# Patient Record
Sex: Male | Born: 1955 | Race: White | Hispanic: No | Smoking: Former smoker
Health system: Southern US, Community
[De-identification: ages and names within clinical notes are randomized; demographics above are authoritative.]

## PROBLEM LIST (undated history)

## (undated) VITALS — BP 95/65 | HR 109 | Temp 97.7°F | Resp 16 | Ht 70.0 in | Wt 170.0 lb

## (undated) VITALS — BP 120/80 | HR 97 | Temp 97.0°F | Resp 20 | Ht 70.5 in | Wt 166.0 lb

## (undated) DIAGNOSIS — F209 Schizophrenia, unspecified: Secondary | ICD-10-CM

## (undated) DIAGNOSIS — F32A Depression, unspecified: Secondary | ICD-10-CM

## (undated) DIAGNOSIS — F329 Major depressive disorder, single episode, unspecified: Secondary | ICD-10-CM

## (undated) DIAGNOSIS — F1210 Cannabis abuse, uncomplicated: Secondary | ICD-10-CM

## (undated) DIAGNOSIS — F1110 Opioid abuse, uncomplicated: Secondary | ICD-10-CM

## (undated) DIAGNOSIS — R911 Solitary pulmonary nodule: Secondary | ICD-10-CM

## (undated) DIAGNOSIS — F319 Bipolar disorder, unspecified: Secondary | ICD-10-CM

## (undated) HISTORY — DX: Solitary pulmonary nodule: R91.1

## (undated) HISTORY — PX: NO PAST SURGERIES: SHX2092

## (undated) HISTORY — PX: WRIST SURGERY: SHX841

---

## 2001-07-10 ENCOUNTER — Emergency Department (HOSPITAL_COMMUNITY): Admission: EM | Admit: 2001-07-10 | Discharge: 2001-07-10 | Payer: Self-pay | Admitting: *Deleted

## 2001-08-20 ENCOUNTER — Emergency Department (HOSPITAL_COMMUNITY): Admission: EM | Admit: 2001-08-20 | Discharge: 2001-08-20 | Payer: Self-pay | Admitting: Emergency Medicine

## 2003-02-11 ENCOUNTER — Emergency Department (HOSPITAL_COMMUNITY): Admission: EM | Admit: 2003-02-11 | Discharge: 2003-02-11 | Payer: Self-pay | Admitting: Emergency Medicine

## 2003-02-20 ENCOUNTER — Emergency Department (HOSPITAL_COMMUNITY): Admission: EM | Admit: 2003-02-20 | Discharge: 2003-02-20 | Payer: Self-pay | Admitting: Emergency Medicine

## 2003-11-01 ENCOUNTER — Inpatient Hospital Stay (HOSPITAL_COMMUNITY): Admission: EM | Admit: 2003-11-01 | Discharge: 2003-11-04 | Payer: Self-pay | Admitting: Psychiatry

## 2003-11-07 ENCOUNTER — Emergency Department (HOSPITAL_COMMUNITY): Admission: EM | Admit: 2003-11-07 | Discharge: 2003-11-07 | Payer: Self-pay | Admitting: Internal Medicine

## 2004-01-15 ENCOUNTER — Ambulatory Visit (HOSPITAL_COMMUNITY): Admission: RE | Admit: 2004-01-15 | Discharge: 2004-01-15 | Payer: Self-pay | Admitting: Family Medicine

## 2004-01-28 ENCOUNTER — Ambulatory Visit (HOSPITAL_COMMUNITY): Admission: RE | Admit: 2004-01-28 | Discharge: 2004-01-28 | Payer: Self-pay | Admitting: Family Medicine

## 2004-05-02 ENCOUNTER — Emergency Department (HOSPITAL_COMMUNITY): Admission: EM | Admit: 2004-05-02 | Discharge: 2004-05-03 | Payer: Self-pay | Admitting: Emergency Medicine

## 2004-05-07 ENCOUNTER — Emergency Department (HOSPITAL_COMMUNITY): Admission: EM | Admit: 2004-05-07 | Discharge: 2004-05-07 | Payer: Self-pay | Admitting: Emergency Medicine

## 2004-05-10 ENCOUNTER — Emergency Department (HOSPITAL_COMMUNITY): Admission: EM | Admit: 2004-05-10 | Discharge: 2004-05-10 | Payer: Self-pay

## 2004-05-14 ENCOUNTER — Emergency Department (HOSPITAL_COMMUNITY): Admission: EM | Admit: 2004-05-14 | Discharge: 2004-05-14 | Payer: Self-pay | Admitting: Emergency Medicine

## 2004-07-18 ENCOUNTER — Emergency Department (HOSPITAL_COMMUNITY): Admission: EM | Admit: 2004-07-18 | Discharge: 2004-07-19 | Payer: Self-pay | Admitting: Emergency Medicine

## 2004-07-21 ENCOUNTER — Inpatient Hospital Stay (HOSPITAL_COMMUNITY): Admission: RE | Admit: 2004-07-21 | Discharge: 2004-07-26 | Payer: Self-pay | Admitting: Psychiatry

## 2004-07-21 ENCOUNTER — Ambulatory Visit: Payer: Self-pay | Admitting: Psychiatry

## 2004-09-16 ENCOUNTER — Ambulatory Visit: Payer: Self-pay | Admitting: Family Medicine

## 2004-12-15 ENCOUNTER — Ambulatory Visit: Payer: Self-pay | Admitting: Family Medicine

## 2004-12-16 ENCOUNTER — Ambulatory Visit: Payer: Self-pay | Admitting: Psychiatry

## 2004-12-16 ENCOUNTER — Inpatient Hospital Stay (HOSPITAL_COMMUNITY): Admission: EM | Admit: 2004-12-16 | Discharge: 2004-12-21 | Payer: Self-pay | Admitting: Psychiatry

## 2005-01-05 ENCOUNTER — Encounter: Payer: Self-pay | Admitting: Emergency Medicine

## 2005-01-05 ENCOUNTER — Inpatient Hospital Stay (HOSPITAL_COMMUNITY): Admission: AD | Admit: 2005-01-05 | Discharge: 2005-01-10 | Payer: Self-pay | Admitting: Psychiatry

## 2005-01-12 ENCOUNTER — Ambulatory Visit: Payer: Self-pay | Admitting: Family Medicine

## 2005-02-17 ENCOUNTER — Ambulatory Visit: Payer: Self-pay | Admitting: Family Medicine

## 2005-02-20 ENCOUNTER — Emergency Department (HOSPITAL_COMMUNITY): Admission: EM | Admit: 2005-02-20 | Discharge: 2005-02-20 | Payer: Self-pay | Admitting: Emergency Medicine

## 2005-03-07 ENCOUNTER — Ambulatory Visit: Payer: Self-pay | Admitting: Family Medicine

## 2005-03-10 ENCOUNTER — Emergency Department (HOSPITAL_COMMUNITY): Admission: EM | Admit: 2005-03-10 | Discharge: 2005-03-11 | Payer: Self-pay | Admitting: Emergency Medicine

## 2005-04-16 ENCOUNTER — Emergency Department (HOSPITAL_COMMUNITY): Admission: EM | Admit: 2005-04-16 | Discharge: 2005-04-16 | Payer: Self-pay | Admitting: *Deleted

## 2005-06-01 ENCOUNTER — Ambulatory Visit: Payer: Self-pay | Admitting: Family Medicine

## 2005-07-30 ENCOUNTER — Emergency Department (HOSPITAL_COMMUNITY): Admission: EM | Admit: 2005-07-30 | Discharge: 2005-07-30 | Payer: Self-pay | Admitting: Emergency Medicine

## 2005-08-01 ENCOUNTER — Ambulatory Visit: Payer: Self-pay | Admitting: Family Medicine

## 2005-08-10 ENCOUNTER — Emergency Department (HOSPITAL_COMMUNITY): Admission: EM | Admit: 2005-08-10 | Discharge: 2005-08-10 | Payer: Self-pay | Admitting: Emergency Medicine

## 2005-08-11 ENCOUNTER — Emergency Department (HOSPITAL_COMMUNITY): Admission: EM | Admit: 2005-08-11 | Discharge: 2005-08-11 | Payer: Self-pay | Admitting: Emergency Medicine

## 2005-08-19 ENCOUNTER — Emergency Department (HOSPITAL_COMMUNITY): Admission: EM | Admit: 2005-08-19 | Discharge: 2005-08-20 | Payer: Self-pay | Admitting: Emergency Medicine

## 2005-09-30 ENCOUNTER — Ambulatory Visit: Payer: Self-pay | Admitting: Family Medicine

## 2005-11-02 ENCOUNTER — Inpatient Hospital Stay (HOSPITAL_COMMUNITY): Admission: AD | Admit: 2005-11-02 | Discharge: 2005-11-04 | Payer: Self-pay | Admitting: Psychiatry

## 2005-11-02 ENCOUNTER — Ambulatory Visit: Payer: Self-pay | Admitting: Psychiatry

## 2005-11-30 ENCOUNTER — Emergency Department (HOSPITAL_COMMUNITY): Admission: EM | Admit: 2005-11-30 | Discharge: 2005-11-30 | Payer: Self-pay | Admitting: Emergency Medicine

## 2006-12-18 ENCOUNTER — Ambulatory Visit (HOSPITAL_COMMUNITY): Payer: Self-pay | Admitting: Psychiatry

## 2007-01-05 ENCOUNTER — Ambulatory Visit (HOSPITAL_COMMUNITY): Payer: Self-pay | Admitting: Psychiatry

## 2007-02-02 ENCOUNTER — Ambulatory Visit (HOSPITAL_COMMUNITY): Payer: Self-pay | Admitting: Psychiatry

## 2007-04-04 ENCOUNTER — Ambulatory Visit (HOSPITAL_COMMUNITY): Payer: Self-pay | Admitting: Psychiatry

## 2007-06-08 ENCOUNTER — Ambulatory Visit (HOSPITAL_COMMUNITY): Payer: Self-pay | Admitting: Psychiatry

## 2007-08-08 ENCOUNTER — Ambulatory Visit (HOSPITAL_COMMUNITY): Payer: Self-pay | Admitting: Psychiatry

## 2007-08-15 ENCOUNTER — Ambulatory Visit (HOSPITAL_COMMUNITY): Payer: Self-pay | Admitting: Licensed Clinical Social Worker

## 2007-08-29 ENCOUNTER — Ambulatory Visit (HOSPITAL_COMMUNITY): Payer: Self-pay | Admitting: Licensed Clinical Social Worker

## 2007-09-07 ENCOUNTER — Ambulatory Visit (HOSPITAL_COMMUNITY): Payer: Self-pay | Admitting: Psychiatry

## 2007-11-16 ENCOUNTER — Ambulatory Visit (HOSPITAL_COMMUNITY): Payer: Self-pay | Admitting: Psychiatry

## 2007-11-26 ENCOUNTER — Ambulatory Visit (HOSPITAL_COMMUNITY): Payer: Self-pay | Admitting: Psychiatry

## 2007-12-28 ENCOUNTER — Ambulatory Visit (HOSPITAL_COMMUNITY): Payer: Self-pay | Admitting: Psychiatry

## 2008-02-18 ENCOUNTER — Ambulatory Visit (HOSPITAL_COMMUNITY): Payer: Self-pay | Admitting: Psychiatry

## 2008-04-16 ENCOUNTER — Ambulatory Visit (HOSPITAL_COMMUNITY): Payer: Self-pay | Admitting: Psychiatry

## 2008-06-16 ENCOUNTER — Ambulatory Visit (HOSPITAL_COMMUNITY): Payer: Self-pay | Admitting: Psychiatry

## 2008-06-23 ENCOUNTER — Ambulatory Visit (HOSPITAL_COMMUNITY): Payer: Self-pay | Admitting: Psychiatry

## 2008-07-07 ENCOUNTER — Ambulatory Visit (HOSPITAL_COMMUNITY): Payer: Self-pay | Admitting: Licensed Clinical Social Worker

## 2008-07-16 ENCOUNTER — Ambulatory Visit (HOSPITAL_COMMUNITY): Payer: Self-pay | Admitting: Psychiatry

## 2008-08-06 ENCOUNTER — Ambulatory Visit (HOSPITAL_COMMUNITY): Payer: Self-pay | Admitting: Licensed Clinical Social Worker

## 2008-08-27 ENCOUNTER — Ambulatory Visit (HOSPITAL_COMMUNITY): Payer: Self-pay | Admitting: Psychiatry

## 2008-10-24 ENCOUNTER — Ambulatory Visit: Payer: Self-pay | Admitting: *Deleted

## 2008-10-24 ENCOUNTER — Ambulatory Visit (HOSPITAL_COMMUNITY): Payer: Self-pay | Admitting: Psychiatry

## 2008-10-24 ENCOUNTER — Inpatient Hospital Stay (HOSPITAL_COMMUNITY): Admission: RE | Admit: 2008-10-24 | Discharge: 2008-10-28 | Payer: Self-pay | Admitting: *Deleted

## 2008-12-10 ENCOUNTER — Ambulatory Visit (HOSPITAL_COMMUNITY): Payer: Self-pay | Admitting: Psychiatry

## 2009-01-12 ENCOUNTER — Ambulatory Visit (HOSPITAL_COMMUNITY): Payer: Self-pay | Admitting: Psychiatry

## 2009-03-11 ENCOUNTER — Ambulatory Visit (HOSPITAL_COMMUNITY): Payer: Self-pay | Admitting: Psychiatry

## 2009-05-08 ENCOUNTER — Ambulatory Visit (HOSPITAL_COMMUNITY): Payer: Self-pay | Admitting: Psychiatry

## 2009-06-18 ENCOUNTER — Ambulatory Visit: Payer: Self-pay | Admitting: Cardiology

## 2009-06-18 ENCOUNTER — Inpatient Hospital Stay (HOSPITAL_COMMUNITY): Admission: EM | Admit: 2009-06-18 | Discharge: 2009-06-22 | Payer: Self-pay | Admitting: Emergency Medicine

## 2009-06-19 ENCOUNTER — Encounter (INDEPENDENT_AMBULATORY_CARE_PROVIDER_SITE_OTHER): Payer: Self-pay | Admitting: Internal Medicine

## 2009-06-20 ENCOUNTER — Ambulatory Visit: Payer: Self-pay | Admitting: Psychiatry

## 2009-06-21 ENCOUNTER — Other Ambulatory Visit: Payer: Self-pay | Admitting: Internal Medicine

## 2009-07-24 ENCOUNTER — Ambulatory Visit (HOSPITAL_COMMUNITY): Payer: Self-pay | Admitting: Psychiatry

## 2009-09-21 ENCOUNTER — Ambulatory Visit (HOSPITAL_COMMUNITY): Payer: Self-pay | Admitting: Psychiatry

## 2009-09-22 ENCOUNTER — Encounter: Payer: Self-pay | Admitting: Family Medicine

## 2009-10-26 ENCOUNTER — Ambulatory Visit (HOSPITAL_COMMUNITY): Payer: Self-pay | Admitting: Psychiatry

## 2009-11-11 ENCOUNTER — Ambulatory Visit (HOSPITAL_COMMUNITY): Payer: Self-pay | Admitting: Psychiatry

## 2009-12-07 ENCOUNTER — Ambulatory Visit (HOSPITAL_COMMUNITY): Payer: Self-pay | Admitting: Psychiatry

## 2010-01-06 ENCOUNTER — Ambulatory Visit (HOSPITAL_COMMUNITY): Payer: Self-pay | Admitting: Psychiatry

## 2010-01-07 ENCOUNTER — Emergency Department (HOSPITAL_COMMUNITY): Admission: EM | Admit: 2010-01-07 | Discharge: 2010-01-07 | Payer: Self-pay | Admitting: Emergency Medicine

## 2010-02-08 ENCOUNTER — Ambulatory Visit (HOSPITAL_COMMUNITY): Payer: Self-pay | Admitting: Psychiatry

## 2010-03-08 ENCOUNTER — Ambulatory Visit (HOSPITAL_COMMUNITY): Payer: Self-pay | Admitting: Psychiatry

## 2010-04-26 ENCOUNTER — Ambulatory Visit (HOSPITAL_COMMUNITY): Payer: Self-pay | Admitting: Psychiatry

## 2010-06-17 ENCOUNTER — Emergency Department (HOSPITAL_COMMUNITY): Admission: EM | Admit: 2010-06-17 | Discharge: 2010-06-17 | Payer: Self-pay | Admitting: Emergency Medicine

## 2010-06-19 ENCOUNTER — Emergency Department (HOSPITAL_COMMUNITY): Admission: EM | Admit: 2010-06-19 | Discharge: 2010-06-19 | Payer: Self-pay | Admitting: Emergency Medicine

## 2010-09-04 ENCOUNTER — Emergency Department (HOSPITAL_COMMUNITY)
Admission: EM | Admit: 2010-09-04 | Discharge: 2010-09-04 | Payer: Self-pay | Source: Home / Self Care | Admitting: Emergency Medicine

## 2010-11-02 NOTE — Letter (Signed)
Summary: Historic Patient File  Historic Patient File   Imported By: Rudene Anda 09/22/2009 10:09:26  _____________________________________________________________________  External Attachment:    Type:   Image     Comment:   External Document

## 2010-12-13 LAB — BASIC METABOLIC PANEL
BUN: 9 mg/dL (ref 6–23)
CO2: 27 mEq/L (ref 19–32)
Calcium: 9.1 mg/dL (ref 8.4–10.5)
Chloride: 101 mEq/L (ref 96–112)
Creatinine, Ser: 0.91 mg/dL (ref 0.4–1.5)
GFR calc Af Amer: >60 mL/min (ref >60)
GFR calc non Af Amer: >60 mL/min (ref >60)
Glucose, Bld: 111 mg/dL — ABNORMAL HIGH (ref 70–99)
Potassium: 3.2 mEq/L — ABNORMAL LOW (ref 3.5–5.1)
Sodium: 136 mEq/L (ref 135–145)

## 2010-12-13 LAB — DIFFERENTIAL
Basophils Absolute: 0 10*3/uL (ref 0.0–0.1)
Basophils Relative: 0 % (ref 0–1)
Eosinophils Absolute: 0.1 10*3/uL (ref 0.0–0.7)
Eosinophils Relative: 1 % (ref 0–5)
Lymphocytes Relative: 26 % (ref 12–46)
Lymphs Abs: 2.7 10*3/uL (ref 0.7–4.0)
Monocytes Absolute: 0.6 10*3/uL (ref 0.1–1.0)
Monocytes Relative: 6 % (ref 3–12)
Neutro Abs: 7.2 10*3/uL (ref 1.7–7.7)
Neutrophils Relative %: 68 % (ref 43–77)

## 2010-12-13 LAB — CBC
HCT: 42.8 % (ref 39.0–52.0)
Hemoglobin: 15.4 g/dL (ref 13.0–17.0)
MCH: 33.1 pg (ref 26.0–34.0)
MCHC: 36 g/dL (ref 30.0–36.0)
MCV: 92 fL (ref 78.0–100.0)
Platelets: 261 10*3/uL (ref 150–400)
RBC: 4.65 MIL/uL (ref 4.22–5.81)
RDW: 13.2 % (ref 11.5–15.5)
WBC: 10.6 10*3/uL — ABNORMAL HIGH (ref 4.0–10.5)

## 2010-12-13 LAB — POCT CARDIAC MARKERS
CKMB, poc: <1 ng/mL — ABNORMAL LOW (ref 1.0–8.0)
Myoglobin, poc: 66.1 ng/mL (ref 12–200)
Troponin i, poc: <0.05 ng/mL (ref 0.00–0.09)

## 2010-12-22 LAB — DIFFERENTIAL
Basophils Absolute: 0 10*3/uL (ref 0.0–0.1)
Basophils Relative: 0 % (ref 0–1)
Eosinophils Absolute: 0.2 10*3/uL (ref 0.0–0.7)
Eosinophils Relative: 3 % (ref 0–5)
Lymphocytes Relative: 30 % (ref 12–46)
Lymphs Abs: 1.7 10*3/uL (ref 0.7–4.0)
Monocytes Absolute: 0.4 10*3/uL (ref 0.1–1.0)
Monocytes Relative: 7 % (ref 3–12)
Neutro Abs: 3.4 10*3/uL (ref 1.7–7.7)
Neutrophils Relative %: 60 % (ref 43–77)

## 2010-12-22 LAB — CBC
HCT: 42.8 % (ref 39.0–52.0)
Hemoglobin: 14.9 g/dL (ref 13.0–17.0)
MCHC: 34.8 g/dL (ref 30.0–36.0)
MCV: 93.7 fL (ref 78.0–100.0)
Platelets: 159 10*3/uL (ref 150–400)
RBC: 4.57 MIL/uL (ref 4.22–5.81)
RDW: 13.4 % (ref 11.5–15.5)
WBC: 5.8 10*3/uL (ref 4.0–10.5)

## 2010-12-22 LAB — POCT CARDIAC MARKERS
CKMB, poc: <1 ng/mL — ABNORMAL LOW (ref 1.0–8.0)
Myoglobin, poc: 67.3 ng/mL (ref 12–200)
Troponin i, poc: <0.05 ng/mL (ref 0.00–0.09)

## 2010-12-22 LAB — BASIC METABOLIC PANEL
BUN: 10 mg/dL (ref 6–23)
CO2: 29 mEq/L (ref 19–32)
Calcium: 8.7 mg/dL (ref 8.4–10.5)
Chloride: 103 mEq/L (ref 96–112)
Creatinine, Ser: 0.92 mg/dL (ref 0.4–1.5)
GFR calc Af Amer: >60 mL/min (ref >60)
GFR calc non Af Amer: >60 mL/min (ref >60)
Glucose, Bld: 89 mg/dL (ref 70–99)
Potassium: 4 mEq/L (ref 3.5–5.1)
Sodium: 138 mEq/L (ref 135–145)

## 2010-12-22 LAB — D-DIMER, QUANTITATIVE: D-Dimer, Quant: 0.26 ug/mL-FEU (ref 0.00–0.48)

## 2011-01-07 LAB — CBC
HCT: 42.2 % (ref 39.0–52.0)
HCT: 45.4 % (ref 39.0–52.0)
HCT: 46 % (ref 39.0–52.0)
Hemoglobin: 14.5 g/dL (ref 13.0–17.0)
Hemoglobin: 15.9 g/dL (ref 13.0–17.0)
Hemoglobin: 15.7 g/dL (ref 13.0–17.0)
MCHC: 34.4 g/dL (ref 30.0–36.0)
MCHC: 34.9 g/dL (ref 30.0–36.0)
MCHC: 34.2 g/dL (ref 30.0–36.0)
MCV: 95.9 fL (ref 78.0–100.0)
MCV: 97 fL (ref 78.0–100.0)
MCV: 97.2 fL (ref 78.0–100.0)
Platelets: 167 10*3/uL (ref 150–400)
Platelets: 178 10*3/uL (ref 150–400)
Platelets: 165 10*3/uL (ref 150–400)
RBC: 4.35 MIL/uL (ref 4.22–5.81)
RBC: 4.74 MIL/uL (ref 4.22–5.81)
RBC: 4.73 MIL/uL (ref 4.22–5.81)
RDW: 12.8 % (ref 11.5–15.5)
RDW: 13.1 % (ref 11.5–15.5)
RDW: 13 % (ref 11.5–15.5)
WBC: 6.7 10*3/uL (ref 4.0–10.5)
WBC: 8.5 10*3/uL (ref 4.0–10.5)
WBC: 5.9 10*3/uL (ref 4.0–10.5)

## 2011-01-07 LAB — COMPREHENSIVE METABOLIC PANEL
ALT: 10 U/L (ref 0–53)
ALT: 12 U/L (ref 0–53)
AST: 15 U/L (ref 0–37)
AST: 18 U/L (ref 0–37)
Albumin: 3.5 g/dL (ref 3.5–5.2)
Albumin: 4.1 g/dL (ref 3.5–5.2)
Alkaline Phosphatase: 53 U/L (ref 39–117)
Alkaline Phosphatase: 58 U/L (ref 39–117)
BUN: 11 mg/dL (ref 6–23)
BUN: 12 mg/dL (ref 6–23)
CO2: 29 mEq/L (ref 19–32)
CO2: 29 mEq/L (ref 19–32)
Calcium: 8.8 mg/dL (ref 8.4–10.5)
Calcium: 9.2 mg/dL (ref 8.4–10.5)
Chloride: 101 mEq/L (ref 96–112)
Chloride: 101 mEq/L (ref 96–112)
Creatinine, Ser: 1.03 mg/dL (ref 0.4–1.5)
Creatinine, Ser: 1.09 mg/dL (ref 0.4–1.5)
GFR calc Af Amer: >60 mL/min (ref >60)
GFR calc Af Amer: >60 mL/min (ref >60)
GFR calc non Af Amer: >60 mL/min (ref >60)
GFR calc non Af Amer: >60 mL/min (ref >60)
Glucose, Bld: 114 mg/dL — ABNORMAL HIGH (ref 70–99)
Glucose, Bld: 94 mg/dL (ref 70–99)
Potassium: 3.6 mEq/L (ref 3.5–5.1)
Potassium: 4 mEq/L (ref 3.5–5.1)
Sodium: 135 mEq/L (ref 135–145)
Sodium: 138 mEq/L (ref 135–145)
Total Bilirubin: 1.7 mg/dL — ABNORMAL HIGH (ref 0.3–1.2)
Total Bilirubin: 1.8 mg/dL — ABNORMAL HIGH (ref 0.3–1.2)
Total Protein: 6.1 g/dL (ref 6.0–8.3)
Total Protein: 6.9 g/dL (ref 6.0–8.3)

## 2011-01-07 LAB — BASIC METABOLIC PANEL
BUN: 14 mg/dL (ref 6–23)
CO2: 28 mEq/L (ref 19–32)
Calcium: 9.3 mg/dL (ref 8.4–10.5)
Chloride: 101 mEq/L (ref 96–112)
Creatinine, Ser: 1.06 mg/dL (ref 0.4–1.5)
GFR calc Af Amer: >60 mL/min (ref >60)
GFR calc non Af Amer: >60 mL/min (ref >60)
Glucose, Bld: 99 mg/dL (ref 70–99)
Potassium: 3.7 mEq/L (ref 3.5–5.1)
Sodium: 137 mEq/L (ref 135–145)

## 2011-01-07 LAB — CARDIAC PANEL(CRET KIN+CKTOT+MB+TROPI)
CK, MB: 2.1 ng/mL (ref 0.3–4.0)
CK, MB: 2.8 ng/mL (ref 0.3–4.0)
CK, MB: 3.8 ng/mL (ref 0.3–4.0)
Relative Index: 1.2 (ref 0.0–2.5)
Relative Index: 1.5 (ref 0.0–2.5)
Relative Index: 1.8 (ref 0.0–2.5)
Total CK: 177 U/L (ref 7–232)
Total CK: 192 U/L (ref 7–232)
Total CK: 216 U/L (ref 7–232)
Troponin I: 0.01 ng/mL (ref 0.00–0.06)
Troponin I: 0.03 ng/mL (ref 0.00–0.06)
Troponin I: 0.03 ng/mL (ref 0.00–0.06)

## 2011-01-07 LAB — RAPID URINE DRUG SCREEN, HOSP PERFORMED
Amphetamines: NOT DETECTED
Barbiturates: NOT DETECTED
Benzodiazepines: NOT DETECTED
Cocaine: NOT DETECTED
Opiates: POSITIVE — AB
Tetrahydrocannabinol: POSITIVE — AB

## 2011-01-07 LAB — POCT CARDIAC MARKERS
CKMB, poc: 2.1 ng/mL (ref 1.0–8.0)
CKMB, poc: 2.6 ng/mL (ref 1.0–8.0)
Myoglobin, poc: 158 ng/mL (ref 12–200)
Myoglobin, poc: 290 ng/mL (ref 12–200)
Troponin i, poc: <0.05 ng/mL (ref 0.00–0.09)
Troponin i, poc: <0.05 ng/mL (ref 0.00–0.09)

## 2011-01-07 LAB — DIFFERENTIAL
Basophils Absolute: 0 10*3/uL (ref 0.0–0.1)
Basophils Absolute: 0 10*3/uL (ref 0.0–0.1)
Basophils Relative: 0 % (ref 0–1)
Basophils Relative: 1 % (ref 0–1)
Eosinophils Absolute: 0.1 10*3/uL (ref 0.0–0.7)
Eosinophils Absolute: 0.2 10*3/uL (ref 0.0–0.7)
Eosinophils Relative: 1 % (ref 0–5)
Eosinophils Relative: 2 % (ref 0–5)
Lymphocytes Relative: 20 % (ref 12–46)
Lymphocytes Relative: 41 % (ref 12–46)
Lymphs Abs: 1.7 10*3/uL (ref 0.7–4.0)
Lymphs Abs: 2.7 10*3/uL (ref 0.7–4.0)
Monocytes Absolute: 0.5 10*3/uL (ref 0.1–1.0)
Monocytes Absolute: 0.5 10*3/uL (ref 0.1–1.0)
Monocytes Relative: 6 % (ref 3–12)
Monocytes Relative: 7 % (ref 3–12)
Neutro Abs: 3.2 10*3/uL (ref 1.7–7.7)
Neutro Abs: 6.2 10*3/uL (ref 1.7–7.7)
Neutrophils Relative %: 49 % (ref 43–77)
Neutrophils Relative %: 74 % (ref 43–77)

## 2011-01-07 LAB — PHOSPHORUS: Phosphorus: 3.8 mg/dL (ref 2.3–4.6)

## 2011-01-07 LAB — SEDIMENTATION RATE: Sed Rate: 1 mm/hr (ref 0–16)

## 2011-01-07 LAB — MAGNESIUM: Magnesium: 2.1 mg/dL (ref 1.5–2.5)

## 2011-01-07 LAB — TSH: TSH: 1.84 u[IU]/mL (ref 0.350–4.500)

## 2011-01-17 LAB — DRUGS OF ABUSE SCREEN W/O ALC, ROUTINE URINE
Amphetamine Screen, Ur: NEGATIVE
Barbiturate Quant, Ur: NEGATIVE
Benzodiazepines.: NEGATIVE
Cocaine Metabolites: NEGATIVE
Creatinine,U: 36.5 mg/dL
Marijuana Metabolite: NEGATIVE
Methadone: NEGATIVE
Opiate Screen, Urine: NEGATIVE
Phencyclidine (PCP): NEGATIVE
Propoxyphene: NEGATIVE

## 2011-01-17 LAB — CBC
HCT: 39.8 % (ref 39.0–52.0)
Hemoglobin: 13.4 g/dL (ref 13.0–17.0)
MCHC: 33.6 g/dL (ref 30.0–36.0)
MCV: 96.1 fL (ref 78.0–100.0)
Platelets: 174 10*3/uL (ref 150–400)
RBC: 4.15 MIL/uL — ABNORMAL LOW (ref 4.22–5.81)
RDW: 13.5 % (ref 11.5–15.5)
WBC: 6.2 10*3/uL (ref 4.0–10.5)

## 2011-01-17 LAB — COMPREHENSIVE METABOLIC PANEL
ALT: 10 U/L (ref 0–53)
AST: 13 U/L (ref 0–37)
Albumin: 3.7 g/dL (ref 3.5–5.2)
Alkaline Phosphatase: 54 U/L (ref 39–117)
BUN: 12 mg/dL (ref 6–23)
CO2: 30 mEq/L (ref 19–32)
Calcium: 9.1 mg/dL (ref 8.4–10.5)
Chloride: 103 mEq/L (ref 96–112)
Creatinine, Ser: 1.01 mg/dL (ref 0.4–1.5)
GFR calc Af Amer: >60 mL/min (ref >60)
GFR calc non Af Amer: >60 mL/min (ref >60)
Glucose, Bld: 91 mg/dL (ref 70–99)
Potassium: 3.8 mEq/L (ref 3.5–5.1)
Sodium: 140 mEq/L (ref 135–145)
Total Bilirubin: 0.8 mg/dL (ref 0.3–1.2)
Total Protein: 6.3 g/dL (ref 6.0–8.3)

## 2011-02-07 ENCOUNTER — Emergency Department (HOSPITAL_COMMUNITY)
Admission: EM | Admit: 2011-02-07 | Discharge: 2011-02-07 | Disposition: A | Discharge status: Home / Self Care | Payer: Medicare Other | Attending: Emergency Medicine | Admitting: Emergency Medicine

## 2011-02-07 DIAGNOSIS — R51 Headache: Secondary | ICD-10-CM | POA: Insufficient documentation

## 2011-02-07 DIAGNOSIS — L02818 Cutaneous abscess of other sites: Secondary | ICD-10-CM | POA: Insufficient documentation

## 2011-02-07 DIAGNOSIS — L03818 Cellulitis of other sites: Secondary | ICD-10-CM | POA: Insufficient documentation

## 2011-02-15 NOTE — Discharge Summary (Signed)
NAME:  Bruce Goodman, Bruce Goodman NO.:  [id number]   MEDICAL RECORD NO.:  [id number]          PATIENT TYPE:  IPS   LOCATION:  0307                          FACILITY:  [facility]   PHYSICIAN:  Jasmine Pang, M.D. DATE OF BIRTH:  20-Apr-1956   DATE OF ADMISSION:  10/24/2008  DATE OF DISCHARGE:  10/28/2008                               DISCHARGE SUMMARY   IDENTIFICATION:  This is a 55 year old divorced white male who was  admitted to my service on October 24, 2008.   HISTORY OF PRESENT ILLNESS:  The patient appears much older than his  stated age due to being edentulous and non-compensated.  He was admitted  due to suicidal thoughts.  Apparently, he is followed by Dr. Lolly Mustache.  He  admitted thoughts of committing suicide.  He has been unusually nervous.  He reports a recent weight loss of 30 pounds over the past 3 months with  poor appetite.  His youngest daughter is recently out of jail, but still  in trouble over using drugs.  He is very worried about her.  He has no  social support.  He lives with his ex-wife that he is unhappy there.  He  has been crying and is depressed.   PAST PSYCHIATRIC HISTORY:  The patient was last with [place] in 2007.  About  the same time, he was admitted on November 02, 2005, and discharged on  November 04, 2005.  At that time, he was having similar issues.  He  reports that he was first committed to [facility] at age 26, then he had  various diagnoses including schizophrenia, bipolar, and cannabis abuse  for the years.  He has been followed by Dr. Lolly Mustache at the [facility] for the past year.   FAMILY HISTORY:  Apparently, his father was an alcoholic.   ALCOHOL AND DRUG HISTORY:  He denies marijuana in the past 6 months.   MEDICAL PROBLEMS:  He denies any, other than a recent tremor in his left  hand.   MEDICATIONS:  Other than medications prescribed by Dr. Lolly Mustache, he  denies.   DRUG ALLERGIES:  No known drug  allergies.   PHYSICAL FINDINGS:  There were no acute physical or medical problems  noted.  It was noted to have a tremor of his left hand.  He reports  decrease in weight due to loss of appetite over the couple of months.  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   HOSPITAL COURSE:  Upon admission, the patient was started on Seroquel  400 mg p.o. q.h.s., Haldol 5 mg p.o. q.h.s., and Cogentin 1 mg p.o.  q.h.s.  He was also started on Seroquel 50 mg p.o. q.4 h. p.r.n. anxiety  or agitation.  In individual sessions with me, the patient was friendly  and cooperative, but quite depressed.  There was psychomotor  retardation.  Speech was soft and slow.  Mood was depressed and anxious  with consistent affect.  He stated he has been diagnosed as  schizophrenic and also bipolar disorder in the past when  he was at  [facility].  He states he used to use marijuana frequently, not during the  past 6 months.  He states he was hearing voices when he came here  jumbled voices.  On October 26, 2008, he discussed his anxiety and his  concern about his daughters drug abuse. His oldest daughter is pregnant  and she is very supportive.  I started Celexa 20 mg p.o. q.h.s. for his  depression and anxiety.  On October 27, 2008, mood had improved.  He was  less depressed and less anxious.  There was no suicidal ideation.  He  began to talk about wanting to go home the following day.  On October 28, 2008, sleep was good and appetite was good.  Mood was less depressed  and less anxious.  Affect consistent with mood, much wider range.  There  was no suicidal or homicidal ideation.  No thoughts of self-injurious  behavior.  No auditory or visual hallucinations.  No paranoia or  delusions.  Thoughts were logical and goal-directed.  Thought content,  no predominant theme.  Cognitive was grossly intact.  Insight was good.  Judgment was good.  Impulse control was good.  It was felt, the patient   was ready for discharge today.   DISCHARGE DIAGNOSES:  Axis I:  Mood disorder not otherwise specified.  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  Axis II:  None.  Axis III:  None.  Axis IV:  Severe (issues with family and social support, burden of  psychiatric illness).  Axis V:  Global assessment of functioning was 50 on discharge.  GAF was  40 upon admission.  GAF highest past year was 60 to 65.      Jasmine Pang, M.D.  Electronically Signed     BHS/MEDQ  D:  11/02/2008  T:  11/03/2008  Job:  [id number]
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
## 2011-02-18 NOTE — Discharge Summary (Signed)
NAMEMAXI, RODAS NO.:  [id number]   MEDICAL RECORD NO.:  [id number]          PATIENT TYPE:  IPS   LOCATION:  0504                          FACILITY:  [facility]   PHYSICIAN:  Geoffery Lyons, M.D.      DATE OF BIRTH:  1955-11-15   DATE OF ADMISSION:  07/21/2004  DATE OF DISCHARGE:  07/26/2004                                 DISCHARGE SUMMARY   CHIEF COMPLAINT AND PRESENT ILLNESS:  This was the third admission to [facility] for this 55 year old married white male voluntarily  admitted.  History of psychosis, more severe the last two or three months,  hearing voices telling him to hurt himself.  There was a voice telling him  to drive 161 mph.  There was a history of self-inflicted injury, using a  knife to cut himself.  Positive for visual hallucinations, the devil  taunting him.  No homicidal ideation.  Decreased sleep.  Stressors are two  children, one in prison and the other using crack, a lot of financial  difficulties, difficulty affording medications.   PAST PSYCHIATRIC HISTORY:  Third admission to [facility].  Sees Dr. Waunita Schooner.   ALCOHOL/DRUG HISTORY:  Denies the use of alcohol.  Admits use of marijuana.   MEDICAL HISTORY:  Back and neck pain.   MEDICATIONS:  Xanax four times a day, Effexor, Tylox three times a day and  Flexeril at bedtime.   PHYSICAL EXAMINATION:  Performed and failed to show any acute findings.Marland Kitchen   LABORATORY DATA:  Blood chemistry within normal limits.  Liver profile  within normal limits.  TSH 2.455.  Drug screen positive for opioids,  positive for benzodiazepines and positive for marijuana.   MENTAL STATUS EXAM:  Alert, oriented, cooperative male.  Casually dressed.  Fair eye contact.  Speech clear.  Normal rate, tempo and production.  Mood  depressed.  Affect flat, anxious.  Positive for auditory hallucinations,  positive for visual hallucinations.  Cognition was well-preserved.   ADMISSION  DIAGNOSES:   AXIS I:  1.  Schizoaffective disorder.  2.  Marijuana abuse.   AXIS II:  No diagnosis.   AXIS III:  Chronic back pain.   AXIS IV:  Moderate.   AXIS V:  Global Assessment of Functioning upon admission 30; highest Global  Assessment of Functioning in the last year 60.   HOSPITAL COURSE:  He was admitted and started in individual and group  psychotherapy.  He was restarted on Effexor 37.5 mg per day, Xanax 1 mg four  times a day, Flexeril 10 mg at night, Tylox 1 three times a day as needed.  Because of acute anxiety, was given Zyprexa Zydis, another one-time dose.  He was started on Neurontin 100 mg three times a day and he was given Ambien  for sleep.  Effexor was discontinued.  He was started on Cymbalta 30 mg per  day.  We started decreasing the Xanax 0.75 mg four times a day, increased  Neurontin to 200 mg three times a day and he was given Risperdal 0.25 mg  twice a day and  0.5 mg at night.  Initially did endorse increased auditory  hallucinations, experiencing the hallucinations for several weeks,  difficulty keeping himself safe while at home.  The voices telling him to  cut himself.  Anxious.  We had increased the Risperdal and he started  responding better to the Risperdal.  He had acute episodes of auditory  hallucinations that were very emotionally painful.  He was not able to  tolerate them.  He was given Haldol and Ativan IM.  He did sleep on this  medications and, overall, felt better on the Haldol than he did on the  Risperdal.  So, we went ahead and switched.  Discontinued the Risperdal.  He  was started on Haldol 2 mg twice a day, pursued the Xanax 0.75 mg and  Cogentin.  Neurontin was increased to 300 mg four times a day.  He was given  Vicoprofen 7.5/200 mg every six hours as needed.  Then, he was switched back  to Percocet 1 three times a day as needed for pain.  On October 24th, he was  in full contact with reality.  There were no suicidal ideation,  no homicidal  ideation, no hallucinations, no delusions.  Felt that the Haldol was really  helping.  Had done really well for 48 hours before discharge.  He felt that  he could take it from here.  He was endorsing no hallucinations.  There was  no evidence of delusions.  No suicidal or homicidal ideation, so we went  ahead and discharged to outpatient follow-up.   DISCHARGE DIAGNOSES:   AXIS I:  Schizoaffective disorder.   AXIS II:  No diagnosis.   AXIS III:  Chronic back pain.   AXIS IV:  Moderate.   AXIS V:  Global Assessment of Functioning upon discharge 50.   DISCHARGE MEDICATIONS:  1.  Flexeril 10 mg at night.  2.  Cymbalta 30 mg in the morning.  3.  Haldol 2 mg twice a day.  4.  Haldol 5 mg at night.  5.  Xanax 0.5 mg, 1-1/2 three times a day and 2 at night.  6.  Neurontin 300 mg five times a day.  7.  Percocet 5/325 mg, 1 every eight hours as needed.  8.  Ambien 10 mg at bedtime for sleep.   FOLLOW UP:  [facility].     Farrel Gordon   IL/MEDQ  D:  08/20/2004  T:  08/21/2004  Job:  [id number]

## 2011-02-18 NOTE — Discharge Summary (Signed)
NAME:  Bruce Goodman, SAVITZ NO.:  [id number]   MEDICAL RECORD NO.:  [id number]          PATIENT TYPE:  IPS   LOCATION:  0401                          FACILITY:  [facility]   PHYSICIAN:  Geoffery Lyons, M.D.      DATE OF BIRTH:  1956-06-25   DATE OF ADMISSION:  11/02/2005  DATE OF DISCHARGE:  11/04/2005                                 DISCHARGE SUMMARY   CHIEF COMPLAINT AND PRESENT ILLNESS:  This was one of several admissions to  [facility] for this 55 year old male with history of  schizophrenia.  Ran out of benzodiazepines between three days ago and a week  ago.  Seems unreliable in his description of taking his other medication.  Has been feeling agitated.  Had thoughts yesterday that he wanted to kill  himself.  Endorsed chronic depressed mood, worse over the past two weeks to  one month, unhappy with his living situation.  Endorsed auditory  hallucinations, voices saying die, die.  Claimed not eating or drinking  regularly.  Endorsed having sleep disruption and getting quite anxious.   PAST PSYCHIATRIC HISTORY:  Being followed by Dr. Waunita Schooner at [facility].  Last time at [facility] in April of 2006.  Past  history of psychotic disorder and cannabis abuse.  Endorsed that, when he  becomes anxious, he increased his cannabis abuse.  This exacerbates his  psychotic symptoms.   ALCOHOL/DRUG HISTORY:  Chronic pain management with opiates.  No evidence of  abuse.  Has been on benzodiazepines for a long time.  History of cannabis  abuse.   PAST MEDICAL HISTORY:  Hypokalemia, replaced, lumbar disk disease,  neuropathy right hand after carpal tunnel surgery.   PHYSICAL EXAMINATION:  Performed and failed to show any acute findings.   LABORATORY DATA:  CBC with white blood cells 5.1, hemoglobin 13.2.  Blood  chemistry with sodium 139, potassium 3.3, glucose 98.  Liver enzymes with  SGOT 15, SGPT 17, TSH 1.22.   MENTAL STATUS EXAM:   Alert, anxious, tearful, jittery male.  Scattered  thinking.  Preoccupied with his pain level.  Answers are short, offers very  little, repeatedly asking for pain medication.  Some moaning.  Complaining  about a lot of pain shooting up his right hand.  Mood was agitated and  anxious.  Thought processes with some thought-blocking, scattered thinking,  highly distractible, somatically focused.  Suicidal ruminations, no active  plan.  No other delusions.  Endorsed hallucinations.  Cognition was well-  preserved.   ADMISSION DIAGNOSES:  AXIS I:  Schizophrenia, undifferentiated.  Rule out  schizoaffective disorder.  Marijuana abuse.  AXIS II:  No diagnosis.  AXIS III:  Lumbar disk disease, chronic pain syndrome and neuropathy.  AXIS IV:  Moderate.  AXIS V:  GAF upon admission 25-30; highest GAF in the last year 55-60.   HOSPITAL COURSE:  He was admitted.  He was started in individual and group  psychotherapy.  He was maintained on the medications that he was taking, the  Xanax 0.5 mg three times a day, Flexeril 10 mg at bedtime,  Seroquel 200 mg  at night.  He was given tramadol 50 mg three times a day as needed.  Xanax  was discontinued.  He was given some Ativan as needed and then switched to  Ativan 1 mg three times a day.  He was given Percocet 10/325 mg, 1 twice a  day.  He was acutely agitated with voices.  He was given Ativan and Haldol.  This was not effective.  He was given Geodon IM and then switched to Geodon  80 mg twice a day.  Endorsed persistent depression, persistent pain.  Endorsed that the pain makes his mood get worse and vice versa.  He claimed  multiple personalities.  Endorsed difficulty controlling his personality.  Endorsed that, without the pain medication or the Ativan, he cannot  function.  Claimed that the anxiety and the pain gets to be incapacitating.  Endorsed auditory hallucinations.  He was placed back on his medication and  he was treated with Geodon.  By  November 03, 2005, he endorsed he was feeling  a little better.  The hallucinations were getting under better control.  He  slept after he got the Geodon shot.  Geodon was placed at 80 mg twice a day.  By November 04, 2005, he was in full contact with reality.  Endorsed no  hallucinations.  Endorsed he was much better.  Pain was under control and he  felt he was ready to go home as he felt he needed to be there as the  caseworker was going to find an apartment for him and, if he was not there,  on this date of discharge, he was going to be placed back at the end of the  list.   DISCHARGE DIAGNOSES:  AXIS I:  Schizoaffective disorder.  Marijuana abuse.  AXIS II:  No diagnosis.  AXIS III:  Lumbar disk disease, chronic pain, neuropathy, hypokalemia,  corrected.  AXIS IV:  Moderate.  AXIS V:  GAF upon discharge 50-55.   DISCHARGE MEDICATIONS:  1.  Flexeril 10 mg at night.  2.  Ativan 1 mg three times a day.  3.  Oxycodone 5/325 mg, 2 tabs twice a day.  4.  Geodon 80 mg twice a day.  5.  Bactrim DS 2 tabs twice a day.   FOLLOW UP:  [facility].      Geoffery Lyons, M.D.  Electronically Signed     IL/MEDQ  D:  11/17/2005  T:  11/17/2005  Job:  [id number]

## 2011-02-18 NOTE — Discharge Summary (Signed)
Bruce Goodman, Bruce Goodman NO.:  [id number]   MEDICAL RECORD NO.:  [id number]          PATIENT TYPE:  IPS   LOCATION:  0505                          FACILITY:  [facility]   PHYSICIAN:  Geoffery Lyons, M.D.      DATE OF BIRTH:  1956/09/14   DATE OF ADMISSION:  12/16/2004  DATE OF DISCHARGE:  12/21/2004                                 DISCHARGE SUMMARY   CHIEF COMPLAINT AND PRESENTING ILLNESS:  This was the 4th or 5th  admission  to [facility]  for this 55 year old white male, married,  voluntarily admitted, presented to emergency department complaining that he  thought that the medication was not working, taking Haldol and Cymbalta.  Hearing voices for many months, worse in the last 3 days.  Stressed by  anticipation of his unpleasant stepson coming home.  Upset because his 15-  year-old stepdaughter who lives in the home has been yelling at him, telling  him to shut up.  Endorses family conflict and marital discord.  Yesterday  after being shouted at by the stepdaughter he reported seeing the devil who  was telling him that he was going to go ahead and take his soul.  Having  auditory hallucinations, hearing the devil's voice.  Anxious about the  hallucinations.   PAST PSYCHIATRIC HISTORY:  This is the 4th or 5th time at [facility], last one being October 2005   PAST MEDICAL HISTORY:  Chronic back pain, neck, wrist pain for which he  takes Tylox 4 times a day.  Surgery on his wrist in 1995.  Prior elevation  of his cholesterol.   MEDICATIONS:  Tylox 1 four times a day, Xanax 1 mg 4 times a day, Haldol 2  mg in the morning, 2 mg after lunch and 2 mg at bedtime, Flexeril 10 mg at  night, Sonata at bedtime, Cymbalta 30 mg at night.   PHYSICAL EXAMINATION:  Performed, failed to show any acute findings.   LABORATORY WORKUP:  CBC within normal limits.  Blood chemistries:  SGOT 11,  SGPT 8, total bilirubin 1.6.  Lipid profile cholesterol 221,  triglycerides  4044.  TSH 3.971.  Valporic acid level 17.6.   MENTAL STATUS EXAM:  Reveals a fully alert male with blunted affect, quite  anxious, some oral buccal movements previously noted.  Speech was normal in  pace and tone, decreasing in amount.  Mood was depressed, anxious.  Thought  processes feeling overwhelmed with the hallucinations, upset about the  situation at home.  No active suicidal or homicidal ideation.  No active  hallucinations.  Cognition well preserved.   ADMISSION DIAGNOSES:   AXIS I:  1.  Schizoaffective disorder.  2.  Marijuana abuse.   AXIS II:  No diagnosis.   AXIS III:  Chronic back pain.   AXIS IV:  Moderate.   AXIS V:  Global assessment of function upon admission 25, highest global  assessment of function in past year 60.   COURSE IN HOSPITAL:  He was admitted and started on individual and group  psychotherapy.  He was given Ambien  for sleep, was given Haldol and Ativan  as needed for agitation.  He was maintained on the Tylox, Cogentin 1 mg  twice a day, Ambien 10 at night for sleep, Flexeril 10 mg at night, Cymbalta  30 mg daily, Depakote ER 500 at night, Xanax 1 mg 3 times a day and at  bedtime, Haldol 2 mg in the morning, 2 in the afternoon and 5 a night.  Haldol was increased to 2.5 twice a day and 5 at night.  He was placed on  Crestor for his high cholesterol and triglycerides.  Endorsed that the  voices became out of control but they started to get better,  He continued  to feel better, endorsed decrease in the auditory hallucinations, was  tolerating the medication well, sleep became more stable, was able to talk  about the stress having to do with the relationship with the stepson and the  stepdaughter, also the conflict with the wife because of the conflict  between him and the children.  Endorsed that they were thinking about  splitting.  On March 20 he said he was starting to feel better.  The stepson  was going to be out of the  house by the time he returned home and this was  reassuring.  March 21 he was in full contact with reality.  There were no  suicidal ideas, no homicidal ideas, no hallucinations, no delusions.  He was  going to leave the hospital, going home.  Overall he was much improved,  endorsing no suicidal or homicidal ideas and no active hallucinations.   DISCHARGE DIAGNOSES:   AXIS I:  1.  Schizoaffective disorder.  2.  Marijuana abuse.   AXIS II:  No diagnosis.   AXIS III:  Chronic back pain.   AXIS IV:  Moderate.   AXIS V:  Global assessment of function upon discharge 55-60.   DISCHARGE MEDICATIONS:  1.  Percocet 5/325 1 4 times a day as needed.  2.  Cogentin 1 mg twice a day.  3.  Flexeril 10 mg one at night.  4.  Cymbalta 30 mg at night.  5.  Depakote ER 500 at night.  6.  Xanax 1 mg 4 times a day.  7.  Haldol 5 one at bedtime, Haldol 5 one-half twice a day.  8.  Crestor 10 mg daily.  9.  Ambien 10 at night for sleep.   DISPOSITION:  Follow up [facility], [facility].    IL/MEDQ  D:  01/12/2005  T:  01/12/2005  Job:  [id number]

## 2011-02-18 NOTE — Discharge Summary (Signed)
Bruce Goodman, Bruce Goodman NO.:  [id number]   MEDICAL RECORD NO.:  [id number]          PATIENT TYPE:  IPS   LOCATION:  0406                          FACILITY:  [facility]   PHYSICIAN:  Jeanice Lim, M.D. DATE OF BIRTH:  [birth year]   DATE OF ADMISSION:  01/05/2005  DATE OF DISCHARGE:  01/10/2005                                 DISCHARGE SUMMARY   This is a 55 year old married white male. He was admitted on April 5, and he  was discharged on April 10. The patient was admitted involuntarily.  Apparently, he has been receiving treatment at [facility] since age 22. He is currently taking a number of medications  including Cymbalta, Abilify, Xanax, Tylox, and Flexeril. He has been  diagnosed as schizophrenic. Recently, he started stumbling, falling of bed,  and has become aggressive. His speech has become slurred, and he started  smoking marijuana. He reports that he has had recurring thoughts to hurt  people. He was noted to have marijuana and benzodiazepines in his urine.  After he was admitted, he was started on his routine medications, Cymbalta  30 mg p.o. q.d., Flexeril 10 mg p.o. q.d., and Ambien at h.s. It was  clarified that he does take Risperdal at 0.5 mg b.i.d. and 0.5 mg at h.s.,  Vicodin one or two q.6h. p.r.n. pain, and Xanax 1 mg q.i.d. Later that  afternoon, he required some Haldol 5 mg p.o. q.6h. and Cogentin 1 mg p.o.  q.6h. On April 7, Flexeril was given 5 mg in the morning, 5 mg at 2 p.m.,  and 10 mg at 8 p.m.; Cogentin 0.5 mg at 2 p.m. and 8 p.m.; Risperdal 0.5 mg  in the a.m., 2 p.m. and 1.5 mg at 8 p.m. Cymbalta was discontinued, and  Celexa was begun 10 mg now and 20 mg on the following day on April 8. On  April 9, the Cogentin was increased to 1 mg in the a.m. and 0.5 at 2 p.m.  and 1 mg at 8 p.m. He was discharged on April 10. During the course of his  hospitalization, on April 8, he was noted to be feeling better. His felt  his  medicines were effective. He had slept two nights. He denied current  auditory hallucinations, and he denied suicidal ideation. There was some EPS  with the Haldol, tongue movements, and so the Cogentin was increased. His  daughter was released from jail, and he wanted to see her. On April 10, it  was felt that he was ready to go home. The wife will pick him up. He was  denying suicidal ideation. He was denying auditory hallucinations. He states  that the tongue movements are less. He has been sleeping well the past three  nights. He had good eye contact. EPS, his tongue was inside his cheek, and  it was felt that he was ready to be going home. Dr. Kathrynn Running checked him  before he left. He was negative for EPS; however, he was positive for buccal  movements less than a year.   DISCHARGE MEDICATIONS:  1.  Risperdal 0.5 mg in the a.m., 2 p.m. and 3 at 8 p.m.; so that would be 1-      1/2.  2.  Celexa 20 mg p.o. q.d.  3.  Xanax 1 mg 9 a.m., 1 p.m., 6 p.m., 10 p.m.  4.  Flexeril 10 mg 1/2 tablet 9 a.m. and 2 p.m. and a whole one at 8 p.m.  5.  Cogentin 1 mg at 9 a.m., 1-1/2 tablet 2 p.m., and 1 whole tablet at 8      p.m.  6.  Vitamin E 400 units b.i.d.   Apparently switching medications had been discussed with the patient as he  was experiencing some oral buccal movements; however, the patient refused.  He liked the medication that he had now. He was to have followup on April 24  at 10 a.m. at [facility] with Judie Bonus.   DISCHARGE AXES:   AXIS I:  Schizophrenia with substance abuse induced exacerbation,  specifically through marijuana.   AXIS II:  Deferred.   AXIS III:  Degenerative disk disease.   AXIS IV:  Moderate.   AXIS V:  Approximately 50 at discharge.      MD/MEDQ  D:  02/08/2005  T:  02/09/2005  Job:  [id number]

## 2011-02-18 NOTE — H&P (Signed)
NAMEJAX, KENTNER NO.:  [id number]   MEDICAL RECORD NO.:  [id number]          PATIENT TYPE:  IPS   LOCATION:  0505                          FACILITY:  [facility]   PHYSICIAN:  Geoffery Lyons, M.D.      DATE OF BIRTH:  [birth year]   DATE OF ADMISSION:  12/16/2004  DATE OF DISCHARGE:                         PSYCHIATRIC ADMISSION ASSESSMENT   IDENTIFYING INFORMATION:  This is a 55 year old white male, who is married.  This is a voluntary admission.   HISTORY OF PRESENT ILLNESS:  This patient presented to the emergency  department complaining that his medications. . .   Already dictated.   Dictation ended at this point.      MAS/MEDQ  D:  12/16/2004  T:  12/16/2004  Job:  [id number]

## 2011-02-18 NOTE — H&P (Signed)
NAMEDERMOT, GREMILLION NO.:  [id number]   MEDICAL RECORD NO.:  [id number]          PATIENT TYPE:  IPS   LOCATION:  0506                          FACILITY:  [facility]   PHYSICIAN:  Geoffery Lyons, M.D.      DATE OF BIRTH:  Feb 17, 1956   DATE OF ADMISSION:  11/02/2005  DATE OF DISCHARGE:                         PSYCHIATRIC ADMISSION ASSESSMENT   IDENTIFYING INFORMATION:  This is a 55 year old white male who is separated.  This is an involuntary admission.   HISTORY OF PRESENT ILLNESS:  This 55 year old white male with a history of  schizophrenia, who ran out of benzodiazepines somewhere between 3 days ago  and a week ago, and seems unreliable in his description of taking his other  medications.  He says that he has been feeling agitated and had thoughts  yesterday that he wanted to kill himself, so his ex-wife, who he is  currently living with, called the police, who took him to the emergency  room.  The patient says that he ran out of his medicines earlier in the  week, having a lot of agitation, also a lot of chronic depressed mood over  the past 2 weeks to one month, being quite unhappy in his current living  situation.  He separated from his wife about 2 months ago and has gone back  to live with his first wife, but is quite unhappy there, difficulty getting  along with them.  Would like to reconcile with his second wife, but she  refuses to consider taking him back.  He endorses having auditory  hallucinations of voices saying die, die.  Has not been eating or drinking  regularly over the course of the past week, having sleep disruption and  getting quite anxious.   PAST PSYCHIATRIC HISTORY:  The patient is followed by Dr. Rudi Heap at  [facility].  He was last here at [facility] in April 2006.  He has a history of schizophrenia and cannabis  abuse.  When he becomes anxious, he increases his cannabis abuse, and this  exacerbates his schizophrenia symptoms.  He has a history of agitation with  prior suicidal ideations of shooting himself, and his primary psychiatrist,  Dr. Rudi Heap, had convinced him to sell his gun.  The patient denies any  recent attempts at suicide.   SOCIAL HISTORY:  The patient is separated from the wife and currently living  with his first wife, very unhappy and lonely right now.  One daughter is in  prison, with drug charges, and he feels quite stressed about that.  No  current legal problems.  He is currently awaiting resolution of his own  apartment.   FAMILY HISTORY:  Remarkable for a father with a history of alcoholism.   ALCOHOL AND DRUG HISTORY:  The patient does chronically use pain  medications, no evidence that he has used these inappropriately.  Also has  been on benzodiazepines for a long time, with no evidence of overt abuse.  Does have a history of cannabis abuse.   PAST MEDICAL HISTORY:  The patient is currently followed by  Dr. Olena Leatherwood in  [place], [place], [phone].  Medical problems:  Hypokalemia  diagnosed in the emergency room with an initial potassium of 2.7 and this  morning it is 3.3 after correction.  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   MEDICATIONS:  Ativan 1 mg in the morning and 2 mg at h.s., Seroquel 200 mg  p.o. q.h.s., Percocet for chronic pain 10/325 1 tab b.i.d..  Also takes  Flexeril from time to time, Ultram from time to time, and possibly some  Xanax 0.5 mg t.i.d.  We are taking medications from the patient's report,  and have been unable to confirm them after calling 2 different pharmacies.   DRUG ALLERGIES:  None.   POSITIVE PHYSICAL FINDINGS:  GENERAL:  The patient's full physical  examination was done in the emergency room.  Today, we find him to be a  medium-built, frail and disheveled-appearing white male who is edentulous.  He appears to have a little bit of  oral buccal movements which may be due to  missing dentures versus EPS.  VITAL SIGNS:  5 feet 11 inches tall, 178 pounds, temperature 97.8, pulse 95,  respirations 12, blood pressure 120/65.  HEENT:  Extraocular movements:  Tracking is poor.  He is anxious and  distracted.  No rhinorrhea.  Oral pharynx is not injected.  NECK:  Supple, no thyromegaly, no bruits heard.  CHEST:  Clear to auscultation.  BREAST EXAM:  Deferred.  CARDIOVASCULAR:  S1 and S2 heard.  No clicks, murmurs, gallops, or extra  sounds.  Apical pulse is synchronous with radial pulse.  ABDOMEN:  Flat, soft, nontender, nondistended.  Normal bowel sounds.  GENITOURINARY:  Deferred.  EXTREMITIES:  No edema.  Pink and warm.  Circulation is intact.  Sensory is  intact.  SKIN:  Ruddy in color, no clubbing, no cyanosis.  NEUROLOGIC:  Cranial nerves II-XII are intact, short gait, ambulating with a  cane.  Neuro is nonfocal.   DIAGNOSTIC STUDIES:  WBC 10.1, hemoglobin 12.8, hematocrit 36.9, platelets  253,000.  Sodium 134, potassium 3.3, chloride 99, CO2 28, BUN 13, creatinine  1.4, glucose 106.  SGOT 15, SGPT 17, alkaline phosphatase 55, total  bilirubin 1.7, acetaminophen and salicylate levels were negative.  Urine  drug screen positive for benzodiazepines and THC.  Alcohol was negative.  His TSH is currently pending.   MENTAL STATUS EXAM:  Fully alert male, anxious, tearful, jittery with some  scattered thinking.  Very preoccupied with his pain level, obviously quite  anxious.  Answers are short.  He offers very little, repeatedly asking for  pain medicine.  Some moaning.  Complaining about a lot of pain shooting up  his right hand, which he is unable to score.  Mood is agitated and anxious.  Thought process reveals a little bit of thought blocking, scattered  thinking, highly distractible, auditory hallucinations, suicidal ideation  but no specific plan and is able to contract for safety on the unit.  No homicidal  thoughts.  Cognitively intact to person and place, general time  frame and situation.   ADMISSION DIAGNOSIS:  AXIS I:  Schizophrenia not otherwise specified,  cannabis abuse.  AXIS II:  Deferred.  AXIS III:  Mild renal insufficiency, hypokalemia, lumbar disk disease,  chronic pain syndrome.  AXIS IV:  Severe, stress at home with grief over marital separation and lack  of adequate primary supports.  Housing situation is stressful.  AXIS V:  Current 25-30, past year 70.   INITIAL PLAN OF CARE:  Plan is to involuntarily admit the patient with q.15  minute checks in place, to stabilize his mood, decrease his auditory  hallucinations, and alleviate his suicidal ideation.  We are going to start  him back on Ativan 1 mg t.i.d. and h.s.  We are going to give him Geodon 10  mg IM now, emergently.  He has not responded to an initial dose of 50 mg of  Seroquel and is requesting an injection to get the voices in his head to  calm down.  We are going to do a baseline EKG on him, continue the K-Dur 40  mEq b.i.d. for a couple of days.  We will recheck his  metabolic panel, monitor his renal insufficiency and at this point we are  going to discontinue the Seroquel and go with Geodon 40 mg b.i.d.   ESTIMATED LENGTH OF STAY:  5 days.      Bruce Goodman, N.P.      Geoffery Lyons, M.D.  Electronically Signed    MAS/MEDQ  D:  11/02/2005  T:  11/02/2005  Job:  [id number]

## 2011-03-09 ENCOUNTER — Emergency Department (HOSPITAL_COMMUNITY)
Admission: EM | Admit: 2011-03-09 | Discharge: 2011-03-09 | Disposition: A | Discharge status: Home / Self Care | Payer: Medicare Other | Attending: Emergency Medicine | Admitting: Emergency Medicine

## 2011-03-09 DIAGNOSIS — G8929 Other chronic pain: Secondary | ICD-10-CM | POA: Insufficient documentation

## 2011-03-09 DIAGNOSIS — L089 Local infection of the skin and subcutaneous tissue, unspecified: Secondary | ICD-10-CM | POA: Insufficient documentation

## 2011-03-09 DIAGNOSIS — M25519 Pain in unspecified shoulder: Secondary | ICD-10-CM | POA: Insufficient documentation

## 2011-03-09 DIAGNOSIS — Z1389 Encounter for screening for other disorder: Secondary | ICD-10-CM | POA: Insufficient documentation

## 2011-03-09 DIAGNOSIS — Y998 Other external cause status: Secondary | ICD-10-CM | POA: Insufficient documentation

## 2011-03-09 DIAGNOSIS — F17200 Nicotine dependence, unspecified, uncomplicated: Secondary | ICD-10-CM | POA: Insufficient documentation

## 2011-03-09 DIAGNOSIS — M2550 Pain in unspecified joint: Secondary | ICD-10-CM | POA: Insufficient documentation

## 2011-03-09 DIAGNOSIS — M25539 Pain in unspecified wrist: Secondary | ICD-10-CM | POA: Insufficient documentation

## 2011-04-08 ENCOUNTER — Emergency Department (HOSPITAL_COMMUNITY)
Admission: EM | Admit: 2011-04-08 | Discharge: 2011-04-09 | Disposition: A | Discharge status: Home / Self Care | Payer: Medicare Other | Attending: Emergency Medicine | Admitting: Emergency Medicine

## 2011-04-08 DIAGNOSIS — F319 Bipolar disorder, unspecified: Secondary | ICD-10-CM | POA: Insufficient documentation

## 2011-04-08 DIAGNOSIS — R079 Chest pain, unspecified: Secondary | ICD-10-CM | POA: Insufficient documentation

## 2011-04-08 DIAGNOSIS — F17200 Nicotine dependence, unspecified, uncomplicated: Secondary | ICD-10-CM | POA: Insufficient documentation

## 2011-04-08 DIAGNOSIS — F209 Schizophrenia, unspecified: Secondary | ICD-10-CM | POA: Insufficient documentation

## 2011-04-08 HISTORY — DX: Schizophrenia, unspecified: F20.9

## 2011-04-08 HISTORY — DX: Major depressive disorder, single episode, unspecified: F32.9

## 2011-04-08 HISTORY — DX: Bipolar disorder, unspecified: F31.9

## 2011-04-08 HISTORY — DX: Depression, unspecified: F32.A

## 2011-04-08 LAB — CBC
HCT: 41.5 % (ref 39.0–52.0)
Hemoglobin: 14.2 g/dL (ref 13.0–17.0)
MCH: 32.3 pg (ref 26.0–34.0)
MCHC: 34.2 g/dL (ref 30.0–36.0)
MCV: 94.3 fL (ref 78.0–100.0)
Platelets: 237 10*3/uL (ref 150–400)
RBC: 4.4 MIL/uL (ref 4.22–5.81)
RDW: 14 % (ref 11.5–15.5)
WBC: 6 10*3/uL (ref 4.0–10.5)

## 2011-04-08 LAB — DIFFERENTIAL
Basophils Absolute: 0 10*3/uL (ref 0.0–0.1)
Basophils Relative: 1 % (ref 0–1)
Eosinophils Absolute: 0.3 10*3/uL (ref 0.0–0.7)
Eosinophils Relative: 5 % (ref 0–5)
Lymphocytes Relative: 45 % (ref 12–46)
Lymphs Abs: 2.7 10*3/uL (ref 0.7–4.0)
Monocytes Absolute: 0.5 10*3/uL (ref 0.1–1.0)
Monocytes Relative: 8 % (ref 3–12)
Neutro Abs: 2.5 10*3/uL (ref 1.7–7.7)
Neutrophils Relative %: 41 % — ABNORMAL LOW (ref 43–77)

## 2011-04-09 ENCOUNTER — Emergency Department (HOSPITAL_COMMUNITY): Payer: Medicare Other

## 2011-04-09 LAB — URINALYSIS, ROUTINE W REFLEX MICROSCOPIC
Glucose, UA: NEGATIVE mg/dL
Hgb urine dipstick: NEGATIVE
Leukocytes, UA: NEGATIVE
Nitrite: NEGATIVE
Protein, ur: NEGATIVE mg/dL
Specific Gravity, Urine: >1.03 — ABNORMAL HIGH (ref 1.005–1.030)
Urobilinogen, UA: 0.2 mg/dL (ref 0.0–1.0)
pH: 5 (ref 5.0–8.0)

## 2011-04-09 LAB — COMPREHENSIVE METABOLIC PANEL
ALT: 9 U/L (ref 0–53)
AST: 13 U/L (ref 0–37)
Albumin: 4 g/dL (ref 3.5–5.2)
Alkaline Phosphatase: 67 U/L (ref 39–117)
BUN: 14 mg/dL (ref 6–23)
CO2: 32 mEq/L (ref 19–32)
Calcium: 9.3 mg/dL (ref 8.4–10.5)
Chloride: 101 mEq/L (ref 96–112)
Creatinine, Ser: 0.91 mg/dL (ref 0.50–1.35)
GFR calc Af Amer: >60 mL/min (ref >60)
GFR calc non Af Amer: >60 mL/min (ref >60)
Glucose, Bld: 85 mg/dL (ref 70–99)
Potassium: 3.5 mEq/L (ref 3.5–5.1)
Sodium: 141 mEq/L (ref 135–145)
Total Bilirubin: 0.9 mg/dL (ref 0.3–1.2)
Total Protein: 7.7 g/dL (ref 6.0–8.3)

## 2011-04-09 LAB — RAPID URINE DRUG SCREEN, HOSP PERFORMED
Amphetamines: NOT DETECTED
Barbiturates: NOT DETECTED
Benzodiazepines: POSITIVE — AB
Cocaine: NOT DETECTED
Opiates: POSITIVE — AB
Tetrahydrocannabinol: POSITIVE — AB

## 2011-04-09 LAB — ETHANOL: Alcohol, Ethyl (B): <11 mg/dL (ref 0–11)

## 2011-04-09 MED ORDER — BACITRACIN ZINC 500 UNIT/GM EX OINT
TOPICAL_OINTMENT | CUTANEOUS | Status: AC
  Administered 2011-04-09: 1 via TOPICAL
  Filled 2011-04-09: qty 2

## 2011-04-09 MED ORDER — HYDROCODONE-ACETAMINOPHEN 5-500 MG PO TABS: 1.0000 | ORAL_TABLET | Freq: Four times a day (QID) | ORAL | Status: AC | PRN

## 2011-04-09 MED ORDER — KETOROLAC TROMETHAMINE 60 MG/2ML IM SOLN
INTRAMUSCULAR | Status: AC
  Administered 2011-04-09: 60 mg via INTRAMUSCULAR
  Filled 2011-04-09: qty 2

## 2011-04-09 MED ORDER — OXYCODONE-ACETAMINOPHEN 5-325 MG PO TABS
ORAL_TABLET | ORAL | Status: AC
  Administered 2011-04-09: 1 via OROMUCOSAL
  Filled 2011-04-09: qty 1

## 2011-04-09 NOTE — ED Notes (Signed)
Pt resting. Waiting for breakfast. Sitter at bedside

## 2011-04-09 NOTE — ED Notes (Signed)
Right Finger dressed with bacitracian, telfa, kerlix.

## 2011-04-09 NOTE — ED Notes (Signed)
Pt lying in bed; pt asking if I could wrap his fingers due to cut; I informed him that I would be back to rewrap his fingers

## 2011-04-09 NOTE — ED Provider Notes (Addendum)
History     Chief Complaint  Patient presents with  . Medical Clearance   Patient is a 55 y.o. male presenting with chest pain.  Chest Pain Episode onset: 11 months ago. Chest pain occurs constantly. The chest pain is unchanged. The severity of the pain is mild. The quality of the pain is described as aching. The pain does not radiate. Chest pain is worsened by certain positions. Pertinent negatives for primary symptoms include no fever, no syncope, no shortness of breath, no cough, no wheezing, no palpitations, no abdominal pain, no nausea, no vomiting, no dizziness and no altered mental status.  Pertinent negatives for associated symptoms include no diaphoresis, no orthopnea and no paroxysmal nocturnal dyspnea. He tried nothing for the symptoms.     Past Medical History  Diagnosis Date  . Bipolar 1 disorder   . Depression   . Schizophrenia     History reviewed. No pertinent past surgical history.  No family history on file.  History  Substance Use Topics  . Smoking status: Current Everyday Smoker  . Smokeless tobacco: Not on file  . Alcohol Use: No      Review of Systems  Constitutional: Negative for fever and diaphoresis.  Respiratory: Negative for cough, shortness of breath and wheezing.   Cardiovascular: Positive for chest pain. Negative for palpitations, orthopnea and syncope.  Gastrointestinal: Negative for nausea, vomiting and abdominal pain.  Neurological: Negative for dizziness.  Psychiatric/Behavioral: Negative for altered mental status.  All other systems reviewed and are negative.    Physical Exam  BP 145/77  Pulse 113  Temp(Src) 98 F (36.7 C) (Oral)  Resp 20  Ht 5\' 11"  (1.803 m)  Wt 144 lb (65.318 kg)  BMI 20.08 kg/m2  SpO2 97%  Physical Exam  Constitutional: He is oriented to person, place, and time. He appears well-developed and well-nourished.  HENT:  Head: Normocephalic.  Eyes: Pupils are equal, round, and reactive to light.  Neck:  Normal range of motion.  Cardiovascular: Normal rate, regular rhythm, normal heart sounds and intact distal pulses.   Pulmonary/Chest: He has no rales. He exhibits tenderness.       Left sided chest tenderness without rash   Abdominal: Soft. Bowel sounds are normal. He exhibits no mass. There is no tenderness.  Musculoskeletal: Normal range of motion.  Neurological: He is alert and oriented to person, place, and time.    ED Course  Procedures  MDM Constant left sided CP. Pain treated. Will obtain cxr. Pt reports mental health "issues". However, no HI or SI. No indication for mental health evaluation tonight. No indication for IVC. i don't believe the pt to be a threat to himself or others      Lyanne Co, MD 04/09/11 2703  Lyanne Co, MD 04/09/11 [phone]

## 2011-04-09 NOTE — ED Notes (Signed)
Patient up to bathroom, escorted back to room. Awaiting to be taken to radiology. Will continue to monitor patient.

## 2011-04-11 ENCOUNTER — Encounter (HOSPITAL_COMMUNITY): Payer: Self-pay

## 2011-04-11 ENCOUNTER — Emergency Department (HOSPITAL_COMMUNITY)
Admission: EM | Admit: 2011-04-11 | Discharge: 2011-04-12 | Disposition: A | Discharge status: Home / Self Care | Payer: Medicare Other | Attending: Emergency Medicine | Admitting: Emergency Medicine

## 2011-04-11 DIAGNOSIS — F17200 Nicotine dependence, unspecified, uncomplicated: Secondary | ICD-10-CM | POA: Insufficient documentation

## 2011-04-11 DIAGNOSIS — M25519 Pain in unspecified shoulder: Secondary | ICD-10-CM | POA: Insufficient documentation

## 2011-04-11 DIAGNOSIS — M79609 Pain in unspecified limb: Secondary | ICD-10-CM | POA: Insufficient documentation

## 2011-04-11 DIAGNOSIS — G8929 Other chronic pain: Secondary | ICD-10-CM | POA: Insufficient documentation

## 2011-04-11 LAB — RAPID URINE DRUG SCREEN, HOSP PERFORMED
Amphetamines: NOT DETECTED
Barbiturates: NOT DETECTED
Benzodiazepines: NOT DETECTED
Cocaine: NOT DETECTED
Opiates: NOT DETECTED
Tetrahydrocannabinol: NOT DETECTED

## 2011-04-11 LAB — URINALYSIS, ROUTINE W REFLEX MICROSCOPIC
Bilirubin Urine: NEGATIVE
Glucose, UA: NEGATIVE mg/dL
Hgb urine dipstick: NEGATIVE
Ketones, ur: NEGATIVE mg/dL
Leukocytes, UA: NEGATIVE
Nitrite: NEGATIVE
Protein, ur: NEGATIVE mg/dL
Specific Gravity, Urine: 1.01 (ref 1.005–1.030)
Urobilinogen, UA: 0.2 mg/dL (ref 0.0–1.0)
pH: 7 (ref 5.0–8.0)

## 2011-04-11 LAB — CBC
HCT: 41.3 % (ref 39.0–52.0)
Hemoglobin: 13.8 g/dL (ref 13.0–17.0)
MCH: 32.2 pg (ref 26.0–34.0)
MCHC: 33.4 g/dL (ref 30.0–36.0)
MCV: 96.3 fL (ref 78.0–100.0)
Platelets: 242 10*3/uL (ref 150–400)
RBC: 4.29 MIL/uL (ref 4.22–5.81)
RDW: 14.2 % (ref 11.5–15.5)
WBC: 7.3 10*3/uL (ref 4.0–10.5)

## 2011-04-11 LAB — BASIC METABOLIC PANEL
BUN: 9 mg/dL (ref 6–23)
CO2: 32 mEq/L (ref 19–32)
Calcium: 9.4 mg/dL (ref 8.4–10.5)
Chloride: 102 mEq/L (ref 96–112)
Creatinine, Ser: 0.85 mg/dL (ref 0.50–1.35)
GFR calc Af Amer: >60 mL/min (ref >60)
GFR calc non Af Amer: >60 mL/min (ref >60)
Glucose, Bld: 98 mg/dL (ref 70–99)
Potassium: 4 mEq/L (ref 3.5–5.1)
Sodium: 139 mEq/L (ref 135–145)

## 2011-04-11 LAB — ETHANOL: Alcohol, Ethyl (B): <11 mg/dL (ref 0–11)

## 2011-04-11 MED ORDER — IBUPROFEN 600 MG PO TABS: 600.0000 mg | ORAL_TABLET | Freq: Four times a day (QID) | ORAL | Status: AC | PRN

## 2011-04-11 MED ORDER — ACETAMINOPHEN 325 MG PO TABS
650.0000 mg | ORAL_TABLET | Freq: Once | ORAL | Status: AC
  Administered 2011-04-11: 650 mg via ORAL
  Filled 2011-04-11: qty 2
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
## 2011-04-11 NOTE — ED Notes (Signed)
Patient states that he does not want to kill himself but that he wish God would take him away so that he would not be hurting because he is tired of being in pain.

## 2011-04-12 NOTE — ED Notes (Signed)
Calm at this time, to be seen by ACT Team. Pt denies  SI/HI

## 2011-10-01 DIAGNOSIS — D62 Acute posthemorrhagic anemia: Secondary | ICD-10-CM | POA: Diagnosis not present

## 2011-10-01 DIAGNOSIS — R079 Chest pain, unspecified: Secondary | ICD-10-CM | POA: Diagnosis not present

## 2011-10-01 DIAGNOSIS — M25519 Pain in unspecified shoulder: Secondary | ICD-10-CM | POA: Diagnosis not present

## 2011-10-01 DIAGNOSIS — M19019 Primary osteoarthritis, unspecified shoulder: Secondary | ICD-10-CM | POA: Diagnosis not present

## 2011-10-01 DIAGNOSIS — E869 Volume depletion, unspecified: Secondary | ICD-10-CM | POA: Diagnosis present

## 2011-10-01 DIAGNOSIS — I498 Other specified cardiac arrhythmias: Secondary | ICD-10-CM | POA: Diagnosis present

## 2011-10-01 DIAGNOSIS — R0789 Other chest pain: Secondary | ICD-10-CM | POA: Diagnosis present

## 2011-10-01 DIAGNOSIS — F209 Schizophrenia, unspecified: Secondary | ICD-10-CM | POA: Diagnosis present

## 2011-10-01 DIAGNOSIS — D126 Benign neoplasm of colon, unspecified: Secondary | ICD-10-CM | POA: Diagnosis present

## 2011-10-01 DIAGNOSIS — K922 Gastrointestinal hemorrhage, unspecified: Secondary | ICD-10-CM | POA: Diagnosis not present

## 2011-10-01 DIAGNOSIS — K319 Disease of stomach and duodenum, unspecified: Secondary | ICD-10-CM | POA: Diagnosis present

## 2011-10-01 DIAGNOSIS — I9589 Other hypotension: Secondary | ICD-10-CM | POA: Diagnosis present

## 2011-10-01 DIAGNOSIS — R21 Rash and other nonspecific skin eruption: Secondary | ICD-10-CM | POA: Diagnosis not present

## 2011-10-01 DIAGNOSIS — M67919 Unspecified disorder of synovium and tendon, unspecified shoulder: Secondary | ICD-10-CM | POA: Diagnosis not present

## 2011-10-01 DIAGNOSIS — B353 Tinea pedis: Secondary | ICD-10-CM | POA: Diagnosis not present

## 2011-10-01 DIAGNOSIS — K5730 Diverticulosis of large intestine without perforation or abscess without bleeding: Secondary | ICD-10-CM | POA: Diagnosis present

## 2011-10-01 DIAGNOSIS — F329 Major depressive disorder, single episode, unspecified: Secondary | ICD-10-CM | POA: Diagnosis present

## 2011-10-01 DIAGNOSIS — Z888 Allergy status to other drugs, medicaments and biological substances status: Secondary | ICD-10-CM | POA: Diagnosis not present

## 2011-10-19 DIAGNOSIS — E876 Hypokalemia: Secondary | ICD-10-CM | POA: Diagnosis not present

## 2011-10-19 DIAGNOSIS — R1012 Left upper quadrant pain: Secondary | ICD-10-CM | POA: Diagnosis not present

## 2011-10-19 DIAGNOSIS — G8929 Other chronic pain: Secondary | ICD-10-CM | POA: Diagnosis not present

## 2011-10-19 DIAGNOSIS — F329 Major depressive disorder, single episode, unspecified: Secondary | ICD-10-CM | POA: Diagnosis not present

## 2011-10-19 DIAGNOSIS — F17200 Nicotine dependence, unspecified, uncomplicated: Secondary | ICD-10-CM | POA: Diagnosis not present

## 2011-10-19 DIAGNOSIS — R079 Chest pain, unspecified: Secondary | ICD-10-CM | POA: Diagnosis not present

## 2011-10-19 DIAGNOSIS — Z008 Encounter for other general examination: Secondary | ICD-10-CM | POA: Diagnosis not present

## 2011-10-19 DIAGNOSIS — Z79899 Other long term (current) drug therapy: Secondary | ICD-10-CM | POA: Diagnosis not present

## 2011-12-30 DIAGNOSIS — R109 Unspecified abdominal pain: Secondary | ICD-10-CM | POA: Diagnosis not present

## 2011-12-30 DIAGNOSIS — R52 Pain, unspecified: Secondary | ICD-10-CM | POA: Diagnosis not present

## 2011-12-30 DIAGNOSIS — R079 Chest pain, unspecified: Secondary | ICD-10-CM | POA: Diagnosis not present

## 2011-12-30 DIAGNOSIS — R0789 Other chest pain: Secondary | ICD-10-CM | POA: Diagnosis not present

## 2011-12-30 DIAGNOSIS — F17200 Nicotine dependence, unspecified, uncomplicated: Secondary | ICD-10-CM | POA: Diagnosis not present

## 2011-12-30 DIAGNOSIS — R918 Other nonspecific abnormal finding of lung field: Secondary | ICD-10-CM | POA: Diagnosis not present

## 2011-12-30 DIAGNOSIS — R1032 Left lower quadrant pain: Secondary | ICD-10-CM | POA: Diagnosis not present

## 2012-01-18 ENCOUNTER — Encounter (HOSPITAL_COMMUNITY): Payer: Self-pay | Admitting: *Deleted

## 2012-01-18 ENCOUNTER — Emergency Department (HOSPITAL_COMMUNITY)
Admission: EM | Admit: 2012-01-18 | Discharge: 2012-01-19 | Disposition: A | Discharge status: Other Acute Inpatient Hospital | Payer: Medicare Other | Source: Home / Self Care | Attending: Emergency Medicine | Admitting: Emergency Medicine

## 2012-01-18 DIAGNOSIS — F17200 Nicotine dependence, unspecified, uncomplicated: Secondary | ICD-10-CM | POA: Insufficient documentation

## 2012-01-18 DIAGNOSIS — F1210 Cannabis abuse, uncomplicated: Secondary | ICD-10-CM | POA: Insufficient documentation

## 2012-01-18 DIAGNOSIS — F209 Schizophrenia, unspecified: Secondary | ICD-10-CM

## 2012-01-18 DIAGNOSIS — F319 Bipolar disorder, unspecified: Secondary | ICD-10-CM | POA: Insufficient documentation

## 2012-01-18 LAB — BASIC METABOLIC PANEL
BUN: 9 mg/dL (ref 6–23)
CO2: 31 mEq/L (ref 19–32)
Calcium: 9.2 mg/dL (ref 8.4–10.5)
Chloride: 101 mEq/L (ref 96–112)
Creatinine, Ser: 0.83 mg/dL (ref 0.50–1.35)
GFR calc Af Amer: >90 mL/min (ref >90)
GFR calc non Af Amer: >90 mL/min (ref >90)
Glucose, Bld: 80 mg/dL (ref 70–99)
Potassium: 3.1 mEq/L — ABNORMAL LOW (ref 3.5–5.1)
Sodium: 139 mEq/L (ref 135–145)

## 2012-01-18 LAB — DIFFERENTIAL
Basophils Absolute: 0 10*3/uL (ref 0.0–0.1)
Basophils Relative: 1 % (ref 0–1)
Eosinophils Absolute: 0.2 10*3/uL (ref 0.0–0.7)
Eosinophils Relative: 4 % (ref 0–5)
Lymphocytes Relative: 38 % (ref 12–46)
Lymphs Abs: 2.4 10*3/uL (ref 0.7–4.0)
Monocytes Absolute: 0.6 10*3/uL (ref 0.1–1.0)
Monocytes Relative: 9 % (ref 3–12)
Neutro Abs: 3.1 10*3/uL (ref 1.7–7.7)
Neutrophils Relative %: 49 % (ref 43–77)

## 2012-01-18 LAB — CBC
HCT: 40.1 % (ref 39.0–52.0)
Hemoglobin: 12.7 g/dL — ABNORMAL LOW (ref 13.0–17.0)
MCH: 27.5 pg (ref 26.0–34.0)
MCHC: 31.7 g/dL (ref 30.0–36.0)
MCV: 87 fL (ref 78.0–100.0)
Platelets: 211 10*3/uL (ref 150–400)
RBC: 4.61 MIL/uL (ref 4.22–5.81)
RDW: 16.2 % — ABNORMAL HIGH (ref 11.5–15.5)
WBC: 6.2 10*3/uL (ref 4.0–10.5)

## 2012-01-18 LAB — ETHANOL: Alcohol, Ethyl (B): <11 mg/dL (ref 0–11)

## 2012-01-18 LAB — RAPID URINE DRUG SCREEN, HOSP PERFORMED
Amphetamines: NOT DETECTED
Barbiturates: NOT DETECTED
Benzodiazepines: NOT DETECTED
Cocaine: NOT DETECTED
Opiates: NOT DETECTED
Tetrahydrocannabinol: POSITIVE — AB

## 2012-01-18 NOTE — BH Assessment (Signed)
Assessment Note   Bruce Goodman is an 56 y.o. male. PT PRESENTS WITH INCREASE DEPRESSION & SUICIDAL THOUGHTS WITH A  PLAN TO WALK IN FRONT OF TRAFFIC. PT EXPRESSED THAT HE HAS NOTHINGS TO LIVE FOR & HAD NO SUPPORT FROM RELATIVES. PT IS EMOTIONAL & TEARFUL; STATES HE IS HOPELESS & HELPLESS. PT ADMITS TO RACING THOUGHTS & TENDS TO USE THC TO HELP HIM CALM DOWN. PT DENIES HI OR AV. PT EXPRESSED THE HE NEEDED HELP GETTING ON THE RIGHT MEDS  & SET UP WITH THE RIGHT PROVIDER. PT IS NOT ABLE TO CONTRACT FOR SAFETY. PT HAS BEEN REFERRED TO [facility] FOR ADMISSION & IS PENDING DISPOSITION.  Axis I: Major Depression, Recurrent severe, Substance Induced Mood Disorder and CANNIBUS ABUSE Axis II: Deferred Axis III:  Past Medical History  Diagnosis Date  . Bipolar 1 disorder   . Depression   . Schizophrenia    Axis IV: economic problems, housing problems, other psychosocial or environmental problems, problems related to social environment and problems with primary support group Axis V: 11-20 some danger of hurting self or others possible OR occasionally fails to maintain minimal personal hygiene OR gross impairment in communication  Past Medical History:  Past Medical History  Diagnosis Date  . Bipolar 1 disorder   . Depression   . Schizophrenia     Past Surgical History  Procedure Date  . Wrist surgery     right    Family History: No family history on file.  Social History:  reports that he has been smoking.  He does not have any smokeless tobacco history on file. He reports that he uses illicit drugs (Marijuana). He reports that he does not drink alcohol.  Additional Social History:    Allergies:  Allergies  Allergen Reactions  . Haldol (Haloperidol Decanoate)     Alters mental status  . Seroquel (Quetiapine Fumerate)     UNKNOWN REACTION    Home Medications:  No current facility-administered medications on file as of 01/18/2012.   No current outpatient  prescriptions on file as of 01/18/2012.    OB/GYN Status:  No LMP for male patient.  General Assessment Data Location of Assessment: [facility] ACT Assessment: Yes Living Arrangements: Alone Can pt return to current living arrangement?: Yes Admission Status: Voluntary Is patient capable of signing voluntary admission?: Yes Transfer from: Acute Hospital Referral Source: Self/Family/Friend     Risk to self Suicidal Ideation: Yes-Currently Present Suicidal Intent: Yes-Currently Present Is patient at risk for suicide?: Yes Suicidal Plan?: Yes-Currently Present Specify Current Suicidal Plan: JUMP INFRONT OF A CAR Access to Means: Yes Specify Access to Suicidal Means: CAR What has been your use of drugs/alcohol within the last 12 months?: PT ADMITS TO THC USE ON A REGULAR BASES Previous Attempts/Gestures: Yes How many times?: 1  Other Self Harm Risks: NA Triggers for Past Attempts: Unpredictable;Other personal contacts Intentional Self Injurious Behavior: None Family Suicide History: No Recent stressful life event(s): Financial Problems;Turmoil (Comment) Persecutory voices/beliefs?: No Depression: Yes Depression Symptoms: Loss of interest in usual pleasures;Fatigue;Isolating Substance abuse history and/or treatment for substance abuse?: Yes Suicide prevention information given to non-admitted patients: Not applicable  Risk to Others Homicidal Ideation: No Thoughts of Harm to Others: No Current Homicidal Intent: No Current Homicidal Plan: No Access to Homicidal Means: No Identified Victim: NA History of harm to others?: No Assessment of Violence: None Noted Violent Behavior Description: CALM, COOPERATIVE, DEPRESSED Does patient have access to weapons?: No Criminal Charges  Pending?: No Does patient have a court date: No  Psychosis Hallucinations: None noted Delusions: None noted  Mental Status Report Appear/Hygiene: Body odor;Disheveled;Poor hygiene Eye Contact:  Good Motor Activity: Freedom of movement Speech: Logical/coherent Level of Consciousness: Alert Mood: Depressed;Anhedonia;Despair;Sad Affect: Depressed;Appropriate to circumstance;Irritable Anxiety Level: Minimal Thought Processes: Coherent;Relevant Judgement: Impaired Orientation: Person;Place;Time;Situation Obsessive Compulsive Thoughts/Behaviors: None  Cognitive Functioning Concentration: Decreased Memory: Recent Intact;Remote Intact IQ: Average Insight: Poor Impulse Control: Poor Appetite: Poor Weight Loss: 0  Weight Gain: 0  Sleep: Decreased Total Hours of Sleep: 2  Vegetative Symptoms: None  Prior Inpatient Therapy Prior Inpatient Therapy: Yes Prior Therapy Dates: UNK Prior Therapy Facilty/Provider(s): [facility] BUT CANT REMEMBER Reason for Treatment: STABILIZATION  Prior Outpatient Therapy Prior Outpatient Therapy: No Prior Therapy Dates: NA Prior Therapy Facilty/Provider(s): NA Reason for Treatment: NA            Values / Beliefs Cultural Requests During Hospitalization: None Spiritual Requests During Hospitalization: None        Additional Information 1:1 In Past 12 Months?: No CIRT Risk: No Elopement Risk: No Does patient have medical clearance?: Yes     Disposition:  Disposition Disposition of Patient: Inpatient treatment program;Referred to ([facility] PENDING DISPOSITION) Type of inpatient treatment program: Adult  On Site Evaluation by:   Reviewed with Physician:     Waldron Session 01/18/2012 10:47 PM

## 2012-01-18 NOTE — ED Notes (Signed)
Pt states he has a lot of things going on in his life for a long time. Pt requesting something to drink.

## 2012-01-18 NOTE — ED Notes (Signed)
Pt states that he has had a lot going on in his life and needs help. States that he doesn't want to go into detail but that he has had thoughts of killing himself. When asked about a plan he states "it will be quick".

## 2012-01-18 NOTE — ED Notes (Signed)
Pt states " I am mentally and emotionally and physically exhausted and I need help."  Admits to having SI but denies plan.  States, "If I did have a plan, it would be a quick way."  Pt reports not sleeping at night.  Pt is voluntary.  Pt cooperative in triage.

## 2012-01-18 NOTE — ED Provider Notes (Signed)
History   This chart was scribed for Donnetta Hutching, MD by Sofie Rower. The patient was seen in room [facility] and the patient's care was started at 5:47 PM     CSN: [id number]  Arrival date & time 01/18/12  1709   First MD Initiated Contact with Patient 01/18/12 1729      Chief Complaint  Patient presents with  . V70.1    (Consider location/radiation/quality/duration/timing/severity/associated sxs/prior treatment) HPI  Bruce Goodman is a 56 y.o. male who presents to the Emergency Department complaining of severe, episodic schizophrenia onset today with associated symptoms of myalgias, anxiety, depression, loss of sleep, loss of weight. The pt states "I am very anxious and worn out." Pt informs the EDP "I don't want to live, if I did myself in, it would be really quick." Pt states "I've had it, I just need some help." Pt has a hx of of bipolar disorder, depression, schizophrenia. Pt has a hx of visiting Dr. Candis Shine in [place].  Pt denies taking any medication at present, MI, high blood pressure.  Pt does not visit a PCP.   Past Medical History  Diagnosis Date  . Bipolar 1 disorder   . Depression   . Schizophrenia     Past Surgical History  Procedure Date  . Wrist surgery     right      History  Substance Use Topics  . Smoking status: Current Everyday Smoker  . Smokeless tobacco: Not on file  . Alcohol Use: No      Review of Systems  All other systems reviewed and are negative.    10 Systems reviewed and all are negative for acute change except as noted in the HPI.    Allergies  Haldol and Seroquel  Home Medications  No current outpatient prescriptions on file.  BP 138/87  Pulse 93  Temp(Src) 99.2 F (37.3 C) (Oral)  Resp 20  Ht 5\' 11"  (1.803 m)  Wt 161 lb 14.4 oz (73.437 kg)  BMI 22.58 kg/m2  SpO2 100%  Physical Exam  Nursing note and vitals reviewed. Constitutional: He appears well-developed and well-nourished.  HENT:  Head: Atraumatic.    Right Ear: External ear normal.  Left Ear: External ear normal.  Nose: Nose normal.  Eyes: EOM are normal. Pupils are equal, round, and reactive to light.  Neck: Normal range of motion. Neck supple.  Cardiovascular: Normal rate.   Pulmonary/Chest: Effort normal.  Musculoskeletal: Normal range of motion.  Neurological: He is alert. Coordination normal.  Skin: Skin is warm and dry.  Psychiatric:       Flight of ideas. Pt is agitated.     ED Course  Procedures (including critical care time)  DIAGNOSTIC STUDIES: Oxygen Saturation is 100% on room air, normal by my interpretation.    COORDINATION OF CARE:    Results for orders placed during the hospital encounter of 01/18/12  CBC      Component Value Range   WBC 6.2  4.0 - 10.5 (K/uL)   RBC 4.61  4.22 - 5.81 (MIL/uL)   Hemoglobin 12.7 (*) 13.0 - 17.0 (g/dL)   HCT 11.9  14.7 - 82.9 (%)   MCV 87.0  78.0 - 100.0 (fL)   MCH 27.5  26.0 - 34.0 (pg)   MCHC 31.7  30.0 - 36.0 (g/dL)   RDW 56.2 (*) 13.0 - 15.5 (%)   Platelets 211  150 - 400 (K/uL)  DIFFERENTIAL      Component Value Range   Neutrophils Relative  49  43 - 77 (%)   Neutro Abs 3.1  1.7 - 7.7 (K/uL)   Lymphocytes Relative 38  12 - 46 (%)   Lymphs Abs 2.4  0.7 - 4.0 (K/uL)   Monocytes Relative 9  3 - 12 (%)   Monocytes Absolute 0.6  0.1 - 1.0 (K/uL)   Eosinophils Relative 4  0 - 5 (%)   Eosinophils Absolute 0.2  0.0 - 0.7 (K/uL)   Basophils Relative 1  0 - 1 (%)   Basophils Absolute 0.0  0.0 - 0.1 (K/uL)  BASIC METABOLIC PANEL      Component Value Range   Sodium 139  135 - 145 (mEq/L)   Potassium 3.1 (*) 3.5 - 5.1 (mEq/L)   Chloride 101  96 - 112 (mEq/L)   CO2 31  19 - 32 (mEq/L)   Glucose, Bld 80  70 - 99 (mg/dL)   BUN 9  6 - 23 (mg/dL)   Creatinine, Ser 1.61  0.50 - 1.35 (mg/dL)   Calcium 9.2  8.4 - 09.6 (mg/dL)   GFR calc non Af Amer >90  >90 (mL/min)   GFR calc Af Amer >90  >90 (mL/min)  URINE RAPID DRUG SCREEN (HOSP PERFORMED)      Component Value Range    Opiates NONE DETECTED  NONE DETECTED    Cocaine NONE DETECTED  NONE DETECTED    Benzodiazepines NONE DETECTED  NONE DETECTED    Amphetamines NONE DETECTED  NONE DETECTED    Tetrahydrocannabinol POSITIVE (*) NONE DETECTED    Barbiturates NONE DETECTED  NONE DETECTED   ETHANOL      Component Value Range   Alcohol, Ethyl (B) <11  0 - 11 (mg/dL)     No results found.   No diagnosis found.   5:52PM- EDP at bedside discusses treatment plan concerning counselor.   MDM  Patient has schizophrenia and bipolar disorder. Has not been taking his medication. He has flight of ideas with tangential thinking.behaviorall health consult. Probable admission.      I personally performed the services described in this documentation, which was scribed in my presence. The recorded information has been reviewed and considered.    Donnetta Hutching, MD 01/19/12 [phone]

## 2012-01-18 NOTE — ED Notes (Signed)
Pt reports is homeless and has no where to go.

## 2012-01-19 ENCOUNTER — Inpatient Hospital Stay (HOSPITAL_COMMUNITY)
Admission: EM | Admit: 2012-01-19 | Discharge: 2012-01-23 | DRG: 885 | Disposition: A | Discharge status: Home / Self Care | Payer: Medicare Other | Source: Ambulatory Visit | Attending: Psychiatry | Admitting: Psychiatry

## 2012-01-19 ENCOUNTER — Encounter (HOSPITAL_COMMUNITY): Payer: Self-pay

## 2012-01-19 DIAGNOSIS — Z5900 Homelessness unspecified: Secondary | ICD-10-CM

## 2012-01-19 DIAGNOSIS — F39 Unspecified mood [affective] disorder: Principal | ICD-10-CM | POA: Diagnosis present

## 2012-01-19 DIAGNOSIS — F99 Mental disorder, not otherwise specified: Secondary | ICD-10-CM

## 2012-01-19 DIAGNOSIS — F1121 Opioid dependence, in remission: Secondary | ICD-10-CM | POA: Diagnosis present

## 2012-01-19 DIAGNOSIS — F489 Nonpsychotic mental disorder, unspecified: Secondary | ICD-10-CM | POA: Diagnosis not present

## 2012-01-19 DIAGNOSIS — F1210 Cannabis abuse, uncomplicated: Secondary | ICD-10-CM | POA: Diagnosis present

## 2012-01-19 MED ORDER — MIRTAZAPINE 15 MG PO TABS
15.0000 mg | ORAL_TABLET | Freq: Every day | ORAL | Status: DC
  Administered 2012-01-19 – 2012-01-22 (×4): 15 mg via ORAL
  Filled 2012-01-19 (×6): qty 1

## 2012-01-19 MED ORDER — ALUM & MAG HYDROXIDE-SIMETH 200-200-20 MG/5ML PO SUSP
30.0000 mL | ORAL | Status: DC | PRN
  Administered 2012-01-19 – 2012-01-21 (×4): 30 mL via ORAL

## 2012-01-19 MED ORDER — ZOLPIDEM TARTRATE 5 MG PO TABS
10.0000 mg | ORAL_TABLET | Freq: Once | ORAL | Status: AC
  Administered 2012-01-19: 10 mg via ORAL
  Filled 2012-01-19 (×2): qty 2

## 2012-01-19 MED ORDER — MAGNESIUM HYDROXIDE 400 MG/5ML PO SUSP: 30.0000 mL | Freq: Every day | ORAL | Status: DC | PRN

## 2012-01-19 MED ORDER — ACETAMINOPHEN 325 MG PO TABS: 650.0000 mg | ORAL_TABLET | Freq: Four times a day (QID) | ORAL | Status: DC | PRN

## 2012-01-19 MED ORDER — GI COCKTAIL ~~LOC~~
30.0000 mL | Freq: Once | ORAL | Status: AC
  Administered 2012-01-19: 30 mL via ORAL
  Filled 2012-01-19: qty 30

## 2012-01-19 MED ORDER — ACETAMINOPHEN 325 MG PO TABS
650.0000 mg | ORAL_TABLET | Freq: Four times a day (QID) | ORAL | Status: DC | PRN
  Administered 2012-01-19 – 2012-01-23 (×9): 650 mg via ORAL

## 2012-01-19 MED ORDER — OLANZAPINE 5 MG PO TBDP
5.0000 mg | ORAL_TABLET | Freq: Every day | ORAL | Status: DC
  Administered 2012-01-19 – 2012-01-22 (×4): 5 mg via ORAL
  Filled 2012-01-19 (×6): qty 1

## 2012-01-19 MED ORDER — ALUM & MAG HYDROXIDE-SIMETH 200-200-20 MG/5ML PO SUSP
30.0000 mL | ORAL | Status: DC | PRN
  Administered 2012-01-19: 30 mL via ORAL

## 2012-01-19 NOTE — Discharge Planning (Signed)
Checked in with new patient mid-morning.  States he is here due to depression secondary to being homeless and living in the woods.  "I did that last May to September, and I can't do that again."  Lived in a house over the winter, but could not afford to pay the heating and electricity bill."  I offered him a list of affordable housing in [place].  He rejected the offer.  Anything cheap in [place] will be in a bad neighborhood with crack heads.  Requested I find him housing in [place].  Told him I knew of none.  Agreed to get him the number for Housing authority in [place].

## 2012-01-19 NOTE — ED Notes (Signed)
Pt accepted at [facility] by Troy watt, PA. Attending dr walker. Room 304-1

## 2012-01-19 NOTE — H&P (Signed)
Pt seen and evaluated upon admission.  Completed Admission Suicide Risk Assessment.  See orders.  Pt agreeable with plan.  Discussed with team.   

## 2012-01-19 NOTE — Progress Notes (Signed)
Cosigned by Carney Bern, LCSWA 4/18/20133:13 PM
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
## 2012-01-19 NOTE — Progress Notes (Signed)
01/19/2012         Time: 1415      Group Topic/Focus: The focus of this group is on discussing various styles of communication and communicating assertively using 'I' (feeling) statements.  Participation Level: Active  Participation Quality: Attentive  Affect: Appropriate  Cognitive: Alert  Additional Comments: None.    Janice Seales 01/19/2012 4:00 PM

## 2012-01-19 NOTE — Progress Notes (Signed)
[facility] Group Notes:  (Counselor/Nursing/MHT/Case Management/Adjunct)  01/19/2012 3:14 PM  Type of Therapy:  Group Therapy at 1:15  Participation Level:  Minimal  Participation Quality:  Attentive  Affect:  Depressed  Cognitive:  Oriented  Insight:  Limited  Engagement in Group:  Limited  Engagement in Therapy:  Uncertain  Modes of Intervention:  Clarification, Socialization and Support  Summary of Progress/Problems:  Patient was attentive and quiet during group yet did participate in group activity in which he choose two photos to represent extremes of balance in life. The first pictured a woman behind a door with small window which he shared represents life out of balance and stated "It's not good to be behind locked doors" The second phot was of children running happily in the park with balloons and Cesare shared it reminded him of happiness.Patient was somewhat preoccupied with body, scratching it; retying gowns and looking down front of gown.    Clide Dales 01/19/2012, 3:23 PM

## 2012-01-19 NOTE — ED Notes (Signed)
Pt requesting something to help him sleep & something for gas. EDP notified.
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
## 2012-01-19 NOTE — Tx Team (Signed)
Initial Interdisciplinary Treatment Plan  PATIENT STRENGTHS: (choose at least two) Ability for insight Average or above average intelligence General fund of knowledge Motivation for treatment/growth  PATIENT STRESSORS: Financial difficulties Health problems   PROBLEM LIST: Problem List/Patient Goals Date to be addressed Date deferred Reason deferred Estimated date of resolution  Depression      Suicidal Ideation                                                 DISCHARGE CRITERIA:  Motivation to continue treatment in a less acute level of care  PRELIMINARY DISCHARGE PLAN: Outpatient therapy  PATIENT/FAMIILY INVOLVEMENT: This treatment plan has been presented to and reviewed with the patient, Bruce Goodman, and/or family member.  The patient and family have been given the opportunity to ask questions and make suggestions.  [name] [facility] 01/19/2012, 4:23 AM
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
## 2012-01-19 NOTE — Progress Notes (Signed)
[facility] Group Notes:  (Counselor/Nursing/MHT/Case Management/Adjunct)  01/19/2012 2:38 PM  Type of Therapy:  Group Therapy  Participation Level:  Minimal  Participation Quality:  Appropriate and Attentive  Affect:  Depressed  Cognitive:  Oriented  Insight:  Limited  Engagement in Group:  Limited  Engagement in Therapy:  Limited  Modes of Intervention:  Clarification, Education, Support and Exploration  Summary of Progress/Problems: Patient reports he is trying to stay clean from weed, but understands it is going to be a hard road. Patient presented very little verbal participation in group discussion around life and balance.   Wilmon Arms 01/19/2012, 2:38 PM

## 2012-01-19 NOTE — ED Notes (Signed)
Report given to carelink 

## 2012-01-20 DIAGNOSIS — F259 Schizoaffective disorder, unspecified: Secondary | ICD-10-CM

## 2012-01-20 NOTE — Progress Notes (Signed)
Patient ID: Bruce Goodman, male   DOB: 27-Mar-1956, 56 y.o.   MRN: [id number] Pt. Awake, alert, NAD.  Affect and mood are generally bright, with intermittent periods of anxiety.  Reviewed nursing care plan.  Patient denies SI/HI/AVH.  Pt. Pleasant, cooperative, positive for groups.  C/o L sided pain--given tylenol and the pain did improve per patient report.  Given maalox for stomach upset...which also improved per patient report.

## 2012-01-20 NOTE — Progress Notes (Signed)
[facility] Group Notes:  (Counselor/Nursing/MHT/Case Management/Adjunct)  01/20/2012 4:31 PM  Type of Therapy:  Group Therapy  Participation Level:  Active  Participation Quality:  Attentive and Sharing  Affect:  Appropriate  Cognitive:  Alert and Oriented  Insight:  Limited  Engagement in Group:  Good  Engagement in Therapy:  Limited  Modes of Intervention:  Education, Socialization and Support  Summary of Progress/Problems:  Bruce Goodman was attentive to discussion on Post Acute Withdrawal Syndrome and able to relate although he is here for depression.  Patient was able to relate to isolation, letting a bad day become a bad week and tendency to not ask for help.    Clide Dales 01/20/2012, 4:31 PM

## 2012-01-20 NOTE — Progress Notes (Signed)
[facility] Group Notes:  (Counselor/Nursing/MHT/Case Management/Adjunct)  01/20/2012 3:54 PM  Type of Therapy:  Group Therapy at 13:15  Participation Level:  Minimal  Participation Quality:  Appropriate and Attentive  Affect:  Appropriate  Cognitive:  Appropriate  Insight:  Limited  Engagement in Group:  Limited  Engagement in Therapy:  Limited  Modes of Intervention:  Education and Exploration  Summary of Progress/Problems: In discussing feelings associated with relapse prevention, patient reported he would often experience symptoms such as stress, loneliness, and impatience. Patient stated warning signs of relapse that he encountered in the past was loneliness, lies, and heartbreak. Patient reported contacting a sponsor in an emergency situation. Patient seemed to relate well with peers in discussing relapse prevention.   Bruce Goodman 01/20/2012, 3:54 PM

## 2012-01-20 NOTE — Progress Notes (Signed)
Pt is a pleasant 56 yr old male who has been calm and cooperative this evening. Pt attended Karaoke this evening and reports having a real good time. Pt denied any feelings of depression or anxiety at this time. Pt was well aware of his meds and their indications this evening. Will follow-up with pt in the am with sleep success. Continued support and availability as needed has been extended to this pt. Pt safety remains with q38min checks.

## 2012-01-20 NOTE — Progress Notes (Signed)
Cosigned by Rosie Golson C Tulio Facundo, LCSWA 4/19/20134:30 PM   

## 2012-01-20 NOTE — Progress Notes (Signed)
[facility] Adult Inpatient Family/Significant Other Suicide Prevention Education  Suicide Prevention Education:  Education Completed; Bruce Goodman at [phone] has been identified by the patient as the family member/significant other with whom the patient will be residing, and identified as the person(s) who will aid the patient in the event of a mental health crisis (suicidal ideations/suicide attempt).  With written consent from the patient, the family member/significant other has been provided the following suicide prevention education, prior to the and/or following the discharge of the patient.  The suicide prevention education provided includes the following:  Suicide risk factors  Suicide prevention and interventions  National Suicide Hotline telephone number  [facility] assessment telephone number  [facility] Emergency Assistance 911  [facility] and/or Residential Mobile Crisis Unit telephone number  Request made of family/significant other to:  Remove weapons (e.g., guns, rifles, knives), all items previously/currently identified as safety concern.    Remove drugs/medications (over-the-counter, prescriptions, illicit drugs), all items previously/currently identified as a safety concern.  The family member/significant other verbalizes understanding of the suicide prevention education information provided.  The family member/significant other agrees to remove the items of safety concern listed above.  Bruce Goodman 01/20/2012, 3:37 PM
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
## 2012-01-20 NOTE — Discharge Planning (Signed)
Pt present in morning group. Good mood, good participation. Reports good sleep last night. Pt reports that [place] to the woods at [place], and possibly live with his daughter in [place] if she breaks up with her boyfriend like she says she will.  Some sedation from new meds.  Follow up at [facility].  Pt rates his depression and anxiety at a 0 and denies SI/HI.

## 2012-01-20 NOTE — Progress Notes (Signed)
[facility] MD Progress Note  01/20/2012 6:26 PM  Diagnosis:  Schizoaffective disorder  ADL's:  Intact  Sleep: Good  Appetite:  Good  Suicidal Ideation:  denies Homicidal Ideation:  denies  AEB (as evidenced by): Pt. Notes that he feels much better today after having some sleep last night. Mental Status Examination/Evaluation: Objective:  Appearance: Fairly Groomed  Eye Contact::  Good  Speech:  Normal Rate  Volume:  Normal  Mood:  Depressed  Affect:  Appropriate  Thought Process:  Circumstantial, Irrelevant and Loose  Orientation:  Full  Thought Content:  WDL  Suicidal Thoughts:  No  Homicidal Thoughts:  No  Memory:  Immediate;   Fair  Judgement:  Fair  Insight:  Fair  Psychomotor Activity:  Normal  Concentration:  Fair  Recall:  Poor  Akathisia:  No  Handed:    AIMS (if indicated):     Assets:  Desire for Improvement  Sleep:  Number of Hours: 3.25    Vital Signs:Blood pressure 124/80, pulse 86, temperature 96.9 F (36.1 C), temperature source Oral, resp. rate 20, height 5' 10.5" (1.791 m), weight 75.297 kg (166 lb). Current Medications: Current Facility-Administered Medications  Medication Dose Route Frequency Provider Last Rate Last Dose  . acetaminophen (TYLENOL) tablet 650 mg  650 mg Oral Q6H PRN Verne Spurr, PA-C   650 mg at 01/20/12 1709  . alum & mag hydroxide-simeth (MAALOX/MYLANTA) 200-200-20 MG/5ML suspension 30 mL  30 mL Oral Q4H PRN Verne Spurr, PA-C   30 mL at 01/20/12 1709  . magnesium hydroxide (MILK OF MAGNESIA) suspension 30 mL  30 mL Oral Daily PRN Verne Spurr, PA-C      . mirtazapine (REMERON) tablet 15 mg  15 mg Oral QHS Alyson Kuroski-Mazzei, DO   15 mg at 01/19/12 2156  . OLANZapine zydis (ZYPREXA) disintegrating tablet 5 mg  5 mg Oral QHS Alyson Kuroski-Mazzei, DO   5 mg at 01/19/12 2156    Lab Results: No results found for this or any previous visit (from the past 48 hour(s)).  Physical Findings: AIMS:  , ,  ,  ,    CIWA:    COWS:      Treatment Plan Summary: Daily contact with patient to assess and evaluate symptoms and progress in treatment Medication management  Plan:  1. Will continue current plan of care and anticipate discharge early next week when more information can be gathered about his intentions upon discharge.  Vinaya Sancho 01/20/2012, 6:26 PM

## 2012-01-20 NOTE — Treatment Plan (Signed)
Interdisciplinary Treatment Plan Update (Adult)  Date: 01/20/2012  Time Reviewed: 10:09 AM   Progress in Treatment: Attending groups: Yes Participating in groups: Yes Taking medication as prescribed: Yes Tolerating medication: Yes   Family/Significant other contact made:     Patient understands diagnosis:  Yes  As evidenced by asking for help with cannabis dependence, depression Discussing patient identified problems/goals with staff:  Yes  See below Medical problems stabilized or resolved:  Yes Denies suicidal/homicidal ideation: Yes  In tx team Issues/concerns per patient self-inventory:  No Not filled out  Other:  New problem(s) identified: N/A  Reason for Continuation of Hospitalization: Depression Medication stabilization  Interventions implemented related to continuation of hospitalization: Encourage group attendance and participation,  Monitor medication trial for side effects, effectiveness  Additional comments:  Estimated length of stay:1-3 days  Discharge Plan: Return home, follow up [facility]  New goal(s): N/A  Review of initial/current patient goals per problem list:   1.  Goal(s):Eliminate SI  Met:  Yes  Target date:4/19  As evidenced [id number] denies SI in tx team  2.  Goal (s):Decrease depression  Met:  No  Target date:4/21  As evidenced JY:[id number] will report depression at 4 or less on self inventory  3.  Goal(s):Identify comprehensive sobriety plan  Met:  No  Target date:4/21  As evidenced ZH:YQMV report  4.  Goal(s):  Met:  Yes  Target date:  As evidenced by:  Attendees: Patient: Bruce Goodman  01/20/2012 10:09 AM  Family:     Physician:  Verne Spurr 01/20/2012 10:09 AM   Nursing:    01/20/2012 10:09 AM   Case Manager:  Richelle Ito, LCSW 01/20/2012 10:09 AM   Counselor:  Ronda Fairly, LCSWA 01/20/2012 10:09 AM   Other:     Other:     Other:     Other:      Scribe for Treatment Team:   Daryel Gerald B,  01/20/2012 10:09 AM

## 2012-01-21 DIAGNOSIS — F39 Unspecified mood [affective] disorder: Principal | ICD-10-CM

## 2012-01-21 DIAGNOSIS — F1121 Opioid dependence, in remission: Secondary | ICD-10-CM

## 2012-01-21 DIAGNOSIS — F1210 Cannabis abuse, uncomplicated: Secondary | ICD-10-CM

## 2012-01-21 NOTE — Progress Notes (Signed)
PATIENT RESTING IN BED WITH EYES CLOSED. RESPIRATIONS EVEN AND NON-LABORED. PATIENT'S CHEST RISES AND FALL WITH EACH BREATH NO DISTRESS NOTED.

## 2012-01-21 NOTE — Progress Notes (Signed)
Pt out in milieu interacting with peers and attending groups.  States depression is "improving" and hopelessness at 5.  Denies SI/HI and contracts for safety.  C/O HA and prn medications requested and received.  Stated he wanted "to come off marijuana".  Appetite and sleep are reported as "good".  Ongoing support offered. Cont current POC and cont 15' checks for safety.

## 2012-01-21 NOTE — Progress Notes (Signed)
Patient ID: Bruce Goodman, male   DOB: 19-Apr-1956, 56 y.o.   MRN: [id number]  [facility] Group Notes:  (Counselor/Nursing/MHT/Case Management/Adjunct)  01/21/2012 1:15 PM  Type of Therapy:  Group Therapy, Dance/Movement Therapy   Participation Level:  None  Participation Quality:  Appropriate  Affect:  Appropriate  Cognitive:  Appropriate  Insight:  Limited  Engagement in Group:  Limited  Engagement in Therapy:  None  Modes of Intervention:  Clarification, Problem-solving, Role-play, Socialization and Support  Summary of Progress/Problems: Therapist discussed how negative thoughts can hinder the healing process. Group discussed ways to maintain positive thoughts instead of reverting to self sabotaging coping skills when encounting feelings of guilt, worthlessness or loneliness.  Pt. entered toward the end of group.  Pt. did not give feedback. Rhunette Croft

## 2012-01-21 NOTE — Progress Notes (Signed)
Patient ID: Bruce Goodman, male   DOB: January 30, 1956, 56 y.o.   MRN: [id number] Pt. attended and participated in aftercare planning group. Pt. accepted information on suicide prevention, warning signs to look for with suicide and crisis line numbers to use. The pt. agreed to call crisis line numbers if having warning signs or having thoughts of suicide. Pt. listed their current anxiety level as 4 and depressions as 2 on a scale of 1 to 10 with 10 as the high. Pt. accepted an AA meeting schedule.

## 2012-01-21 NOTE — Progress Notes (Signed)
  Bruce Goodman is a 56 y.o. male [id number] 02-Jul-1956  01/19/2012 Active Problems:  Chronic mental illness  Homeless   Mental Status: Mood is good denies SI/HI/AVH.    Subjective/Objective: Doesn't have a ride to [place] and is not sure about his followup.    Filed Vitals:   01/21/12 0701  BP: 115/76  Pulse: 93  Temp:   Resp:     Lab Results:   BMET    Component Value Date/Time   NA 139 01/18/2012 1732   K 3.1* 01/18/2012 1732   CL 101 01/18/2012 1732   CO2 31 01/18/2012 1732   GLUCOSE 80 01/18/2012 1732   BUN 9 01/18/2012 1732   CREATININE 0.83 01/18/2012 1732   CALCIUM 9.2 01/18/2012 1732   GFRNONAA >90 01/18/2012 1732   GFRAA >90 01/18/2012 1732    Medications:  Scheduled:     . mirtazapine  15 mg Oral QHS  . OLANZapine zydis  5 mg Oral QHS     PRN Meds acetaminophen, alum & mag hydroxide-simeth, magnesium hydroxide Plan: continue current lan of care.  Bruce Goodman,MICKIE D. 01/21/2012

## 2012-01-22 NOTE — Progress Notes (Signed)
Patient ID: JODI KAPPES, male   DOB: Aug 04, 1956, 56 y.o.   MRN: [id number]  [facility] Group Notes:  (Counselor/Nursing/MHT/Case Management/Adjunct)  01/22/2012 1:15 PM  Type of Therapy:  Group Therapy, Dance/Movement Therapy   Participation Level:  Minimal  Participation Quality:  Appropriate  Affect:  Appropriate  Cognitive:  Appropriate  Insight:  Limited  Engagement in Group:  Limited  Engagement in Therapy:  Limited  Modes of Intervention:  Clarification, Problem-solving, Role-play, Socialization and Support  Summary of Progress/Problems:  Therapist discussed the importance of supports and sponsorship.  Therapist passed out handout and explained expectations of a 12-step meeting.  The importance of finding and using a sponsor as a support related to the theme of the day was discussed. Therapist discussed the importance of having goals and developing coping mechanisms to maintain focus when distracted. Therapist asked group what healthy coping mechanism could you incorporate in your life if you get distracted.  Pt. made eye contact throughout group and seemed to agree by nodding his head to various comments; however, he did not verbally participate.   Rhunette Croft

## 2012-01-22 NOTE — Progress Notes (Signed)
Pt. attended and participated in aftercare planning group. Pt. stated they had already accepted information on suicide prevention, warning signs to look for with suicide and crisis line numbers to use. Therapist verbally went over suicide prevention information and the pt. agreed to call crisis line numbers if having warning signs or having thoughts of suicide. Pt. listed their current anxiety level as a 2 and current depression as a 0. Pt stated that he would like to be outside in the sunshine. Pt expressed worry about not having a ride back home to [place]. Pt was encouraged to think of possibilities and friends that might be able to assist.

## 2012-01-22 NOTE — Progress Notes (Signed)
Met with pt 1:1 this evening. He reports doing well and that his day was "good." His only complaint at this time is moderate pain "under his arm" and indigestion. He was medicated for both with good relief. He denies any psychosis at this time and denies any thoughts of SI or HI. He attended AA group and at present is resting in bed with eyes closed. No distress observed or reported. Bruce Goodman

## 2012-01-22 NOTE — Progress Notes (Signed)
  Bruce Goodman is a 56 y.o. male [id number] July 08, 1956  01/19/2012 Active Problems:  Chronic mental illness  Homeless   Mental Status: Mood is good  Denies SI/HI/AVH.    Subjective/Objective: Worried about how he will get to [place]. Daughters car is broken down and his 93 yo father can't drive in the city. Assured him case management would work out the details.    Filed Vitals:   01/22/12 0839  BP: 121/81  Pulse: 78  Temp:   Resp:     Lab Results:   BMET    Component Value Date/Time   NA 139 01/18/2012 1732   K 3.1* 01/18/2012 1732   CL 101 01/18/2012 1732   CO2 31 01/18/2012 1732   GLUCOSE 80 01/18/2012 1732   BUN 9 01/18/2012 1732   CREATININE 0.83 01/18/2012 1732   CALCIUM 9.2 01/18/2012 1732   GFRNONAA >90 01/18/2012 1732   GFRAA >90 01/18/2012 1732    Medications:  Scheduled:     . mirtazapine  15 mg Oral QHS  . OLANZapine zydis  5 mg Oral QHS     PRN Meds acetaminophen, alum & mag hydroxide-simeth, magnesium hydroxide Plan: help arrange transportation to [place] and follow up tomorrow.  Dustee Bottenfield,MICKIE D. 01/22/2012

## 2012-01-22 NOTE — Progress Notes (Signed)
Pt has been up and has been active while in the milieu today, pt has endorsed thoughts of depression and hopelessness, pt also spoke about not having a place to live, and has spoken about trying to find a sponsor and about being clean and sober, pt has received all medications without incident, support provided, will continue to monitor

## 2012-01-23 DIAGNOSIS — F489 Nonpsychotic mental disorder, unspecified: Secondary | ICD-10-CM | POA: Diagnosis not present

## 2012-01-23 MED ORDER — OLANZAPINE 5 MG PO TBDP: 5.0000 mg | ORAL_TABLET | Freq: Every day | ORAL | Status: DC

## 2012-01-23 MED ORDER — MIRTAZAPINE 15 MG PO TABS: 15.0000 mg | ORAL_TABLET | Freq: Every day | ORAL | Status: DC
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
## 2012-01-23 NOTE — Progress Notes (Signed)
Patient ID: Bruce Goodman, male   DOB: Mar 02, 1956, 56 y.o.   MRN: [id number] Has been pleasant, cooperative, compliant with meds.  No c/o discomfort at this time.  Interacting well with staff and peers, conversational.  [place] this evening as AA group leader didn't show.  Will continue to monitor.

## 2012-01-23 NOTE — Progress Notes (Signed)
Patient ID: Bruce Goodman, male   DOB: [birth year], 56 y.o.   MRN: [id number] He has been up and to groups , interacting with peers and staff. He denies thoughts of SI.   Depression and hopeless at 0 today per self inventory.

## 2012-01-23 NOTE — Discharge Summary (Signed)
Physician Discharge Summary Note  Patient:  Bruce Goodman is an 56 y.o., male MRN:  [id number] DOB:  [birth year] Patient phone:  [phone] (home)  Patient address:   [address] [place] [address],   Date of Admission:  01/19/2012 Date of Discharge: 01/23/2012  Reason for Admission: Depression with SI  Discharge Diagnoses: Active Problems:  Chronic mental illness  Homeless  Axis Diagnosis:  Discharge Diagnoses:  AXIS I: Mood Disorder NOS; r/o BPAD; Opioid Dependence, in reported remission; Cannabis Dependence  AXIS II: Deferred  AXIS III:  Past Medical History   Diagnosis  Date   .  Bipolar 1 disorder    .  Depression    .  Schizophrenia    AXIS IV: Moderate  AXIS V: 45   Level of Care:  OP  Hospital Course:  [name] was admitted for crisis management due to escalating depression related to his non compliance with his medication.  He reported increases in depression with a plan to walk into traffic. Medical problems were identified and treated.  He was perseverating and reported a problem sleeping.  He denied alcohol and drug abuse and reported only occasional use of THC.    Emotional and mental status was monitored by daily self inventory reports completed by the patient and clinical staff.     The patient was evaluated by the treatment team for stability and plans for continued recovery upon discharge. He was offered further treatment options upon discharge including Residential, IOP, and Outpatient treatment.  The patient's motivation was an integral factor for scheduling further treatment.  Employment, transportation, bed availability, health status, family support, and any pending legal issues were also considered.    Upon completion of detox the patient was both mentally and medically stable for discharge.    Consults: None  Significant Diagnostic Studies:  None  Discharge Vitals:   Blood pressure 120/80, pulse 97, temperature 97 F (36.1 C), temperature  source Oral, resp. rate 20, height 5' 10.5" (1.791 m), weight 75.297 kg (166 lb).  Mental Status Exam: See Mental Status Examination and Suicide Risk Assessment completed by Attending Physician prior to discharge.  Discharge destination:  Home  Is patient on multiple antipsychotic therapies at discharge:  No   Has Patient had three or more failed trials of antipsychotic monotherapy by history:  No Recommended Plan for Multiple Antipsychotic Therapies: not applicable     Discharge Orders    Future Orders Please Complete By Expires   Diet - low sodium heart healthy      Increase activity slowly      Discharge instructions      Comments:   Take all medication as prescribed.  Keep your follow up appointment at [facility] to get your medication refilled. It is also imperative that you follow up on your CAT scan in 5 months for the lump in your axillae.     Medication List  As of 01/23/2012 11:56 AM   TAKE these medications      Indication    mirtazapine 15 MG tablet   Commonly known as: REMERON   Take 1 tablet (15 mg total) by mouth at bedtime. For sleep.       OLANZapine zydis 5 MG disintegrating tablet   Commonly known as: ZYPREXA   Take 1 tablet (5 mg total) by mouth at bedtime. For clarity of thought.            Follow-up Information    Follow up with [facility] on 01/25/2012. (8:00AM  for assessment)    Contact information:   [address] [place], [place] [address]  Phone [phone]         Follow-up recommendations:  Activity:  as tolerated, heart healthy diet, regular exercise.  Comments:  Pt. Will follow up with  [facility] as planned.  Signed: Florie Carico 01/23/2012, 11:56 AM

## 2012-01-23 NOTE — Progress Notes (Signed)
[facility] Case Management Discharge Plan:  Will you be returning to the same living situation after discharge: Yes,  [place] At discharge, do you have transportation home?:Yes,  cab Do you have the ability to pay for your medications:Yes,  mental health  Interagency Information:     Release of information consent forms completed and in the chart;  Patient's signature needed at discharge.  Patient to Follow up at:  Follow-up Information    Follow up with [facility] on 01/25/2012. (8:00AM for assessment)    Contact information:   [address] [place], [place] [address]  Phone [phone]         Patient denies SI/HI:   Yes,  yes    Safety Planning and Suicide Prevention discussed:  Yes,  yes  Barrier to discharge identified:No.  Summary and Recommendations:   Bruce Goodman 01/23/2012, 12:15 PM

## 2012-01-23 NOTE — Treatment Plan (Signed)
Interdisciplinary Treatment Plan Update (Adult)  Date: 01/23/2012  Time Reviewed: 8:26 AM   Progress in Treatment: Attending groups: Yes Participating in groups: Yes Taking medication as prescribed: Yes Tolerating medication: Yes   Family/Significant othe contact made:   Patient understands diagnosis:  Yes Discussing patient identified problems/goals with staff:  Yes Medical problems stabilized or resolved:  Yes Denies suicidal/homicidal ideation: Yes In tx team Issues/concerns per patient self-inventory:  None noted, other than Tyeson needing a ride home Other:  New problem(s) identified: N/A  Reason for Continuation of Hospitalization: Other; describe D/C today  Interventions implemented related to continuation of hospitalization:   Additional comments:  Estimated length of stay: D/C today  Discharge Plan: Return home, follow up outpt  New goal(s): N/A  Review of initial/current patient goals per problem list:   1.  Goal(s):Safely detox from alcohol  Met:  Yes  Target date: 4/21  As evidenced YQ:MVHQ score of 0, stable vitals  2.  Goal (s): Eliminate SI  Met:  Yes  Target date:4/22  As evidenced by: Both on self inventory and in tx team, [name] denies  3.  Goal(s):Decrease depression  Met:  Yes  Target date:4/22  As evidenced by: Dayton Scrape reports that his depression is gone, and that the medication has been helpful  4.  Goal(s):  Met:  Yes  Target date:  As evidenced by:  Attendees: Patient:  Bruce Goodman 01/23/2012 8:26 AM  Family:     Physician:  Lupe Carney 01/23/2012 8:26 AM   Nursing: Roswell Miners   01/23/2012 8:26 AM   Case Manager:  Richelle Ito, LCSW 01/23/2012 8:26 AM   Counselor:  Ronda Fairly, LCSWA 01/23/2012 8:26 AM   Other: Verne Spurr  01/23/2012 8:26 AM  Other:     Other:     Other:      Scribe for Treatment Team:   Ida Rogue, 01/23/2012 8:26 AM

## 2012-01-23 NOTE — Progress Notes (Signed)
Patient ID: Bruce Goodman, male   DOB: [birth year], 56 y.o.   MRN: [id number] Pt was discharged home. He voiced understanding of discharge instruction and of follow up.  Denies thoughts of SI  All belonging taken home with him. Was provided taxi cab voucher  For transpiration home along with  A supply of prescribed  Medication.

## 2012-01-27 NOTE — Progress Notes (Signed)
Patient Discharge Instructions:  Psychiatric Admission Assessment Note Provided,  01/25/2012 After Visit Summary (AVS) Provided,  01/25/2012 Face Sheet Provided, 01/25/2012 Faxed/Sent to the Next Level Care provider:  01/25/2012 Provided Suicide Risk Assessment - Discharge Assessment 01/25/2012  Faxed to [facility] @ [phone]  Wandra Scot, 01/27/2012, 9:55 AM

## 2012-01-31 DIAGNOSIS — F39 Unspecified mood [affective] disorder: Secondary | ICD-10-CM | POA: Diagnosis not present

## 2012-02-14 DIAGNOSIS — F39 Unspecified mood [affective] disorder: Secondary | ICD-10-CM | POA: Diagnosis not present

## 2012-04-20 ENCOUNTER — Emergency Department (HOSPITAL_COMMUNITY)
Admission: EM | Admit: 2012-04-20 | Discharge: 2012-04-21 | Disposition: A | Discharge status: Home / Self Care | Payer: Medicare Other | Source: Home / Self Care | Attending: Emergency Medicine | Admitting: Emergency Medicine

## 2012-04-20 ENCOUNTER — Encounter (HOSPITAL_COMMUNITY): Payer: Self-pay | Admitting: *Deleted

## 2012-04-20 DIAGNOSIS — F32A Depression, unspecified: Secondary | ICD-10-CM

## 2012-04-20 DIAGNOSIS — F329 Major depressive disorder, single episode, unspecified: Secondary | ICD-10-CM | POA: Diagnosis not present

## 2012-04-20 DIAGNOSIS — Z87891 Personal history of nicotine dependence: Secondary | ICD-10-CM | POA: Insufficient documentation

## 2012-04-20 DIAGNOSIS — F209 Schizophrenia, unspecified: Secondary | ICD-10-CM | POA: Insufficient documentation

## 2012-04-20 DIAGNOSIS — R45851 Suicidal ideations: Secondary | ICD-10-CM | POA: Diagnosis not present

## 2012-04-20 DIAGNOSIS — F3130 Bipolar disorder, current episode depressed, mild or moderate severity, unspecified: Secondary | ICD-10-CM | POA: Insufficient documentation

## 2012-04-20 LAB — BASIC METABOLIC PANEL
BUN: 9 mg/dL (ref 6–23)
CO2: 29 mEq/L (ref 19–32)
Calcium: 9.9 mg/dL (ref 8.4–10.5)
Chloride: 100 mEq/L (ref 96–112)
Creatinine, Ser: 0.84 mg/dL (ref 0.50–1.35)
GFR calc Af Amer: >90 mL/min (ref >90)
GFR calc non Af Amer: >90 mL/min (ref >90)
Glucose, Bld: 89 mg/dL (ref 70–99)
Potassium: 3.5 mEq/L (ref 3.5–5.1)
Sodium: 139 mEq/L (ref 135–145)

## 2012-04-20 LAB — URINALYSIS, ROUTINE W REFLEX MICROSCOPIC
Bilirubin Urine: NEGATIVE
Glucose, UA: NEGATIVE mg/dL
Hgb urine dipstick: NEGATIVE
Ketones, ur: NEGATIVE mg/dL
Leukocytes, UA: NEGATIVE
Nitrite: NEGATIVE
Protein, ur: NEGATIVE mg/dL
Specific Gravity, Urine: <1.005 — ABNORMAL LOW (ref 1.005–1.030)
Urobilinogen, UA: 0.2 mg/dL (ref 0.0–1.0)
pH: 6 (ref 5.0–8.0)

## 2012-04-20 LAB — CBC WITH DIFFERENTIAL/PLATELET
Basophils Absolute: 0 10*3/uL (ref 0.0–0.1)
Basophils Relative: 0 % (ref 0–1)
Eosinophils Absolute: 0.1 10*3/uL (ref 0.0–0.7)
Eosinophils Relative: 1 % (ref 0–5)
HCT: 45.2 % (ref 39.0–52.0)
Hemoglobin: 15.3 g/dL (ref 13.0–17.0)
Lymphocytes Relative: 28 % (ref 12–46)
Lymphs Abs: 1.8 10*3/uL (ref 0.7–4.0)
MCH: 30.8 pg (ref 26.0–34.0)
MCHC: 33.8 g/dL (ref 30.0–36.0)
MCV: 90.9 fL (ref 78.0–100.0)
Monocytes Absolute: 0.4 10*3/uL (ref 0.1–1.0)
Monocytes Relative: 7 % (ref 3–12)
Neutro Abs: 4.1 10*3/uL (ref 1.7–7.7)
Neutrophils Relative %: 64 % (ref 43–77)
Platelets: 214 10*3/uL (ref 150–400)
RBC: 4.97 MIL/uL (ref 4.22–5.81)
RDW: 14.7 % (ref 11.5–15.5)
WBC: 6.4 10*3/uL (ref 4.0–10.5)

## 2012-04-20 LAB — RAPID URINE DRUG SCREEN, HOSP PERFORMED
Amphetamines: NOT DETECTED
Barbiturates: NOT DETECTED
Benzodiazepines: NOT DETECTED
Cocaine: NOT DETECTED
Opiates: NOT DETECTED
Tetrahydrocannabinol: POSITIVE — AB

## 2012-04-20 LAB — ETHANOL: Alcohol, Ethyl (B): <11 mg/dL (ref 0–11)

## 2012-04-20 MED ORDER — ONDANSETRON HCL 4 MG PO TABS: 4.0000 mg | ORAL_TABLET | Freq: Three times a day (TID) | ORAL | Status: DC | PRN

## 2012-04-20 MED ORDER — OLANZAPINE 5 MG PO TABS
ORAL_TABLET | ORAL | Status: AC
  Filled 2012-04-20: qty 1

## 2012-04-20 MED ORDER — IBUPROFEN 400 MG PO TABS: 600.0000 mg | ORAL_TABLET | Freq: Three times a day (TID) | ORAL | Status: DC | PRN

## 2012-04-20 MED ORDER — GABAPENTIN 100 MG PO CAPS
100.0000 mg | ORAL_CAPSULE | Freq: Three times a day (TID) | ORAL | Status: DC
  Administered 2012-04-20: 100 mg via ORAL
  Filled 2012-04-20 (×5): qty 1

## 2012-04-20 MED ORDER — ACETAMINOPHEN 325 MG PO TABS: 650.0000 mg | ORAL_TABLET | ORAL | Status: DC | PRN

## 2012-04-20 MED ORDER — LORAZEPAM 1 MG PO TABS: 1.0000 mg | ORAL_TABLET | Freq: Three times a day (TID) | ORAL | Status: DC | PRN

## 2012-04-20 MED ORDER — MIRTAZAPINE 15 MG PO TABS
15.0000 mg | ORAL_TABLET | Freq: Every day | ORAL | Status: DC
Start: 2012-04-20 — End: 2012-04-21
  Administered 2012-04-20: 15 mg via ORAL
  Filled 2012-04-20 (×2): qty 1

## 2012-04-20 MED ORDER — ALUM & MAG HYDROXIDE-SIMETH 200-200-20 MG/5ML PO SUSP
30.0000 mL | ORAL | Status: DC | PRN
  Administered 2012-04-20: 30 mL via ORAL
  Filled 2012-04-20: qty 30

## 2012-04-20 MED ORDER — MIRTAZAPINE 15 MG PO TBDP
ORAL_TABLET | ORAL | Status: AC
  Filled 2012-04-20: qty 1

## 2012-04-20 MED ORDER — OLANZAPINE 5 MG PO TBDP
5.0000 mg | ORAL_TABLET | Freq: Every day | ORAL | Status: DC
  Administered 2012-04-20: 5 mg via ORAL
  Filled 2012-04-20 (×2): qty 1

## 2012-04-20 MED ORDER — NICOTINE 21 MG/24HR TD PT24: 21.0000 mg | MEDICATED_PATCH | Freq: Every day | TRANSDERMAL | Status: DC

## 2012-04-20 MED ORDER — ZOLPIDEM TARTRATE 5 MG PO TABS: 5.0000 mg | ORAL_TABLET | Freq: Every evening | ORAL | Status: DC | PRN

## 2012-04-20 MED ORDER — GABAPENTIN 100 MG PO CAPS
ORAL_CAPSULE | ORAL | Status: AC
  Filled 2012-04-20: qty 1

## 2012-04-20 NOTE — ED Notes (Signed)
Dietary called for regular lunch tray for patient.

## 2012-04-20 NOTE — ED Notes (Signed)
ACT/behavioural health is in room with patient at this time.

## 2012-04-20 NOTE — ED Notes (Signed)
Patients reports waking up at 0230 this morning feeling "very depressed" and had thoughts of hurting himself. He reports that he does not "like his father" and that he has been feeling very "lonely" since the death of his mother months ago.  He reports having been sought help in April.  He states that he "wants help" and wants a "regular life again". He has admitted to having "many plans" of hurting himself at home.
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
## 2012-04-20 NOTE — ED Notes (Signed)
Pt is watching tv, sitter at bedside.
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
## 2012-04-20 NOTE — ED Provider Notes (Signed)
History  This chart was scribed for Dione Booze, MD by Gerlean Ren. This patient was seen in room [facility] and the patient's care was started at 1:43PM.  CSN: [id number]  Arrival date & time 04/20/12  1229   First MD Initiated Contact with Patient 04/20/12 1343      Chief Complaint  Patient presents with  . Medical Clearance     The history is provided by the patient. No language interpreter was used.    Bruce Goodman is a 56 y.o. male who presents to the Emergency Department complaining of one week of increased depressive symptoms with suicidal thoughts brought on by interactions with father. He reports that his father keeps "bringing up the past" causing him to not want to live anymore. He states that he has experienced 4 to 5 crying spells today and has been having difficulty sleeping. He reports that his suicidal plan includes either overdosing on his medications or walking out in front of traffic. He is currently on Neurontin, Remeron and Zyprexa and reports taking them as prescribed. He has a recent admission to [facility] two months ago. He denies HI and hallucinations currently. Pt uses marijuana, but reports not using for past 2 weeks.  Pt has history of bipolar 1, depression, and schizophrenia.  Pt denies alcohol and tobacco use.  Past Medical History  Diagnosis Date  . Bipolar 1 disorder   . Depression   . Schizophrenia     Past Surgical History  Procedure Date  . Wrist surgery     right    History reviewed. No pertinent family history.  History  Substance Use Topics  . Smoking status: Former Games developer  . Smokeless tobacco: Not on file  . Alcohol Use: No      Review of Systems  Constitutional: Negative for fever and chills.  Gastrointestinal: Negative for nausea and vomiting.  Psychiatric/Behavioral: Positive for suicidal ideas and disturbed wake/sleep cycle. Negative for hallucinations.  All other systems reviewed and are negative.    Allergies    Haldol and Seroquel  Home Medications   Current Outpatient Rx  Name Route Sig Dispense Refill  . GABAPENTIN 100 MG PO CAPS Oral Take 100 mg by mouth 3 (three) times daily.    Marland Kitchen MIRTAZAPINE 15 MG PO TABS Oral Take 15 mg by mouth at bedtime.    Marland Kitchen OLANZAPINE 5 MG PO TBDP Oral Take 5 mg by mouth at bedtime.      Triage vitals: BP 121/97  Pulse 73  Temp 98.2 F (36.8 C) (Oral)  Resp 20  Ht 5\' 11"  (1.803 m)  Wt 171 lb (77.565 kg)  BMI 23.85 kg/m2  SpO2 100%  Physical Exam  Nursing note and vitals reviewed. Constitutional: He is oriented to person, place, and time. He appears well-developed and well-nourished. No distress.  HENT:  Head: Normocephalic and atraumatic.  Eyes: Conjunctivae and EOM are normal.  Neck: Neck supple. No tracheal deviation present.  Cardiovascular: Normal rate, regular rhythm and normal heart sounds.   Pulmonary/Chest: Effort normal and breath sounds normal. No respiratory distress.  Abdominal: Soft. Bowel sounds are normal. There is no tenderness.  Musculoskeletal: Normal range of motion. He exhibits no edema.  Neurological: He is alert and oriented to person, place, and time.  Skin: Skin is warm and dry.  Psychiatric: His behavior is normal. He exhibits a depressed mood. He expresses suicidal ideation.    ED Course  Procedures (including critical care time)  DIAGNOSTIC STUDIES: Oxygen Saturation is  100% on room air, normal by my interpretation.    COORDINATION OF CARE: 1:55PM- Discussed possibility of getting pt admitted to behavioral health and pt agreed.   Results for orders placed during the hospital encounter of 04/20/12  CBC WITH DIFFERENTIAL      Component Value Range   WBC 6.4  4.0 - 10.5 K/uL   RBC 4.97  4.22 - 5.81 MIL/uL   Hemoglobin 15.3  13.0 - 17.0 g/dL   HCT 16.1  09.6 - 04.5 %   MCV 90.9  78.0 - 100.0 fL   MCH 30.8  26.0 - 34.0 pg   MCHC 33.8  30.0 - 36.0 g/dL   RDW 40.9  81.1 - 91.4 %   Platelets 214  150 - 400 K/uL    Neutrophils Relative 64  43 - 77 %   Neutro Abs 4.1  1.7 - 7.7 K/uL   Lymphocytes Relative 28  12 - 46 %   Lymphs Abs 1.8  0.7 - 4.0 K/uL   Monocytes Relative 7  3 - 12 %   Monocytes Absolute 0.4  0.1 - 1.0 K/uL   Eosinophils Relative 1  0 - 5 %   Eosinophils Absolute 0.1  0.0 - 0.7 K/uL   Basophils Relative 0  0 - 1 %   Basophils Absolute 0.0  0.0 - 0.1 K/uL  BASIC METABOLIC PANEL      Component Value Range   Sodium 139  135 - 145 mEq/L   Potassium 3.5  3.5 - 5.1 mEq/L   Chloride 100  96 - 112 mEq/L   CO2 29  19 - 32 mEq/L   Glucose, Bld 89  70 - 99 mg/dL   BUN 9  6 - 23 mg/dL   Creatinine, Ser 7.82  0.50 - 1.35 mg/dL   Calcium 9.9  8.4 - 95.6 mg/dL   GFR calc non Af Amer >90  >90 mL/min   GFR calc Af Amer >90  >90 mL/min  URINALYSIS, ROUTINE W REFLEX MICROSCOPIC      Component Value Range   Color, Urine YELLOW  YELLOW   APPearance CLEAR  CLEAR   Specific Gravity, Urine <1.005 (*) 1.005 - 1.030   pH 6.0  5.0 - 8.0   Glucose, UA NEGATIVE  NEGATIVE mg/dL   Hgb urine dipstick NEGATIVE  NEGATIVE   Bilirubin Urine NEGATIVE  NEGATIVE   Ketones, ur NEGATIVE  NEGATIVE mg/dL   Protein, ur NEGATIVE  NEGATIVE mg/dL   Urobilinogen, UA 0.2  0.0 - 1.0 mg/dL   Nitrite NEGATIVE  NEGATIVE   Leukocytes, UA NEGATIVE  NEGATIVE  ETHANOL      Component Value Range   Alcohol, Ethyl (B) <11  0 - 11 mg/dL  URINE RAPID DRUG SCREEN (HOSP PERFORMED)      Component Value Range   Opiates NONE DETECTED  NONE DETECTED   Cocaine NONE DETECTED  NONE DETECTED   Benzodiazepines NONE DETECTED  NONE DETECTED   Amphetamines NONE DETECTED  NONE DETECTED   Tetrahydrocannabinol POSITIVE (*) NONE DETECTED   Barbiturates NONE DETECTED  NONE DETECTED     1. Depression   2. Suicidal ideation       MDM  Major depression with suicidal ideation. He will need inpatient psychiatric care.  He has been accepted at [facility] by Dr. Dan Humphreys.  I personally performed the services  described in this documentation, which was scribed in my presence. The recorded information has been reviewed and considered.  Dione Booze, MD 04/20/12 2127

## 2012-04-20 NOTE — ED Notes (Signed)
AC called to bring down scheduled 2200 meds. Will give prior to transfer to [facility] with [facility]

## 2012-04-21 ENCOUNTER — Encounter (HOSPITAL_COMMUNITY): Payer: Self-pay | Admitting: *Deleted

## 2012-04-21 ENCOUNTER — Inpatient Hospital Stay (HOSPITAL_COMMUNITY)
Admission: AD | Admit: 2012-04-21 | Discharge: 2012-04-23 | DRG: 885 | Disposition: A | Discharge status: Home / Self Care | Payer: Medicare Other | Source: Ambulatory Visit | Attending: Psychiatry | Admitting: Psychiatry

## 2012-04-21 DIAGNOSIS — F29 Unspecified psychosis not due to a substance or known physiological condition: Secondary | ICD-10-CM | POA: Diagnosis not present

## 2012-04-21 DIAGNOSIS — F319 Bipolar disorder, unspecified: Secondary | ICD-10-CM

## 2012-04-21 DIAGNOSIS — Z5900 Homelessness unspecified: Secondary | ICD-10-CM

## 2012-04-21 DIAGNOSIS — F99 Mental disorder, not otherwise specified: Secondary | ICD-10-CM

## 2012-04-21 DIAGNOSIS — Z79899 Other long term (current) drug therapy: Secondary | ICD-10-CM

## 2012-04-21 DIAGNOSIS — F1210 Cannabis abuse, uncomplicated: Secondary | ICD-10-CM | POA: Diagnosis present

## 2012-04-21 DIAGNOSIS — Z9149 Other personal history of psychological trauma, not elsewhere classified: Secondary | ICD-10-CM

## 2012-04-21 DIAGNOSIS — F315 Bipolar disorder, current episode depressed, severe, with psychotic features: Principal | ICD-10-CM | POA: Diagnosis present

## 2012-04-21 MED ORDER — ACETAMINOPHEN 325 MG PO TABS: 650.0000 mg | ORAL_TABLET | Freq: Four times a day (QID) | ORAL | Status: DC | PRN

## 2012-04-21 MED ORDER — OLANZAPINE 5 MG PO TBDP
5.0000 mg | ORAL_TABLET | Freq: Every day | ORAL | Status: DC
  Filled 2012-04-21: qty 1

## 2012-04-21 MED ORDER — GABAPENTIN 100 MG PO CAPS
200.0000 mg | ORAL_CAPSULE | Freq: Three times a day (TID) | ORAL | Status: DC
  Administered 2012-04-21 – 2012-04-23 (×5): 200 mg via ORAL
  Filled 2012-04-21 (×8): qty 2

## 2012-04-21 MED ORDER — OLANZAPINE 10 MG PO TBDP
10.0000 mg | ORAL_TABLET | Freq: Every day | ORAL | Status: DC
  Administered 2012-04-21 – 2012-04-22 (×2): 10 mg via ORAL
  Filled 2012-04-21: qty 2
  Filled 2012-04-21 (×3): qty 1

## 2012-04-21 MED ORDER — MAGNESIUM HYDROXIDE 400 MG/5ML PO SUSP: 30.0000 mL | Freq: Every day | ORAL | Status: DC | PRN

## 2012-04-21 MED ORDER — CARBAMAZEPINE 200 MG PO TABS
200.0000 mg | ORAL_TABLET | Freq: Two times a day (BID) | ORAL | Status: DC
  Administered 2012-04-21 – 2012-04-23 (×5): 200 mg via ORAL
  Filled 2012-04-21: qty 4
  Filled 2012-04-21 (×4): qty 1
  Filled 2012-04-21: qty 4
  Filled 2012-04-21 (×3): qty 1

## 2012-04-21 MED ORDER — MIRTAZAPINE 15 MG PO TABS
15.0000 mg | ORAL_TABLET | Freq: Every day | ORAL | Status: DC
  Filled 2012-04-21 (×2): qty 1

## 2012-04-21 MED ORDER — GABAPENTIN 100 MG PO CAPS
100.0000 mg | ORAL_CAPSULE | Freq: Three times a day (TID) | ORAL | Status: DC
  Administered 2012-04-21 (×2): 100 mg via ORAL
  Filled 2012-04-21 (×8): qty 1

## 2012-04-21 MED ORDER — MIRTAZAPINE 30 MG PO TABS
30.0000 mg | ORAL_TABLET | Freq: Every day | ORAL | Status: DC
  Administered 2012-04-21 – 2012-04-22 (×2): 30 mg via ORAL
  Filled 2012-04-21 (×3): qty 1

## 2012-04-21 MED ORDER — ALUM & MAG HYDROXIDE-SIMETH 200-200-20 MG/5ML PO SUSP: 30.0000 mL | ORAL | Status: DC | PRN

## 2012-04-21 MED ORDER — NICOTINE 21 MG/24HR TD PT24
21.0000 mg | MEDICATED_PATCH | Freq: Every day | TRANSDERMAL | Status: DC
  Filled 2012-04-21: qty 1

## 2012-04-21 NOTE — Progress Notes (Signed)
Psychoeducational Group Note  Date:  04/21/2012 Time:  1015  Group Topic/Focus:  Making Healthy Choices:   The focus of this group is to help patients identify negative/unhealthy choices they were using prior to admission and identify positive/healthier coping strategies to replace them upon discharge.  Participation Level:  Minimal  Participation Quality:  Resistant  Affect:  Depressed  Cognitive:  Appropriate  Insight:  Limited  Engagement in Group:  Limited  Additional Comments:    Cresenciano Lick 04/21/2012, 11:43 AM
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
## 2012-04-21 NOTE — Progress Notes (Signed)
D-Patient is out on unit interacting with peers and attending groups. A-Rates depression at 7 and hopelessness at 10. "Off and on" SI and contracts for safety.  Minimal group participation. States housing is his main issue r/t to this stay.No physical complaints and no prn medications requested.R-Support,encouragement and coping skills offered. Continue current POC and evaluation of treatment goals. Continue 15' checks for safety.

## 2012-04-21 NOTE — Progress Notes (Signed)
Patient ID: Bruce Goodman, male   DOB: Feb 16, 1956, 57 y.o.   MRN: [id number] This is a vol. admission of a 56 y.o. D/W/M with a h/o Bipolar D/O. He reports feeling depressed and suicidal for the past 2 days due to things his father has said. He would not elaborate, but stated his father brought things up about the past just to upset him. He lives in a tent on his father's property and uses his father's home to shower and other amenities. The patient has been on disability for many years due to a wrist injury he sustained at work. Denies any substance abuse other than THC which he has not used in the past two days. Denies any A/V hallucinations at present, but states in the past he has experienced both. Denies any significant medical history other than surgery on right wrist several years ago.

## 2012-04-21 NOTE — Progress Notes (Signed)
[facility] Group Notes:  (Counselor/Nursing/MHT/Case Management/Adjunct)  04/21/2012 11:07 AM  Type of Therapy:  Aftercare Planning Group  Patient participated in Aftercare Planning Group. Patient was given [place] Suicide Prevention pamphlet and crisis numbers. Each patient agreed to use the numbers as needed. Bruce Goodman stated that he had not slept due to having arrived early this morning. He stated that his medications aren't working and he is having increased depression. He denied having any SI/HI today. He was in [facility] in April for depression as well. He is followed by [facility] in [place] by Dr. Rosalia Hammers. He has an appointment on Monday that he will miss due to being still in the hospital. He requested that [facility] be contacted to notify them that he will miss his appointment and to get another appointment scheduled.   Lamar [facility] 04/21/2012, 11:07 AM

## 2012-04-21 NOTE — Progress Notes (Signed)
[facility] Adult Inpatient Family/Significant Other Suicide Prevention Education  Suicide Prevention Education:  Patient Refusal for Family/Significant Other Suicide Prevention Education: The patient Bruce Goodman has refused to provide written consent for family/significant other to be provided Family/Significant Other Suicide Prevention Education during admission and/or prior to discharge.  Physician notified.  Pt stated that his daughter does not know he is here yet and that he wants to call her first. Pt agreed to pass on crisis information to her and let staff know if he would like her contacted. Pt. accepted information on suicide prevention, warning signs to look for with suicide and crisis line numbers to use. The pt. agreed to call crisis line numbers if having warning signs or having thoughts of suicide.    [facility] 04/21/2012, 8:31 AM

## 2012-04-21 NOTE — Tx Team (Signed)
Initial Interdisciplinary Treatment Plan  PATIENT STRENGTHS: (choose at least two) Average or above average intelligence Capable of independent living Financial means General fund of knowledge  PATIENT STRESSORS: Marital or family conflict   PROBLEM LIST: Problem List/Patient Goals Date to be addressed Date deferred Reason deferred Estimated date of resolution  Depressed with suicidal ideation 04/21/12                                                      DISCHARGE CRITERIA:  Ability to meet basic life and health needs Adequate post-discharge living arrangements Improved stabilization in mood, thinking, and/or behavior Need for constant or close observation no longer present  PRELIMINARY DISCHARGE PLAN: Outpatient therapy Placement in alternative living arrangements  PATIENT/FAMIILY INVOLVEMENT: This treatment plan has been presented to and reviewed with the patient, Bruce Goodman, Bruce Goodman 04/21/2012, 4:41 AM
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
## 2012-04-21 NOTE — Progress Notes (Signed)
[facility] Group Notes:  (Counselor/Nursing/MHT/Case Management/Adjunct)  04/21/2012 5:29 PM  Type of Therapy:  Group Therapy  Participation Level:  Active  Participation Quality:  Appropriate, Drowsy, Sharing and Supportive  Affect:  Appropriate  Cognitive:  Appropriate and Oriented  Insight:  Good  Engagement in Group:  Good  Engagement in Therapy:  Good  Modes of Intervention:  Activity, Education, Problem-solving, Support and couseling on self-sabatoging and development of coping skills.   Summary of Progress/Problems: pt participated in group appropriately and that he self-sabatoged by focusing on the negative in everything and never feeling that there was anything positive. Pt was able to identify a positive coping skill of writing down the negative, letting go of it and getting rid of it forever in order to focus on the positive.    Berlin Hun, MSW, LCSW 04/21/2012, 5:29 PM

## 2012-04-21 NOTE — H&P (Signed)
  Pt was seen by me today and I agree with the key elements documented in H&P.  

## 2012-04-21 NOTE — [facility] Suicide Risk Assessment (Signed)
Suicide Risk Assessment  Admission Assessment     Demographic factors:  Assessment Details Time of Assessment: Admission Information Obtained From: Patient Current Mental Status:  Current Mental Status: Suicidal ideation indicated by patient;Self-harm thoughts Loss Factors:  Loss Factors: Decrease in vocational status Historical Factors:  Historical Factors: Prior suicide attempts;Family history of mental illness or substance abuse Risk Reduction Factors:  Risk Reduction Factors: Positive therapeutic relationship  CLINICAL FACTORS:   Bipolar Disorder:   Depressive phase  COGNITIVE FEATURES THAT CONTRIBUTE TO RISK:  Closed-mindedness    SUICIDE RISK:   Moderate:  Frequent suicidal ideation with limited intensity, and duration, some specificity in terms of plans, no associated intent, good self-control, limited dysphoria/symptomatology, some risk factors present, and identifiable protective factors, including available and accessible social support.  PLAN OF CARE:   Assessment:  AXIS I: Bipolar affective disorder with psychotic features.  AXIS II: Deferred  AXIS III:  Past Medical History   Diagnosis  Date   .  Bipolar 1 disorder    .  Depression    .  Schizophrenia     AXIS IV: economic problems, educational problems, housing problems, occupational problems, other psychosocial or environmental problems and problems with primary support group  AXIS V: 11-20 some danger of hurting self or others possible OR occasionally fails to maintain minimal personal hygiene OR gross impairment in communication  Treatment Plan/Recommendations: Admit for safety and stabilization.  Review and reinstate any pertinent home medications for other medical issues.  Increase Zyprexa from 5 mg to 10 mg Q bedtime for mood control.  Increase remeron to 30 mg qhs for mood and depressive symptom Increase neurontin for chroni pain Initiate tegretol 200 mg bid for mood stabilizations.  Obtain Tegretol  levels  Group counseling sessions and activities.  Treatment Plan Summary:  Daily contact with patient to assess and evaluate symptoms and progress in treatment  Medication management  Wonda Cerise 04/21/2012, 12:26 PM

## 2012-04-22 DIAGNOSIS — F315 Bipolar disorder, current episode depressed, severe, with psychotic features: Principal | ICD-10-CM

## 2012-04-22 NOTE — Progress Notes (Signed)
Psychoeducational Group Note  Date:  04/22/2012 Time: 1015  Group Topic/Focus:  Identifying Needs:   The focus of this group is to help patients identify their personal needs that have been historically problematic and identify healthy behaviors to address their needs.  Participation Level:  Minimal  Participation Quality:  Appropriate  Affect:  Appropriate  Cognitive:  Appropriate  Insight:  Limited  Engagement in Group:  Limited  Additional Comments:   Cresenciano Lick 04/22/2012, 3:26 PM

## 2012-04-22 NOTE — Progress Notes (Signed)
[facility] Group Notes:  (Counselor/Nursing/MHT/Case Management/Adjunct)  04/22/2012 1:28 AM  Type of Therapy:  Psychoeducational Skills  Participation Level:  Minimal  Participation Quality:  Attentive  Affect:  Depressed  Cognitive:  Appropriate  Insight:  Limited  Engagement in Group:  Limited  Engagement in Therapy:  Limited  Modes of Intervention:  Education  Summary of Progress/Problems: The pt. stated that he had a good day due in large part to his socializing with his peers. The patient's goal for tomorrow is to continue to socialize and play cards with his peers.    Virgil Lightner S 04/22/2012, 1:28 AM

## 2012-04-22 NOTE — Progress Notes (Signed)
Patient ID: Bruce Goodman, male   DOB: [birth year], 56 y.o.   MRN: [id number]  [facility] Group Notes:  (Counselor/Nursing/MHT/Case Management/Adjunct)  04/22/2012 1:15 PM  Type of Therapy:  Group Therapy, Dance/Movement Therapy   Participation Level:  Minimal  Participation Quality:  Appropriate and Drowsy  Affect:  Appropriate  Cognitive:  Appropriate  Insight:  Limited  Engagement in Group:  Limited  Engagement in Therapy:  Limited  Modes of Intervention:  Clarification, Problem-solving, Role-play, Socialization and Support  Summary of Progress/Problems:Therapist discussed the importance of supports and the reason why supports are needed. Therapist discussed the importance of recognizing supports that are good or bad. Therapist asked group to define the term support and what does support mean to them Therapist asked group to name at least five supports in their lives. Therapist asked group what can you do to support yourself when there are no supports available. Patient stated" supports are someone that can help you alone and they are there for you.  I don't really have support but I can start with support groups".      Rhunette Croft 04/22/2012. 3:54 PM

## 2012-04-22 NOTE — Progress Notes (Signed)
Psychoeducational Group Note  Date:  04/22/2012 Time:  945  Group Topic/Focus:  Spirituality:   The focus of this group is to discuss how one's spirituality can aide in recovery.  Participation Level:  Active  Participation Quality:  Appropriate and Attentive  Affect:  Appropriate  Cognitive:  Alert, Appropriate and Oriented  Insight:  Good  Engagement in Group:  Good  Additional Comments:  Group discussed ways to ground oneself as a way to fill our spirituality. Bruce Goodman stated several ways to ground himself.   Bruce Goodman 04/22/2012, 10:49 AM

## 2012-04-22 NOTE — Progress Notes (Signed)
[facility] MD Progress Note  04/22/2012 3:34 PM  S: "Feeling a lot better. I feel a little shaky today. But I was able to meet with group this am. I did sleep a little bit last night".  Diagnosis:   Axis I: Bipolar affective disorder, depressed, severe, with psychotic behavior Axis II: Deferred Axis III:  Past Medical History  Diagnosis Date  . Bipolar 1 disorder   . Depression   . Schizophrenia    Axis IV: economic problems, housing problems, occupational problems and other psychosocial or environmental problems Axis V: 51-60 moderate symptoms  ADL's:  Intact  Sleep: Good  Appetite:  Good  Suicidal Ideation:  Plan:  No No No Homicidal Ideation:  Plan:  No Intent:  no Means:  No  AEB (as evidenced by): Per paptient  Mental Status Examination/Evaluation: Objective:  Appearance: Casual  Eye Contact::  Good  Speech:  Clear and Coherent  Volume:  Normal  Mood:  Anxious  Affect:  Flat  Thought Process:  Coherent  Orientation:  Full  Thought Content:  Rumination  Suicidal Thoughts:  No  Homicidal Thoughts:  No  Memory:  Immediate;   Good Recent;   Good Remote;   Good  Judgement:  Fair  Insight:  Fair  Psychomotor Activity:  "I'm a little anxiety"  Concentration:  Fair  Recall:  Good  Akathisia:  No  Handed:  Right  AIMS (if indicated):     Assets:  Desire for Improvement  Sleep:  Number of Hours: 5.75    Vital Signs:Blood pressure 116/79, pulse 84, temperature 96.6 F (35.9 C), temperature source Oral, resp. rate 16, height 5\' 11"  (1.803 m), weight 75.751 kg (167 lb). Current Medications: Current Facility-Administered Medications  Medication Dose Route Frequency Provider Last Rate Last Dose  . acetaminophen (TYLENOL) tablet 650 mg  650 mg Oral Q6H PRN Mike Craze, MD      . alum & mag hydroxide-simeth (MAALOX/MYLANTA) 200-200-20 MG/5ML suspension 30 mL  30 mL Oral Q4H PRN Mike Craze, MD      . carbamazepine (TEGRETOL) tablet 200 mg  200 mg Oral BID  Sanjuana Kava, NP   200 mg at 04/22/12 0748  . gabapentin (NEURONTIN) capsule 200 mg  200 mg Oral TID Wonda Cerise, MD   200 mg at 04/22/12 1145  . magnesium hydroxide (MILK OF MAGNESIA) suspension 30 mL  30 mL Oral Daily PRN Mike Craze, MD      . mirtazapine (REMERON) tablet 30 mg  30 mg Oral QHS Wonda Cerise, MD   30 mg at 04/21/12 2048  . OLANZapine zydis (ZYPREXA) disintegrating tablet 10 mg  10 mg Oral QHS Sanjuana Kava, NP   10 mg at 04/21/12 2049    Lab Results: No results found for this or any previous visit (from the past 48 hour(s)).  Physical Findings: AIMS: Facial and Oral Movements Muscles of Facial Expression: None, normal Lips and Perioral Area: None, normal Jaw: None, normal Tongue: None, normal,Extremity Movements Upper (arms, wrists, hands, fingers): None, normal Lower (legs, knees, ankles, toes): None, normal, Trunk Movements Neck, shoulders, hips: None, normal, Overall Severity Severity of abnormal movements (highest score from questions above): None, normal Incapacitation due to abnormal movements: None, normal Patient's awareness of abnormal movements (rate only patient's report): No Awareness, Dental Status Current problems with teeth and/or dentures?: Yes (dentures don't fit) Does patient usually wear dentures?: No  CIWA:    COWS:     Treatment Plan Summary: Daily contact  with patient to assess and evaluate symptoms and progress in treatment Medication management  Plan: Continue current treatment plan.  Armandina Stammer I 04/22/2012, 3:34 PM

## 2012-04-22 NOTE — Progress Notes (Signed)
D-Pt is out in milieu interacting with peers and attending groups.  A-Rates depression and hopelessness at 6.  Reported skin rash but no prn medications requested. Denies SI. No overt s/s of etoh withdrawal. No other physical  complaints. R- Support and encouragement given. Continue current POC and evaluation of goals. Continue 15' checks for safety.

## 2012-04-22 NOTE — Progress Notes (Signed)
[facility] Group Notes:  (Counselor/Nursing/MHT/Case Management/Adjunct)  04/22/2012 4:01 PM  Type of Therapy:  After care Planning Group  Pt.  participated in after care planning group and was given [facility] Suicide prevention information and crisis numbers and agreed to use them if needed. Pt. Agreed to use them if needed. Pt. Was also given information on support groups in [employer]. As well as information on the [employer].  The group was told that our monthly theme for today was Healthy supports and booklets were given out and materials in the booklet were explained. Pt. Stated he was not having any problems with SI/HI today. Pt. Sates he slept good and saw PA today.  Lamar Blinks [place] 04/22/2012, 4:01 PM

## 2012-04-22 NOTE — Progress Notes (Signed)
Patient ID: Bruce Goodman, male   DOB: [birth year], 56 y.o.   MRN: [id number] D)  Has been out and about on the hall this evening, participating in the milieu, interacting appropriately with staff and select peers.  Attended group, compliant with meds.  Affect seems depressed and somewhat anxious , but is pleasant and appropriate.  A) Will continue POC, meds as ordered, monitor q 15 minutes for safety.  R) Receptive.

## 2012-04-23 DIAGNOSIS — F315 Bipolar disorder, current episode depressed, severe, with psychotic features: Secondary | ICD-10-CM | POA: Diagnosis not present

## 2012-04-23 MED ORDER — CARBAMAZEPINE 200 MG PO TABS: 200.0000 mg | ORAL_TABLET | Freq: Two times a day (BID) | ORAL | Status: DC

## 2012-04-23 MED ORDER — GABAPENTIN 300 MG PO CAPS: 300.0000 mg | ORAL_CAPSULE | Freq: Three times a day (TID) | ORAL | Status: DC

## 2012-04-23 MED ORDER — OLANZAPINE 10 MG PO TBDP: 10.0000 mg | ORAL_TABLET | Freq: Every day | ORAL | Status: DC

## 2012-04-23 MED ORDER — MIRTAZAPINE 15 MG PO TABS: 7.5000 mg | ORAL_TABLET | Freq: Every day | ORAL | Status: DC

## 2012-04-23 MED ORDER — GABAPENTIN 300 MG PO CAPS
300.0000 mg | ORAL_CAPSULE | Freq: Three times a day (TID) | ORAL | Status: DC
  Administered 2012-04-23: 300 mg via ORAL
  Filled 2012-04-23: qty 6
  Filled 2012-04-23: qty 1
  Filled 2012-04-23 (×2): qty 6

## 2012-04-23 NOTE — Progress Notes (Signed)
[facility] Case Management Discharge Plan:  Will you be returning to the same living situation after discharge: Yes,  patient will return to his father's home At discharge, do you have transportation home?:Yes,  Transportation arranged with [facility] Do you have the ability to pay for your medications:Yes,  Patient has Medicare  Interagency Information:     Release of information consent forms completed and in the chart;  Patient's signature needed at discharge.  Patient to Follow up at:  Follow-up Information    Follow up with [facility] on 04/26/2012. (You are scheduled with [facility] on  Thursday, April 26, 2012 at 7:45 AM)    Contact information:   [address] [place], [place]  [address]  [phone]         Patient denies SI/HI:   Yes,  Patient is not endorsing SI/HI or other thoughts of self harm    Safety Planning and Suicide Prevention discussed:  Yes,  Reviewed during aftercare group  Barrier to discharge identified:  Yes.  Problem with father.  Patient looking for his own housing  Summary and Recommendations: Patient encourage to be compliant with medications and follow up with outpatient recommedations    Deeann Servidio, Joesph July 04/23/2012, 10:40 AM

## 2012-04-23 NOTE — Progress Notes (Signed)
[facility] Group Notes:  (Counselor/Nursing/MHT/Case Management/Adjunct)  04/23/2012 4:41 AM  Type of Therapy:  Psychoeducational Skills  Participation Level:  Active  Participation Quality:  Attentive  Affect:  Excited  Cognitive:  Alert  Insight:  Limited  Engagement in Group:  Good  Engagement in Therapy:  Good  Modes of Intervention:  Education  Summary of Progress/Problems: The pt. Stated in group this evening that he had a good day because he was able to "talk to the ladies and they talked back to me". His goal for tomorrow is to "have a good day".   Hazle Coca S 04/23/2012, 4:41 AM

## 2012-04-23 NOTE — [facility] Counselor (Signed)
Psychoeducational Group Note  Date:  04/23/2012 Time:  11am  Group Topic/Focus:  Dimensions of Wellness:   The focus of this group is to introduce the topic of wellness and discuss the role each dimension of wellness plays in total health.  Participation Level:  Active  Participation Quality:  Appropriate  Affect:  Appropriate and Excited  Cognitive:  Appropriate and Confused  Insight:  Limited  Engagement in Group:  Good  Additional Comments:  Bruce Goodman was engaged in group and reported being in a very good mood. He stated that he is feeling happier than he has in a long time. 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

## 2012-04-23 NOTE — Progress Notes (Signed)
(  D) Patient discharged . Denies SI/HI and psychosis. Positive, forward thinking. (A) AVS reviewed with patient, sample meds given and release of information signed. (R) Medicaid transportation here to pick patient up to transport back to [facility]. All belongings returned. Joice Lofts RN MS EdS 04/23/2012  1:38 PM
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
## 2012-04-23 NOTE — [facility] Suicide Risk Assessment (Signed)
Suicide Risk Assessment  Discharge Assessment     Demographic factors:  Male;Divorced or widowed;Living alone;Access to firearms Unemployed  Current Mental Status Per Nursing Assessment::   On Admission:  Suicidal ideation indicated by patient;Self-harm thoughts At Discharge:     Loss Factors: Decrease in vocational status  Historical Factors: Prior suicide attempts;Family history of mental illness or substance abuse  Risk Reduction Factors:   Sense of responsibility to family;Religious beliefs about death;Living with another person, especially a relative;Positive social support  Continued Clinical Symptoms:  Bipolar Disorder:   Bipolar II Previous Psychiatric Diagnoses and Treatments  Discharge Diagnoses:  Axis I: Bipolar affective disorder, depressed, severe, with psychotic behavior  Axis II: Deferred  Axis III:  Past Medical History   Diagnosis  Date   .  Bipolar 1 disorder    .  Depression    .  Schizophrenia     Axis IV: economic problems, housing problems, occupational problems and other psychosocial or environmental problems  Axis V: 51-60 moderate symptoms   Cognitive Features That Contribute To Risk:  Thought constriction (tunnel vision)    Suicide Risk:  Minimal: No identifiable suicidal ideation.  Patients presenting with no risk factors but with morbid ruminations; may be classified as minimal risk based on the severity of the depressive symptoms  Current Mental Status Per Physician: ADL's: Intact  Sleep: Good  Appetite: Good  Suicidal Ideation:  Plan: No  No  No  Homicidal Ideation:  Plan: No  Intent: no  Means: No  AEB (as evidenced by): Per paptient  Mental Status Examination/Evaluation:  Objective: Appearance: Casual   Eye Contact:: Good   Speech: Clear and Coherent   Volume: Normal   Mood: Euthymic   Affect: Flat   Thought Process: Coherent   Orientation: Full   Thought Content: Rumination   Suicidal Thoughts: No   Homicidal  Thoughts: No   Memory: Immediate; Good  Recent; Good  Remote; Good   Judgement: Fair   Insight: Fair   Psychomotor Activity: WDL   Concentration: Fair   Recall: Good   Akathisia: No   Handed: Right   AIMS (if indicated):   Assets: Desire for Improvement   Sleep: Number of Hours: 5.75   Vital Signs:Blood pressure 116/79, pulse 84, temperature 96.6 F (35.9 C), temperature source Oral, resp. rate 16, height 5\' 11"  (1.803 m), weight 75.751 kg (167 lb).  Current Medications:  Current Facility-Administered Medications   Medication  Dose  Route  Frequency  Provider  Last Rate  Last Dose   .  acetaminophen (TYLENOL) tablet 650 mg  650 mg  Oral  Q6H PRN  Mike Craze, MD     .  alum & mag hydroxide-simeth (MAALOX/MYLANTA) 200-200-20 MG/5ML suspension 30 mL  30 mL  Oral  Q4H PRN  Mike Craze, MD     .  carbamazepine (TEGRETOL) tablet 200 mg  200 mg  Oral  BID  Sanjuana Kava, NP   200 mg at 04/22/12 0748   .  gabapentin (NEURONTIN) capsule 200 mg  200 mg  Oral  TID  Wonda Cerise, MD   200 mg at 04/22/12 1145   .  magnesium hydroxide (MILK OF MAGNESIA) suspension 30 mL  30 mL  Oral  Daily PRN  Mike Craze, MD     .  mirtazapine (REMERON) tablet 30 mg  30 mg  Oral  QHS  Wonda Cerise, MD   30 mg at 04/21/12 2048   .  OLANZapine  zydis (ZYPREXA) disintegrating tablet 10 mg  10 mg  Oral  QHS  Sanjuana Kava, NP   10 mg at 04/21/12 2049    Lab Results: No results found for this or any previous visit (from the past 72 hour(s)).  RISK REDUCTION FACTORS: What pt has learned from hospital stay is to read and use his mind and use the step programs taught here.  Risk of self harm is elevated by his bipolar disorder, but he has decided that he has two daughters and himself to live for.  Risk of harm to others is minimal in that he has not been involved in fights or had any legal charges filed on him.  Pt seen in treatment team where he divulged the above information. The treatment team concluded that  he was ready for discharge and had met his goals for an inpatient setting.   PLAN: Discharge home Continue Medication List  As of 04/23/2012 10:39 PM   TAKE these medications      Indication    carbamazepine 200 MG tablet   Commonly known as: TEGRETOL   Take 1 tablet (200 mg total) by mouth 2 (two) times daily. For mood control       gabapentin 300 MG capsule   Commonly known as: NEURONTIN   Take 1 capsule (300 mg total) by mouth 3 (three) times daily. For anxiety       mirtazapine 15 MG tablet   Commonly known as: REMERON   Take 0.5-1 tablets (7.5-15 mg total) by mouth at bedtime. One half works better for insomnia       OLANZapine zydis 10 MG disintegrating tablet   Commonly known as: ZYPREXA   Take 1 tablet (10 mg total) by mouth at bedtime. For mood control.            Follow-up recommendations:  Activities: Resume typical activities Diet: Resume typical diet Other: Follow up with outpatient provider and report any side effects to out patient prescriber.  Plan:  Pt has met inpatient goals, will D/C today.   Mariamawit Depaoli 04/23/2012, 10:35 PM

## 2012-04-23 NOTE — Tx Team (Signed)
Interdisciplinary Treatment Plan Update (Adult)  Date:  04/23/2012  Time Reviewed:  10:25 AM   Progress in Treatment: Attending groups:   Yes   Participating in groups:  Yes Taking medication as prescribed:  Yes Tolerating medication:  Yes Family/Significant othe contact made: Contact to be made with daughter Patient understands diagnosis:  Yes Discussing patient identified problems/goals with staff: Yes Medical problems stabilized or resolved: Yes Denies suicidal/homicidal ideation:Yes Issues/concerns per patient self-inventory:  Other:  New problem(s) identified:  Reason for Continuation of Hospitalization:  Interventions implemented related to continuation of hospitalization:  Medication Management; safety checks q 15 mins  Additional comments:  Estimated length of stay: d/c today  Discharge Plan:  Return to his father's home and follow up with [facility]  New goal(s):  Review of initial/current patient goals per problem list:    1.  Goal(s): Eliminate SI/other thoughts of self harm   Met:  Yes  Target date: d/c  As evidenced by: Patient no longer endorse SI/other thought self harm  2.  Goal (s): Reduce depression/anxiety (rating at one to zero today)   Met:  Yes  Target date: d/c  As evidenced by: Patient currently rating symptoms at four or below    3.  Goal(s): .stabilize on meds   Met:  Yes  Target date: d/c  As evidenced by: Patient reports being stable on medications - symptoms have decreased    4.  Goal(s): Refer for outpatient follow up   Met:  Yes  Target date: d/c  As evidenced by: Follow up appointment scheduled    Attendees: Patient:  Bruce Goodman 04/23/2012 10:37 AM  Nursing:  Barrie Folk, RN   04/23/2012 10:37 AM  Physician:  Orson Aloe, MD 04/23/2012 10:25 AM   Nursing:   Alease Frame, RN 04/23/2012 10:25 AM   CaseManager:  Juline Patch, LCSW 04/23/2012 10:25 AM   Counselor:  Angus Palms, LCSW 04/23/2012 10:25 AM     Other:  Omelia Blackwater, RN      04/23/2012 10:38 AM Other:  Precious Bard, PhD Candidate     04/23/2012 10:38 AM

## 2012-04-23 NOTE — Progress Notes (Signed)
(  D) Patient reports sleeping "Fair", appetite good, energy level normal. Rates depression 6/10 and hopelessness 7/10. Passive suicidal thoughts voices; patient contracts for safety. Patient reports that he still has a rash in his groin area. Patient presents as anxious and flat, stating that he does not know where he will live at discharge. "You don't know how hard it is to live like I have been living." (A) Patient encouraged to review his available supports at discharge and to review coping skills. Patient also encourage to journal and to seek staff for support as needed. (R) Patient pleasant and cooperative. Verbally contracts for safety. Joice Lofts RN MS EdS 04/23/2012  9:15 AM

## 2012-04-25 NOTE — Progress Notes (Signed)
Patient Discharge Instructions:  After Visit Summary (AVS):   Faxed to:  04/24/2012 Psychiatric Admission Assessment Note:   Faxed to:  04/24/2012 Suicide Risk Assessment - Discharge Assessment:   Faxed to:  04/24/2012 Faxed/Sent to the Next Level Care provider:  04/24/2012  Faxed to [facility] @ [phone]  Heloise Purpura, Eduard Clos, 04/25/2012, 5:51 PM

## 2012-04-27 DIAGNOSIS — F39 Unspecified mood [affective] disorder: Secondary | ICD-10-CM | POA: Diagnosis not present

## 2012-06-12 DIAGNOSIS — F331 Major depressive disorder, recurrent, moderate: Secondary | ICD-10-CM | POA: Diagnosis not present

## 2012-06-27 ENCOUNTER — Emergency Department (HOSPITAL_COMMUNITY)
Admission: EM | Admit: 2012-06-27 | Discharge: 2012-06-28 | Disposition: A | Discharge status: Other Acute Inpatient Hospital | Payer: Medicare Other | Source: Home / Self Care

## 2012-06-27 ENCOUNTER — Encounter (HOSPITAL_COMMUNITY): Payer: Self-pay | Admitting: *Deleted

## 2012-06-27 DIAGNOSIS — R45851 Suicidal ideations: Secondary | ICD-10-CM

## 2012-06-27 DIAGNOSIS — F3289 Other specified depressive episodes: Secondary | ICD-10-CM | POA: Insufficient documentation

## 2012-06-27 DIAGNOSIS — F32A Depression, unspecified: Secondary | ICD-10-CM

## 2012-06-27 DIAGNOSIS — F329 Major depressive disorder, single episode, unspecified: Secondary | ICD-10-CM

## 2012-06-27 DIAGNOSIS — E876 Hypokalemia: Secondary | ICD-10-CM | POA: Insufficient documentation

## 2012-06-27 LAB — URINALYSIS, ROUTINE W REFLEX MICROSCOPIC
Bilirubin Urine: NEGATIVE
Glucose, UA: NEGATIVE mg/dL
Hgb urine dipstick: NEGATIVE
Ketones, ur: NEGATIVE mg/dL
Nitrite: NEGATIVE
Protein, ur: NEGATIVE mg/dL
Specific Gravity, Urine: 1.01 (ref 1.005–1.030)
Urobilinogen, UA: 0.2 mg/dL (ref 0.0–1.0)
pH: 6 (ref 5.0–8.0)

## 2012-06-27 LAB — COMPREHENSIVE METABOLIC PANEL
ALT: 7 U/L (ref 0–53)
AST: 12 U/L (ref 0–37)
Albumin: 3.5 g/dL (ref 3.5–5.2)
Alkaline Phosphatase: 61 U/L (ref 39–117)
BUN: 8 mg/dL (ref 6–23)
CO2: 28 mEq/L (ref 19–32)
Calcium: 9.1 mg/dL (ref 8.4–10.5)
Chloride: 103 mEq/L (ref 96–112)
Creatinine, Ser: 0.89 mg/dL (ref 0.50–1.35)
GFR calc Af Amer: >90 mL/min (ref >90)
GFR calc non Af Amer: >90 mL/min (ref >90)
Glucose, Bld: 95 mg/dL (ref 70–99)
Potassium: 3.3 mEq/L — ABNORMAL LOW (ref 3.5–5.1)
Sodium: 138 mEq/L (ref 135–145)
Total Bilirubin: 0.7 mg/dL (ref 0.3–1.2)
Total Protein: 6.5 g/dL (ref 6.0–8.3)

## 2012-06-27 LAB — RAPID URINE DRUG SCREEN, HOSP PERFORMED
Amphetamines: NOT DETECTED
Barbiturates: NOT DETECTED
Benzodiazepines: NOT DETECTED
Cocaine: NOT DETECTED
Opiates: NOT DETECTED
Tetrahydrocannabinol: POSITIVE — AB

## 2012-06-27 LAB — CBC WITH DIFFERENTIAL/PLATELET
Basophils Absolute: 0 10*3/uL (ref 0.0–0.1)
Basophils Relative: 0 % (ref 0–1)
Eosinophils Absolute: 0.2 10*3/uL (ref 0.0–0.7)
Eosinophils Relative: 3 % (ref 0–5)
HCT: 39.8 % (ref 39.0–52.0)
Hemoglobin: 13.7 g/dL (ref 13.0–17.0)
Lymphocytes Relative: 42 % (ref 12–46)
Lymphs Abs: 2.3 10*3/uL (ref 0.7–4.0)
MCH: 31.3 pg (ref 26.0–34.0)
MCHC: 34.4 g/dL (ref 30.0–36.0)
MCV: 90.9 fL (ref 78.0–100.0)
Monocytes Absolute: 0.4 10*3/uL (ref 0.1–1.0)
Monocytes Relative: 7 % (ref 3–12)
Neutro Abs: 2.6 10*3/uL (ref 1.7–7.7)
Neutrophils Relative %: 48 % (ref 43–77)
Platelets: 174 10*3/uL (ref 150–400)
RBC: 4.38 MIL/uL (ref 4.22–5.81)
RDW: 12.9 % (ref 11.5–15.5)
WBC: 5.4 10*3/uL (ref 4.0–10.5)

## 2012-06-27 LAB — URINE MICROSCOPIC-ADD ON

## 2012-06-27 LAB — ETHANOL: Alcohol, Ethyl (B): <11 mg/dL (ref 0–11)

## 2012-06-27 MED ORDER — ONDANSETRON HCL 4 MG PO TABS: 4.0000 mg | ORAL_TABLET | Freq: Three times a day (TID) | ORAL | Status: DC | PRN

## 2012-06-27 MED ORDER — MIRTAZAPINE 15 MG PO TABS: 15.0000 mg | ORAL_TABLET | Freq: Every day | ORAL | Status: DC

## 2012-06-27 MED ORDER — ZOLPIDEM TARTRATE 5 MG PO TABS: 10.0000 mg | ORAL_TABLET | Freq: Every evening | ORAL | Status: DC | PRN

## 2012-06-27 MED ORDER — GABAPENTIN 300 MG PO CAPS
300.0000 mg | ORAL_CAPSULE | Freq: Three times a day (TID) | ORAL | Status: DC
  Administered 2012-06-27 – 2012-06-28 (×2): 300 mg via ORAL
  Filled 2012-06-27 (×8): qty 1

## 2012-06-27 MED ORDER — NICOTINE 21 MG/24HR TD PT24
21.0000 mg | MEDICATED_PATCH | Freq: Every day | TRANSDERMAL | Status: DC
  Filled 2012-06-27: qty 1

## 2012-06-27 MED ORDER — IBUPROFEN 400 MG PO TABS: 600.0000 mg | ORAL_TABLET | Freq: Three times a day (TID) | ORAL | Status: DC | PRN

## 2012-06-27 MED ORDER — ALUM & MAG HYDROXIDE-SIMETH 200-200-20 MG/5ML PO SUSP: 30.0000 mL | ORAL | Status: DC | PRN

## 2012-06-27 MED ORDER — LORAZEPAM 1 MG PO TABS: 1.0000 mg | ORAL_TABLET | Freq: Three times a day (TID) | ORAL | Status: DC | PRN

## 2012-06-27 MED ORDER — OLANZAPINE 5 MG PO TBDP
10.0000 mg | ORAL_TABLET | Freq: Every day | ORAL | Status: DC
  Administered 2012-06-27: 10 mg via ORAL
  Filled 2012-06-27 (×2): qty 1

## 2012-06-27 MED ORDER — MIRTAZAPINE 15 MG PO TABS
7.5000 mg | ORAL_TABLET | Freq: Every day | ORAL | Status: DC
  Administered 2012-06-27: 15 mg via ORAL
  Filled 2012-06-27 (×3): qty 1

## 2012-06-27 MED ORDER — ACETAMINOPHEN 325 MG PO TABS: 650.0000 mg | ORAL_TABLET | ORAL | Status: DC | PRN

## 2012-06-27 MED ORDER — POTASSIUM CHLORIDE CRYS ER 20 MEQ PO TBCR
40.0000 meq | EXTENDED_RELEASE_TABLET | Freq: Two times a day (BID) | ORAL | Status: DC
  Administered 2012-06-27 – 2012-06-28 (×2): 40 meq via ORAL
  Filled 2012-06-27 (×2): qty 2

## 2012-06-27 NOTE — ED Notes (Signed)
Patient given a drink and some crackers. No complaints at this time, no distress noted. Awaiting further orders at this time.
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
## 2012-06-27 NOTE — ED Provider Notes (Cosign Needed)
History    This chart was scribed for Ward Givens, MD, MD by Smitty Pluck. The patient was seen in room APA16A and the patient's care was started at 7:34PM.   CSN: [id number]  Arrival date & time 06/27/12  [facility]       Chief Complaint  Patient presents with  . V70.1    (Consider location/radiation/quality/duration/timing/severity/associated sxs/prior treatment) The history is provided by the patient. No language interpreter was used.   Bruce Goodman is a 56 y.o. male who presents to the Emergency Department due to depression and SI onset 1 week ago with symptoms worsening in the past 2 days. Pt reports having hx of depression, schizophrenia and bipolar disorder. He states he does not want to live anymore.States he would rather be dead.  He states he had a hard day today and yesterday. He thinks that he will jump in front of a car or train to commit suicide. States he wants to do something "quick". He was admitted to psychiatric hospital in [place] 3 months ago and discharged after 3 days. He states he wasn't ready to be discharged.  He states his thoughts race and he can not sleep without taking neurontin. He gets medication from [facility]. He was at [facility] 1 week ago but did not tell anyone about his SI and depression. He reports living in hotel for 2 months and that he is surrounded by drug addicts and prostitutes. He states he lived with his father for 6 months this years and 6 months 1 year ago. He reports his dad was an alcoholic and that he moved in with him to "get on his nerves." He denies smoking cigarettes. He is on disability due to bipolar disorder and schizophrenia. Denies visual and auditory hallucinations. He states he has money problems due to hotel being high. He states he has a daughter that is in jail due to assaulting a cop and using crack cocaine.   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 Bipolar 1 disorder   . Depression   . Schizophrenia     Past Surgical History  Procedure Date  . Wrist surgery     right    No family history on file.  History  Substance Use Topics  . Smoking status: Former Games developer  . Smokeless tobacco: Not on file  . Alcohol Use: No  on disability +THC last 3 weeks ago homeless living in hotel for 2 months  Review of Systems  Psychiatric/Behavioral: Positive for suicidal ideas.  All other systems reviewed and are negative.    Allergies  Haldol and Seroquel  Home Medications   Current Outpatient Rx  Name Route Sig Dispense Refill  . GABAPENTIN 300 MG PO CAPS Oral Take 1 capsule (300 mg total) by mouth 3 (three) times daily. For anxiety 90 capsule 0  . MIRTAZAPINE 15 MG PO TABS Oral Take 15 mg by mouth at bedtime. One half works better for insomnia    . OLANZAPINE 10 MG PO TBDP Oral Take 1 tablet (10 mg total) by mouth at bedtime. For mood control. 30 tablet 0    BP 127/78  Pulse 86  Temp 98.1 F (36.7 C) (Oral)  Resp 18  Ht 5\' 11"  (1.803 m)  Wt 176 lb (79.833 kg)  BMI 24.55 kg/m2  SpO2 96%  Vital signs normal    Physical Exam  Nursing note and vitals reviewed. Constitutional: He is oriented to person, place, and time. He appears well-developed and well-nourished.  Non-toxic appearance. He does not appear ill. No distress.  HENT:  Head: Normocephalic and atraumatic.  Right Ear: External ear normal.  Left Ear: External ear normal.  Nose: Nose normal. No mucosal edema or rhinorrhea.  Mouth/Throat: Oropharynx is clear and moist and mucous membranes are normal. No dental abscesses or uvula swelling.       Tongue is dry  Eyes: Conjunctivae normal and EOM are normal. Pupils are equal, round, and reactive to light.  Neck: Normal range of motion and full passive range of motion without pain. Neck supple.  Cardiovascular: Normal rate, regular rhythm and normal heart sounds.  Exam reveals no gallop and no  friction rub.   No murmur heard. Pulmonary/Chest: Effort normal and breath sounds normal. No respiratory distress. He has no wheezes. He has no rhonchi. He has no rales. He exhibits no tenderness and no crepitus.  Abdominal: Soft. Normal appearance and bowel sounds are normal. He exhibits no distension. There is no tenderness. There is no rebound and no guarding.  Musculoskeletal: Normal range of motion. He exhibits no edema and no tenderness.       Moves all extremities well.   Neurological: He is alert and oriented to person, place, and time. He has normal strength. No cranial nerve deficit.  Skin: Skin is warm, dry and intact. No rash noted. No erythema. No pallor.  Psychiatric: He has a normal mood and affect. His speech is normal and behavior is normal. His mood appears not anxious.    ED Course  Procedures (including critical care time)   Medications  potassium chloride SA (K-DUR,KLOR-CON) CR tablet 40 mEq (not administered)    DIAGNOSTIC STUDIES: Oxygen Saturation is 96% on room air, normal by my interpretation.    COORDINATION OF CARE: 7:43 PM Discussed ED treatment with pt  7:52PM Spoke with ACT Team Samson Frederic) about pt. Needing evaluation  Results for orders placed during the hospital encounter of 06/27/12  CBC WITH DIFFERENTIAL      Component Value Range   WBC 5.4  4.0 - 10.5 K/uL   RBC 4.38  4.22 - 5.81 MIL/uL   Hemoglobin 13.7  13.0 - 17.0 g/dL   HCT 19.1  47.8 - 29.5 %   MCV 90.9  78.0 - 100.0 fL   MCH 31.3  26.0 - 34.0 pg   MCHC 34.4  30.0 - 36.0 g/dL   RDW 62.1  30.8 - 65.7 %   Platelets 174  150 - 400 K/uL   Neutrophils Relative 48  43 - 77 %   Neutro Abs 2.6  1.7 - 7.7 K/uL   Lymphocytes Relative 42  12 - 46 %   Lymphs Abs 2.3  0.7 - 4.0 K/uL   Monocytes Relative 7  3 - 12 %   Monocytes Absolute 0.4  0.1 - 1.0 K/uL   Eosinophils Relative 3  0 - 5 %   Eosinophils Absolute 0.2  0.0 - 0.7 K/uL   Basophils Relative 0  0 - 1 %   Basophils Absolute 0.0  0.0 -  0.1 K/uL  COMPREHENSIVE METABOLIC PANEL      Component Value Range   Sodium 138  135 - 145 mEq/L   Potassium 3.3 (*) 3.5 - 5.1 mEq/L   Chloride 103  96 - 112 mEq/L   CO2 28  19 - 32 mEq/L   Glucose, Bld 95  70 - 99 mg/dL   BUN 8  6 - 23 mg/dL   Creatinine, Ser 1.61  0.50 - 1.35 mg/dL   Calcium 9.1  8.4 - 09.6 mg/dL   Total Protein 6.5  6.0 - 8.3 g/dL   Albumin 3.5  3.5 - 5.2 g/dL   AST 12  0 - 37 U/L   ALT 7  0 - 53 U/L   Alkaline Phosphatase 61  39 - 117 U/L   Total Bilirubin 0.7  0.3 - 1.2 mg/dL   GFR calc non Af Amer >90  >90 mL/min   GFR calc Af Amer >90  >90 mL/min  ETHANOL      Component Value Range   Alcohol, Ethyl (B) <11  0 - 11 mg/dL  URINALYSIS, ROUTINE W REFLEX MICROSCOPIC      Component Value Range   Color, Urine YELLOW  YELLOW   APPearance CLEAR  CLEAR   Specific Gravity, Urine 1.010  1.005 - 1.030   pH 6.0  5.0 - 8.0   Glucose, UA NEGATIVE  NEGATIVE mg/dL   Hgb urine dipstick NEGATIVE  NEGATIVE   Bilirubin Urine NEGATIVE  NEGATIVE   Ketones, ur NEGATIVE  NEGATIVE mg/dL   Protein, ur NEGATIVE  NEGATIVE mg/dL   Urobilinogen, UA 0.2  0.0 - 1.0 mg/dL   Nitrite NEGATIVE  NEGATIVE   Leukocytes, UA TRACE (*) NEGATIVE  URINE RAPID DRUG SCREEN (HOSP PERFORMED)      Component Value Range   Opiates NONE DETECTED  NONE DETECTED   Cocaine NONE DETECTED  NONE DETECTED   Benzodiazepines NONE DETECTED  NONE DETECTED   Amphetamines NONE DETECTED  NONE DETECTED   Tetrahydrocannabinol POSITIVE (*) NONE DETECTED   Barbiturates NONE DETECTED  NONE DETECTED  URINE MICROSCOPIC-ADD ON      Component Value Range   Squamous Epithelial / LPF RARE  RARE   WBC, UA 0-2  <3 WBC/hpf   Bacteria, UA RARE  RARE    Laboratory interpretation all normal except except positive drug screen, mild hypokalemia   1. Depression   2. Suicidal ideation   3. Hypokalemia     Plan psychiatric admission per ACT   Devoria Albe, MD, FACEP   MDM  I personally performed the services described  in this documentation, which was scribed in my presence. The recorded information has been reviewed and considered.  Devoria Albe, MD, Armando Gang    Ward Givens, MD 06/27/12 2056

## 2012-06-27 NOTE — ED Notes (Addendum)
Reports hx of depression; states "I need my meds adjusted". States, "I need something to slow me down.". When asked what has changed, pt states, "Life".  States, "I just don't want to live".  Per pt, has one daughter in jail, and he has been living in a motel room "that's like a box".

## 2012-06-28 ENCOUNTER — Encounter (HOSPITAL_COMMUNITY): Payer: Self-pay | Admitting: *Deleted

## 2012-06-28 ENCOUNTER — Inpatient Hospital Stay (HOSPITAL_COMMUNITY)
Admission: EM | Admit: 2012-06-28 | Discharge: 2012-07-02 | DRG: 885 | Disposition: A | Discharge status: Home / Self Care | Payer: Medicare Other | Source: Ambulatory Visit | Attending: Psychiatry | Admitting: Psychiatry

## 2012-06-28 DIAGNOSIS — F1210 Cannabis abuse, uncomplicated: Secondary | ICD-10-CM | POA: Diagnosis present

## 2012-06-28 DIAGNOSIS — R5381 Other malaise: Secondary | ICD-10-CM | POA: Diagnosis not present

## 2012-06-28 DIAGNOSIS — F411 Generalized anxiety disorder: Secondary | ICD-10-CM | POA: Diagnosis present

## 2012-06-28 DIAGNOSIS — F333 Major depressive disorder, recurrent, severe with psychotic symptoms: Principal | ICD-10-CM | POA: Diagnosis present

## 2012-06-28 DIAGNOSIS — R5383 Other fatigue: Secondary | ICD-10-CM | POA: Diagnosis not present

## 2012-06-28 DIAGNOSIS — F315 Bipolar disorder, current episode depressed, severe, with psychotic features: Secondary | ICD-10-CM

## 2012-06-28 MED ORDER — LOPERAMIDE HCL 2 MG PO CAPS
2.0000 mg | ORAL_CAPSULE | Freq: Once | ORAL | Status: AC
  Administered 2012-06-28: 2 mg via ORAL
  Filled 2012-06-28: qty 1

## 2012-06-28 MED ORDER — ACETAMINOPHEN 325 MG PO TABS
650.0000 mg | ORAL_TABLET | Freq: Four times a day (QID) | ORAL | Status: DC | PRN
  Administered 2012-06-29 – 2012-07-02 (×4): 650 mg via ORAL

## 2012-06-28 MED ORDER — POTASSIUM CHLORIDE CRYS ER 10 MEQ PO TBCR
10.0000 meq | EXTENDED_RELEASE_TABLET | Freq: Two times a day (BID) | ORAL | Status: AC
  Administered 2012-06-28 – 2012-07-01 (×6): 10 meq via ORAL
  Filled 2012-06-28 (×6): qty 1

## 2012-06-28 MED ORDER — OLANZAPINE 10 MG PO TBDP
10.0000 mg | ORAL_TABLET | Freq: Every day | ORAL | Status: DC
  Administered 2012-06-28 – 2012-07-01 (×4): 10 mg via ORAL
  Filled 2012-06-28 (×5): qty 1

## 2012-06-28 MED ORDER — ALUM & MAG HYDROXIDE-SIMETH 200-200-20 MG/5ML PO SUSP: 30.0000 mL | ORAL | Status: DC | PRN

## 2012-06-28 MED ORDER — MIRTAZAPINE 15 MG PO TABS
15.0000 mg | ORAL_TABLET | Freq: Every day | ORAL | Status: DC
  Administered 2012-06-28: 15 mg via ORAL
  Filled 2012-06-28 (×2): qty 1

## 2012-06-28 MED ORDER — GABAPENTIN 300 MG PO CAPS
300.0000 mg | ORAL_CAPSULE | Freq: Three times a day (TID) | ORAL | Status: DC
  Administered 2012-06-28 – 2012-06-29 (×2): 300 mg via ORAL
  Filled 2012-06-28 (×5): qty 1

## 2012-06-28 MED ORDER — MAGNESIUM HYDROXIDE 400 MG/5ML PO SUSP: 30.0000 mL | Freq: Every day | ORAL | Status: DC | PRN

## 2012-06-28 NOTE — Progress Notes (Addendum)
Patient ID: Bruce Goodman, male   DOB: Aug 02, 1956, 56 y.o.   MRN: [id number] Patient admitted to [facility] due depression and SI.  Patient walked to [facility]. Yesterday due to having suicidal thoughts.  Patient was having terrible thoughts about the world in general and doesn't like where he lives.  He lives in a hotel where there is some prostitutes and drug addicts and he has a difficult time with money as he is on disability.  This is contributing to his depression.  He has a daughter who is incarcerated for drugs.  He was here at [facility] in July for depression also.  He has a problem with housing.  He denies hearing voices.  He denies any SI at this time.  He contracts for safety.  He was oriented to the unit.  Hazle Nordmann, RN.

## 2012-06-28 NOTE — [facility] Counselor (Signed)
Mr Hanway accepted to [facility] by Dr. Lolly Mustache. The attending will be Dr. Rueben Bash. He is assigned to room 502-02. Transportation will be by [facility]. No presert is required as he is a Armed forces operational officer recipient. Support paper work signed, copied and faxed.  Dr Ignacia Palma is in   Agreement with this discharge.

## 2012-06-28 NOTE — ED Notes (Signed)
[facility] called for transport to [facility].  They won't be able to come until latter this evening due to back up of calls.  Suggested that we call local EMS.  Amber the nurse informed and [facility] called.

## 2012-06-28 NOTE — Tx Team (Signed)
Initial Interdisciplinary Treatment Plan  PATIENT STRENGTHS: (choose at least two) Ability for insight Average or above average intelligence Capable of independent living Communication skills General fund of knowledge Physical Health Supportive family/friends  PATIENT STRESSORS: Financial difficulties Occupational concerns   PROBLEM LIST: Problem List/Patient Goals Date to be addressed Date deferred Reason deferred Estimated date of resolution  Depression 06-28-2012   Discharge  Suicidal Ideation 06-28-2012   Discharge                                             DISCHARGE CRITERIA:  Ability to meet basic life and health needs Adequate post-discharge living arrangements Improved stabilization in mood, thinking, and/or behavior Medical problems require only outpatient monitoring Motivation to continue treatment in a less acute level of care Need for constant or close observation no longer present Reduction of life-threatening or endangering symptoms to within safe limits Verbal commitment to aftercare and medication compliance  PRELIMINARY DISCHARGE PLAN: Attend aftercare/continuing care group Outpatient therapy Return to previous living arrangement  PATIENT/FAMIILY INVOLVEMENT: This treatment plan has been presented to and reviewed with the patient, Bruce Goodman.  The patient and family have been given the opportunity to ask questions and make suggestions.  Cranford Mon 06/28/2012, 3:22 PM

## 2012-06-28 NOTE — Progress Notes (Signed)
12:00 PM Pt has been accepted for transfer to [facility] by Dr. Dan Humphreys.

## 2012-06-28 NOTE — Progress Notes (Signed)
Patient ID: Bruce Goodman, male   DOB: Feb 17, 1956, 56 y.o.   MRN: [id number] D: Pt. Smiling, hyper verbal says "I use to not talk, but now I do" "I started talking more at 21 and women like it, they say you talk to [place], not just always into football and stuff."  Pt. Reports depression r/t family issues "I was living with my daughter, then I was living with my mother, then I was living in a motel" "my daughter wants me to come live with her, but her BF..." "I want to help her cause she's handicap, but my mother says time for me to live my life" "I'm on disability, trying to find a part time job, but nobody will hire me." A: Clinical research associate provided emotional support, encouraged client to keep a positive attitude." Writer encouraged pt. To go to [place]. Staff will monitor q66min for safety. R: Pt. Is safe on the unit. Pt. Went to [employer]. "I enjoyed it, I sang a song by [employer]."

## 2012-06-28 NOTE — Progress Notes (Signed)
8:34 AM Sitting up, watching TV.  Says slept poorly last night.  Ate breakfast. No other complaints.  Awaiting placement.

## 2012-06-28 NOTE — ED Notes (Signed)
Pt ate all of his lunch tray. Bruce Goodman

## 2012-06-29 DIAGNOSIS — F314 Bipolar disorder, current episode depressed, severe, without psychotic features: Secondary | ICD-10-CM | POA: Diagnosis not present

## 2012-06-29 DIAGNOSIS — F411 Generalized anxiety disorder: Secondary | ICD-10-CM | POA: Diagnosis present

## 2012-06-29 DIAGNOSIS — F1210 Cannabis abuse, uncomplicated: Secondary | ICD-10-CM | POA: Diagnosis present

## 2012-06-29 MED ORDER — PANTOPRAZOLE SODIUM 20 MG PO TBEC
20.0000 mg | DELAYED_RELEASE_TABLET | Freq: Two times a day (BID) | ORAL | Status: DC
  Administered 2012-06-29 – 2012-07-02 (×6): 20 mg via ORAL
  Filled 2012-06-29 (×8): qty 1

## 2012-06-29 MED ORDER — OLANZAPINE 2.5 MG PO TABS
2.5000 mg | ORAL_TABLET | Freq: Two times a day (BID) | ORAL | Status: DC
  Administered 2012-06-29 – 2012-07-02 (×7): 2.5 mg via ORAL
  Filled 2012-06-29 (×9): qty 1

## 2012-06-29 MED ORDER — MELOXICAM 15 MG PO TABS
7.5000 mg | ORAL_TABLET | Freq: Every day | ORAL | Status: DC
  Administered 2012-06-29: 7.5 mg via ORAL
  Filled 2012-06-29 (×3): qty 1

## 2012-06-29 MED ORDER — MELOXICAM 7.5 MG PO TABS
7.5000 mg | ORAL_TABLET | Freq: Every day | ORAL | Status: DC
  Administered 2012-06-30 – 2012-07-02 (×3): 7.5 mg via ORAL
  Filled 2012-06-29 (×4): qty 1

## 2012-06-29 MED ORDER — GABAPENTIN 300 MG PO CAPS
300.0000 mg | ORAL_CAPSULE | Freq: Three times a day (TID) | ORAL | Status: DC
  Administered 2012-06-29 – 2012-07-02 (×10): 300 mg via ORAL
  Filled 2012-06-29 (×12): qty 1

## 2012-06-29 MED ORDER — MIRTAZAPINE 15 MG PO TABS
7.5000 mg | ORAL_TABLET | Freq: Every day | ORAL | Status: DC
  Administered 2012-06-29 – 2012-07-01 (×3): 7.5 mg via ORAL
  Filled 2012-06-29 (×4): qty 0.5

## 2012-06-29 MED ORDER — OMEGA-3-ACID ETHYL ESTERS 1 G PO CAPS
1.0000 g | ORAL_CAPSULE | Freq: Two times a day (BID) | ORAL | Status: DC
  Administered 2012-06-29 – 2012-07-02 (×6): 1 g via ORAL
  Filled 2012-06-29 (×8): qty 1

## 2012-06-29 MED ORDER — AMITRIPTYLINE HCL 25 MG PO TABS
25.0000 mg | ORAL_TABLET | Freq: Every evening | ORAL | Status: DC | PRN
  Administered 2012-06-29 – 2012-07-01 (×6): 25 mg via ORAL
  Filled 2012-06-29 (×8): qty 1

## 2012-06-29 MED ORDER — PRAZOSIN HCL 1 MG PO CAPS
1.0000 mg | ORAL_CAPSULE | Freq: Every evening | ORAL | Status: DC | PRN
  Administered 2012-06-29 – 2012-07-01 (×2): 1 mg via ORAL
  Filled 2012-06-29: qty 1

## 2012-06-29 NOTE — Progress Notes (Signed)
Psychoeducational Group Note  Date:  06/29/2012 Time:  1100  Group Topic/Focus:  Early Warning Signs:   The focus of this group is to help patients identify signs or symptoms they exhibit before slipping into an unhealthy state or crisis.  Participation Level:  Active  Participation Quality:  Appropriate and Attentive  Affect:  Appropriate  Cognitive:  Appropriate  Insight:  Good  Engagement in Group:  Good  Additional Comments:   Pt attended Early Warning Signs Group. Pt encouraged to share an episode that brought him into the hospital. Pt stated what he was thinking while in crisis. Pt explained signs that were beginning to become unhealthy during the time of crisis. Pt stated warning signs of relapse in categories of behavior, attitude, feeling, and thought changes, and an example to follow. Pt discussed how relasped it effected relationships and personal life.   Karleen Hampshire Brittini 06/29/2012, 1:21 PM

## 2012-06-29 NOTE — Progress Notes (Signed)
Patient ID: Bruce Goodman, male   DOB: 1955-11-16, 56 y.o.   MRN: [id number] [facility] Group Notes: (Counselor/Nursing/MHT/Case Management/Adjunct) 06/29/2012   @1 :15 -2:30pm Preventing Relapse  Type of Therapy:  Group Therapy  Participation Level:  Minimal  Participation Quality: Attentive    Affect:  Blunted  Cognitive:  Appropriate  Insight:  None  Engagement in Group: Minimal  Engagement in Therapy:  None  Modes of Intervention:  Support and Exploration  Summary of Progress/Problems: Lyall was present and seemed very attentive but did not share personally.  Angus Palms, LCSW 06/29/2012 2:23 PM
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
## 2012-06-29 NOTE — Progress Notes (Signed)
Patient did attend the evening karaoke group. Patient participated by singing a song.  

## 2012-06-29 NOTE — Progress Notes (Signed)
06/29/2012         Time: 1500      Group Topic/Focus: The focus of this group is on enhancing patients' problem solving skills, which involves identifying the problem, brainstorming solutions and choosing and trying a solution.  Participation Level: Active  Participation Quality: Appropriate and Attentive  Affect: Blunted  Cognitive: Oriented  Additional Comments: None.   Bruce Goodman 06/29/2012 3:44 PM   

## 2012-06-29 NOTE — Progress Notes (Addendum)
Patient seen during d/c planning group and treatment team.  He advised of admitting to the hospital after becoming depressed and suicidal.  He states he had been staying in a motel and the room began to close in on him and he was having racing thoughts.  He is now denying SI/HI.  He rates depression at four and all other symptoms at two.  He stated he plans to move in with his daughter at discharge.  He will need transportation assistance through [employer] in [place].  Patient shared that he has been THC free for two weeks.

## 2012-06-29 NOTE — Progress Notes (Signed)
  D) Patient pleasant and cooperative upon my assessment. Patient states slept " fair," and  appetite is " improving." Patient rates depression as  5 /10, patient rates hopeless feelings as  6/10. Patient endorses passive SI.  Patient denies HI, denies A/V hallucinations.   A) Patient offered support and encouragement, patient encouraged to discuss feelings/concerns with staff. Patient verbalized understanding. Patient monitored Q15 minutes for safety. Patient met with MD and treatment team to discuss today's goals and plan of care.  R) Patient active on unit, attending groups in day room and meals in dining room.  Patient taking medications as ordered. Will continue to monitor.

## 2012-06-29 NOTE — Progress Notes (Addendum)
[facility] MD Progress Note  06/29/2012 11:34 AM  Diagnosis:   Axis I: Bipolar, Depressed, Generalized Anxiety Disorder, Substance Induced Mood Disorder and Cannabis Dependence Axis II: Deferred Axis III:  Past Medical History  Diagnosis Date  . Bipolar 1 disorder   . Depression   . Schizophrenia    Axis IV: other psychosocial or environmental problems Axis V: 31-40 impairment in reality testing  ADL's:  Intact  Sleep: Poor  Appetite:  Poor  Suicidal Ideation:  Pt had thoughts of being dead prior to coming into hospital. Homicidal Ideation:  Pt denies any thoughts, plans, intent of homicide  AEB (as evidenced by):per pt report  Mental Status Examination/Evaluation: Objective:  Appearance: Disheveled  Eye Contact::  Good  Speech:  Clear and Coherent  Volume:  Normal  Mood:  Anxious, Depressed, Hopeless, Irritable and Worthless  Affect:  Congruent  Thought Process:  Coherent  Orientation:  Full  Thought Content:  WDL  Suicidal Thoughts:  Yes.  without intent/plan  Homicidal Thoughts:  No  Memory:  Immediate;   Fair Recent;   Poor Remote;   Fair  Judgement:  Impaired  Insight:  Lacking  Psychomotor Activity:  Normal  Concentration:  Fair  Recall:  Fair  Akathisia:  No  Handed:  Right  AIMS (if indicated):     Assets:  Communication Skills Desire for Improvement  Sleep:  Number of Hours: 5.5    Vital Signs:Blood pressure 100/70, pulse 104, temperature 97 F (36.1 C), temperature source Oral, resp. rate 20, height 5\' 10"  (1.778 m), weight 77.111 kg (170 lb). Current Medications: Current Facility-Administered Medications  Medication Dose Route Frequency Provider Last Rate Last Dose  . acetaminophen (TYLENOL) tablet 650 mg  650 mg Oral Q6H PRN Curlene Labrum Readling, MD      . alum & mag hydroxide-simeth (MAALOX/MYLANTA) 200-200-20 MG/5ML suspension 30 mL  30 mL Oral Q4H PRN Curlene Labrum Readling, MD      . gabapentin (NEURONTIN) capsule 300 mg  300 mg Oral TID Mike Craze,  MD      . magnesium hydroxide (MILK OF MAGNESIA) suspension 30 mL  30 mL Oral Daily PRN Curlene Labrum Readling, MD      . mirtazapine (REMERON) tablet 7.5 mg  7.5 mg Oral QHS Mike Craze, MD      . OLANZapine [facility]) tablet 2.5 mg  2.5 mg Oral BID AC Mike Craze, MD      . OLANZapine zydis (ZYPREXA) disintegrating tablet 10 mg  10 mg Oral QHS Curlene Labrum Readling, MD   10 mg at 06/28/12 2201  . omega-3 acid ethyl esters (LOVAZA) capsule 1 g  1 g Oral BID Mike Craze, MD      . potassium chloride (K-DUR,KLOR-CON) CR tablet 10 mEq  10 mEq Oral BID Curlene Labrum Readling, MD   10 mEq at 06/29/12 0736  . prazosin (MINIPRESS) capsule 1 mg  1 mg Oral QHS PRN,MR X 1 Mike Craze, MD      . DISCONTD: gabapentin (NEURONTIN) capsule 300 mg  300 mg Oral TID Ronny Bacon, MD   300 mg at 06/29/12 0736  . DISCONTD: mirtazapine (REMERON) tablet 15 mg  15 mg Oral QHS Curlene Labrum Readling, MD   15 mg at 06/28/12 2201   Facility-Administered Medications Ordered in Other Encounters  Medication Dose Route Frequency Provider Last Rate Last Dose  . DISCONTD: acetaminophen (TYLENOL) tablet 650 mg  650 mg Oral Q4H PRN Ward Givens, MD      .  DISCONTD: alum & mag hydroxide-simeth (MAALOX/MYLANTA) 200-200-20 MG/5ML suspension 30 mL  30 mL Oral PRN Ward Givens, MD      . DISCONTD: gabapentin (NEURONTIN) capsule 300 mg  300 mg Oral TID Ward Givens, MD   300 mg at 06/28/12 0955  . DISCONTD: ibuprofen (ADVIL,MOTRIN) tablet 600 mg  600 mg Oral Q8H PRN Ward Givens, MD      . DISCONTD: LORazepam (ATIVAN) tablet 1 mg  1 mg Oral Q8H PRN Ward Givens, MD      . DISCONTD: mirtazapine (REMERON) tablet 7.5-15 mg  7.5-15 mg Oral QHS Ward Givens, MD   15 mg at 06/27/12 2222  . DISCONTD: nicotine (NICODERM CQ - dosed in mg/24 hours) patch 21 mg  21 mg Transdermal Daily Ward Givens, MD      . DISCONTD: OLANZapine zydis (ZYPREXA) disintegrating tablet 10 mg  10 mg Oral QHS Ward Givens, MD   10 mg at 06/27/12 2223  . DISCONTD: ondansetron  (ZOFRAN) tablet 4 mg  4 mg Oral Q8H PRN Ward Givens, MD      . DISCONTD: potassium chloride SA (K-DUR,KLOR-CON) CR tablet 40 mEq  40 mEq Oral BID Ward Givens, MD   40 mEq at 06/28/12 0955  . DISCONTD: zolpidem (AMBIEN) tablet 10 mg  10 mg Oral QHS PRN Ward Givens, MD        Lab Results:  Results for orders placed during the hospital encounter of 06/27/12 (from the past 48 hour(s))  URINALYSIS, ROUTINE W REFLEX MICROSCOPIC     Status: Abnormal   Collection Time   06/27/12  7:47 PM      Component Value Range Comment   Color, Urine YELLOW  YELLOW    APPearance CLEAR  CLEAR    Specific Gravity, Urine 1.010  1.005 - 1.030    pH 6.0  5.0 - 8.0    Glucose, UA NEGATIVE  NEGATIVE mg/dL    Hgb urine dipstick NEGATIVE  NEGATIVE    Bilirubin Urine NEGATIVE  NEGATIVE    Ketones, ur NEGATIVE  NEGATIVE mg/dL    Protein, ur NEGATIVE  NEGATIVE mg/dL    Urobilinogen, UA 0.2  0.0 - 1.0 mg/dL    Nitrite NEGATIVE  NEGATIVE    Leukocytes, UA TRACE (*) NEGATIVE   URINE RAPID DRUG SCREEN (HOSP PERFORMED)     Status: Abnormal   Collection Time   06/27/12  7:47 PM      Component Value Range Comment   Opiates NONE DETECTED  NONE DETECTED    Cocaine NONE DETECTED  NONE DETECTED    Benzodiazepines NONE DETECTED  NONE DETECTED    Amphetamines NONE DETECTED  NONE DETECTED    Tetrahydrocannabinol POSITIVE (*) NONE DETECTED    Barbiturates NONE DETECTED  NONE DETECTED   URINE MICROSCOPIC-ADD ON     Status: Normal   Collection Time   06/27/12  7:47 PM      Component Value Range Comment   Squamous Epithelial / LPF RARE  RARE    WBC, UA 0-2  <3 WBC/hpf    Bacteria, UA RARE  RARE   CBC WITH DIFFERENTIAL     Status: Normal   Collection Time   06/27/12  8:03 PM      Component Value Range Comment   WBC 5.4  4.0 - 10.5 K/uL    RBC 4.38  4.22 - 5.81 MIL/uL    Hemoglobin 13.7  13.0 - 17.0 g/dL  HCT 39.8  39.0 - 52.0 %    MCV 90.9  78.0 - 100.0 fL    MCH 31.3  26.0 - 34.0 pg    MCHC 34.4  30.0 - 36.0 g/dL     RDW 69.6  29.5 - 28.4 %    Platelets 174  150 - 400 K/uL    Neutrophils Relative 48  43 - 77 %    Neutro Abs 2.6  1.7 - 7.7 K/uL    Lymphocytes Relative 42  12 - 46 %    Lymphs Abs 2.3  0.7 - 4.0 K/uL    Monocytes Relative 7  3 - 12 %    Monocytes Absolute 0.4  0.1 - 1.0 K/uL    Eosinophils Relative 3  0 - 5 %    Eosinophils Absolute 0.2  0.0 - 0.7 K/uL    Basophils Relative 0  0 - 1 %    Basophils Absolute 0.0  0.0 - 0.1 K/uL   COMPREHENSIVE METABOLIC PANEL     Status: Abnormal   Collection Time   06/27/12  8:03 PM      Component Value Range Comment   Sodium 138  135 - 145 mEq/L    Potassium 3.3 (*) 3.5 - 5.1 mEq/L    Chloride 103  96 - 112 mEq/L    CO2 28  19 - 32 mEq/L    Glucose, Bld 95  70 - 99 mg/dL    BUN 8  6 - 23 mg/dL    Creatinine, Ser 1.32  0.50 - 1.35 mg/dL    Calcium 9.1  8.4 - 44.0 mg/dL    Total Protein 6.5  6.0 - 8.3 g/dL    Albumin 3.5  3.5 - 5.2 g/dL    AST 12  0 - 37 U/L    ALT 7  0 - 53 U/L    Alkaline Phosphatase 61  39 - 117 U/L    Total Bilirubin 0.7  0.3 - 1.2 mg/dL    GFR calc non Af Amer >90  >90 mL/min    GFR calc Af Amer >90  >90 mL/min   ETHANOL     Status: Normal   Collection Time   06/27/12  8:03 PM      Component Value Range Comment   Alcohol, Ethyl (B) <11  0 - 11 mg/dL     Physical Findings: AIMS: Facial and Oral Movements Muscles of Facial Expression: None, normal Lips and Perioral Area: None, normal Jaw: None, normal Tongue: None, normal,Extremity Movements Upper (arms, wrists, hands, fingers): None, normal Lower (legs, knees, ankles, toes): None, normal, Trunk Movements Neck, shoulders, hips: None, normal, Overall Severity Severity of abnormal movements (highest score from questions above): None, normal Incapacitation due to abnormal movements: None, normal Patient's awareness of abnormal movements (rate only patient's report): No Awareness, Dental Status Current problems with teeth and/or dentures?: Yes Does patient usually wear  dentures?: No (pt. has dentures at home, doesn't wear them)  CIWA:    COWS:     Treatment Plan Summary: Daily contact with patient to assess and evaluate symptoms and progress in treatment Medication management No signs/symptoms of withdrawal from abusable substances. Mood/anxiety less than 3/10 where the scale is 1 is the best and 10 is the worst No suicidal or homicidal thoughts for at least 48 hours.  Plan: Admit, restart Zyprexa at HS and add some during the day for anxiety.  Restart Remeron at lower dose for getting to sleep, use Elavil for  sedation and pain. Add Lovaza for brain recovery and Mobic with Protonix for pain.   Discussed the risks, benefits, and probable clinical course with and without treatment.  Pt is agreeable to the current course of treatment.  Bruce Goodman 06/29/2012, 11:34 AM
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
## 2012-06-29 NOTE — H&P (Signed)
Psychiatric Admission Assessment Adult  Patient Identification:  Bruce Goodman  Date of Evaluation:  06/29/2012  Chief Complaint:  MDD recurrent severe without psychotic features  History of Present Illness: This is a 56 year old caucasian male admitted to [facility] from [facility] ED with reports of complaints of increased depression with suicidal ideation.  Patient has a plan to jump in front of a moving truck.  Patient reports "I feel depressed and I have been depressed for a long time.  But I don't like to be around people.  The reason for my depression started when I was young.  My Dad was an alcoholic.  I think he killed my Mom because of his drinking.  That was what my aunt told me, at a time my mother had a nervous break down and had to go to the hospital for it.  I had a rough childhood.  I was in this hospital not to long ago.  I was seeing a psychiatrist at [facility].  I was put on Zyprexa 15 mg at night, Remeron 15 mg at bed time, and Neurontin 300 mg.  In the beginning my Zyprexa and Remeron will help me sleep, but they don't work no more".      ROS per ED provider: Constitutional: He is oriented to person, place, and time. He appears well-developed and well-nourished. Non-toxic appearance. He does not appear ill. No distress.  HENT:  Head: Normocephalic and atraumatic.  Right Ear: External ear normal.  Left Ear: External ear normal.  Nose: Nose normal. No mucosal edema or rhinorrhea.  Mouth/Throat: Oropharynx is clear and moist and mucous membranes are normal. No dental abscesses or uvula swelling.  Tongue is dry  Eyes: Conjunctivae normal and EOM are normal. Pupils are equal, round, and reactive to light.  Neck: Normal range of motion and full passive range of motion without pain. Neck supple.  Cardiovascular: Normal rate, regular rhythm and normal heart sounds. Exam reveals no gallop and no friction rub.  No murmur heard.  Pulmonary/Chest: Effort normal and breath sounds  normal. No respiratory distress. He has no wheezes. He has no rhonchi. He has no rales. He exhibits no tenderness and no crepitus.  Abdominal: Soft. Normal appearance and bowel sounds are normal. He exhibits no distension. There is no tenderness. There is no rebound and no guarding.  Musculoskeletal: Normal range of motion. He exhibits no edema and no tenderness.  Moves all extremities well.  Neurological: He is alert and oriented to person, place, and time. He has normal strength. No cranial nerve deficit.  Skin: Skin is warm, dry and intact. No rash noted. No erythema. No pallor.  Psychiatric: He has a normal mood and affect. His speech is normal and behavior is normal. His mood appears not anxious.   ED Course      Mood Symptoms:  Mood Swings, Past 2 Weeks, Sadness, SI,  Depression Symptoms:  depressed mood, insomnia,  (Hypo) Manic Symptoms:  Irritable Mood,  Anxiety Symptoms:  Excessive Worry,  Psychotic Symptoms:  Hallucinations: None  PTSD Symptoms: Had a traumatic exposure:  None reported  Past Psychiatric History: Diagnosis:Bipolar affective disorder, depressed, severe, with psychotic behavior, GAD, Cannabis abuse   Hospitalizations: [facility] x 2  Outpatient Care: [facility] with Dr. Rosalia Hammers  Substance Abuse Care: None reported  Self-Mutilation: None reported  Suicidal Attempts: Denies attempts, admits thoughts.  Violent Behaviors: None reported   Past Medical History:   Past Medical History  Diagnosis Date  . Bipolar  1 disorder   . Depression   . Schizophrenia     Allergies:   Allergies  Allergen Reactions  . Haldol (Haloperidol Decanoate)     REACTION: Alters mental status  . Seroquel (Quetiapine Fumerate)     REACTION: UNKNOWN REACTION   PTA Medications: Prescriptions prior to admission  Medication Sig Dispense Refill  . gabapentin (NEURONTIN) 300 MG capsule Take 1 capsule (300 mg total) by mouth 3 (three) times daily. For anxiety  90 capsule  0  .  mirtazapine (REMERON) 15 MG tablet Take 15 mg by mouth at bedtime. One half works better for insomnia      . OLANZapine zydis (ZYPREXA) 10 MG disintegrating tablet Take 1 tablet (10 mg total) by mouth at bedtime. For mood control.  30 tablet  0     Substance Abuse History in the last 12 months: Substance Age of 1st Use Last Use Amount Specific Type  Nicotine Former smoker per documentation.     Alcohol  Denies use of alcohol per previous documentations.     Cannabis (+) on the UDS reports.     Opiates      Cocaine      Methamphetamines      LSD      Ecstasy      Benzodiazepines      Caffeine      Inhalants      Others:                         Consequences of Substance Abuse: Medical Consequences:  Liver damage Legal Consequences:  Arrests, jail time Family Consequences:  Family discord  Social History: Current Place of Residence: [facility] of Birth: [place]  Family Members: "My father and my daughter"  Marital Status:  Divorced  Children: 2  Sons:0  Daughters: 2  Relationships: Divorced  Education:  No high school diploma  Educational Problems/Performance:  Religious Beliefs/Practices:  History of Abuse (Emotional/Phsycial/Sexual)  Occupational Experiences;  Military History:  None.  Legal History: None reported  Hobbies/Interests: None reported  Family History:  History reviewed. No pertinent family history.  Mental Status Examination/Evaluation: Objective:  Appearance: Disheveled  Eye Contact::  Good  Speech:  Clear and Coherent  Volume:  Normal  Mood:  Euthymic  Affect:  Appropriate  Thought Process:  Coherent and Intact  Orientation:  Full  Thought Content:  Rumination  Suicidal Thoughts:  No  Homicidal Thoughts:  No  Memory:  Immediate;   Good  Judgement:  Fair  Insight:  Good  Psychomotor Activity:  Normal  Concentration:  Good  Recall:  Good  Akathisia:  No  Handed:  Right  AIMS (if indicated):     Assets:  Desire for  Improvement  Sleep:  Number of Hours: 5.5     Laboratory/X-Ray: None Psychological Evaluation(s)      Assessment:    AXIS I:  Bipolar affective disorder, depressed, severe, with psychotic behavior, Cannabis abuse, GAD AXIS II:  Deferred AXIS III:   Past Medical History  Diagnosis Date  . Bipolar 1 disorder   . Depression   . Schizophrenia    AXIS IV:  other psychosocial or environmental problems AXIS V:  11-20 some danger of hurting self or others possible OR occasionally fails to maintain minimal personal hygiene OR gross impairment in communication  Treatment Plan/Recommendations: Admit for safety and stabilization. Review and reinstate any pertinent home medications for other health issues. Continue with current treatment plan  already in progress.  Treatment Plan Summary: Daily contact with patient to assess and evaluate symptoms and progress in treatment Medication management  Current Medications:  Current Facility-Administered Medications  Medication Dose Route Frequency Provider Last Rate Last Dose  . acetaminophen (TYLENOL) tablet 650 mg  650 mg Oral Q6H PRN Curlene Labrum Readling, MD      . alum & mag hydroxide-simeth (MAALOX/MYLANTA) 200-200-20 MG/5ML suspension 30 mL  30 mL Oral Q4H PRN Curlene Labrum Readling, MD      . amitriptyline (ELAVIL) tablet 25 mg  25 mg Oral QHS,MR X 1 Mike Craze, MD      . gabapentin (NEURONTIN) capsule 300 mg  300 mg Oral TID Mike Craze, MD   300 mg at 06/29/12 1300  . magnesium hydroxide (MILK OF MAGNESIA) suspension 30 mL  30 mL Oral Daily PRN Curlene Labrum Readling, MD      . meloxicam (MOBIC) tablet 7.5 mg  7.5 mg Oral Daily Mike Craze, MD      . mirtazapine (REMERON) tablet 7.5 mg  7.5 mg Oral QHS Mike Craze, MD      . OLANZapine [facility]) tablet 2.5 mg  2.5 mg Oral BID AC Mike Craze, MD   2.5 mg at 06/29/12 1209  . OLANZapine zydis (ZYPREXA) disintegrating tablet 10 mg  10 mg Oral QHS Curlene Labrum Readling, MD   10 mg at 06/28/12 2201    . omega-3 acid ethyl esters (LOVAZA) capsule 1 g  1 g Oral BID Mike Craze, MD      . pantoprazole (PROTONIX) EC tablet 20 mg  20 mg Oral BID AC Mike Craze, MD      . potassium chloride (K-DUR,KLOR-CON) CR tablet 10 mEq  10 mEq Oral BID Curlene Labrum Readling, MD   10 mEq at 06/29/12 0736  . prazosin (MINIPRESS) capsule 1 mg  1 mg Oral QHS PRN,MR X 1 Mike Craze, MD      . DISCONTD: gabapentin (NEURONTIN) capsule 300 mg  300 mg Oral TID Ronny Bacon, MD   300 mg at 06/29/12 0736  . DISCONTD: meloxicam (MOBIC) tablet 7.5 mg  7.5 mg Oral Daily Mike Craze, MD   7.5 mg at 06/29/12 1209  . DISCONTD: mirtazapine (REMERON) tablet 15 mg  15 mg Oral QHS Curlene Labrum Readling, MD   15 mg at 06/28/12 2201    Observation Level/Precautions:  Q 15 minutes checks for safety  Laboratory:  Per ED lab findings on record, (+) for THC, low potassium levels 3.3.  Psychotherapy:  Group sessions  Medications:  See medication lists  Routine PRN Medications:  Yes  Consultations:  None indicated at this time  Discharge Concerns:  Safety  Other:     Sanjuana Kava 9/27/20133:47 PM

## 2012-06-30 DIAGNOSIS — F315 Bipolar disorder, current episode depressed, severe, with psychotic features: Secondary | ICD-10-CM | POA: Diagnosis not present
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
## 2012-06-30 NOTE — Progress Notes (Signed)
The patient attended the A. A. Meeting on the 300 hallway. 

## 2012-06-30 NOTE — Progress Notes (Signed)
Psychoeducational Group Note  Date: 06/30/2012 Time:  1015  Group Topic/Focus:  Identifying Needs:   The focus of this group is to help patients identify their personal needs that have been historically problematic and identify healthy behaviors to address their needs.  Participation Level:  Active  Participation Quality:  Appropriate  Affect:  Appropriate  Cognitive:  Appropriate  Insight:  Good  Engagement in Group:  Good  Additional Comments:    Lizmarie Witters A  

## 2012-06-30 NOTE — Progress Notes (Signed)
D) Pt states that he is feeling  A bit better. Rates his depression at a 2, hopelessness at a 0 and his anxiety at a 0. Feels positive about going to live with his daughter. Shared that he was diagnosed with a  mark on his lung. Stated that he was told to come back in 6 months and have a CT scan done again. Pt stated "I hope it's not cancer". States that he does not have a primary physician at this time. Denies SI and HI. A) Given support, reassurance and praise. Instructed when he leaves here that he needs to find a doctor and see about this growth he has in his lung.  R) Denies SI and HI. Pleased that he will be living with his daughter again. States that staying in a hotel room by himself is very lonely and he does not want to be alone.

## 2012-06-30 NOTE — Progress Notes (Signed)
Patient ID: Bruce Goodman, male   DOB: 09/22/1956, 56 y.o.   MRN: [id number]  Pt. attended and participated in aftercare planning group. Pt. accepted information on suicide prevention, warning signs to look for with suicide and crisis line numbers to use. The pt. agreed to call crisis line numbers if having warning signs or having thoughts of suicide. Pt shared that he was feeling sleepy but pretty good. No S/I or H/I.

## 2012-06-30 NOTE — Progress Notes (Signed)
[facility] MD Progress Note  06/30/2012 4:21 PM  S: "I am feeling pretty good. Talked to my daughter, now she wants me to come live with her. I might give it a shot. I just do not want to move in with her, and have to turn around move out again. Going to group is helping me. I was living alone not too long ago. That worsened my depression. I do better when there other people around"  Diagnosis:   Axis I: Bipolar affective disorder, depressed, severe, with psychotic behavior Axis II: Deferred Axis III:  Past Medical History  Diagnosis Date  . Bipolar 1 disorder   . Depression   . Schizophrenia    Axis IV: other psychosocial or environmental problems Axis V: 41-50 serious symptoms  ADL's:  Intact  Sleep: Good  Appetite:  Good  Suicidal Ideation: "No" Plan:  No Intent:  No Means:  no Homicidal Ideation: "No" Plan:  No Intent:  No Means:  No  AEB (as evidenced by): Per patient's reports.  Mental Status Examination/Evaluation: Objective:  Appearance: Casual  Eye Contact::  Good  Speech:  Clear and Coherent  Volume:  Normal  Mood:  Euthymic  Affect:  Appropriate  Thought Process:  Coherent  Orientation:  Full  Thought Content:  Rumination  Suicidal Thoughts:  No  Homicidal Thoughts:  No  Memory:  Immediate;   Good Recent;   Good Remote;   Good  Judgement:  Good  Insight:  Good  Psychomotor Activity:  Normal  Concentration:  Good  Recall:  Good  Akathisia:  No  Handed:  Right  AIMS (if indicated):     Assets:  Desire for Improvement  Sleep:  Number of Hours: 6.5    Vital Signs:Blood pressure 92/57, pulse 108, temperature 97.4 F (36.3 C), temperature source Oral, resp. rate 16, height 5\' 10"  (1.778 m), weight 77.111 kg (170 lb). Current Medications: Current Facility-Administered Medications  Medication Dose Route Frequency Provider Last Rate Last Dose  . acetaminophen (TYLENOL) tablet 650 mg  650 mg Oral Q6H PRN Curlene Labrum Readling, MD   650 mg at 06/30/12 1201  .  alum & mag hydroxide-simeth (MAALOX/MYLANTA) 200-200-20 MG/5ML suspension 30 mL  30 mL Oral Q4H PRN Curlene Labrum Readling, MD      . amitriptyline (ELAVIL) tablet 25 mg  25 mg Oral QHS,MR X 1 Mike Craze, MD   25 mg at 06/29/12 2206  . gabapentin (NEURONTIN) capsule 300 mg  300 mg Oral TID Mike Craze, MD   300 mg at 06/30/12 1521  . magnesium hydroxide (MILK OF MAGNESIA) suspension 30 mL  30 mL Oral Daily PRN Curlene Labrum Readling, MD      . meloxicam (MOBIC) tablet 7.5 mg  7.5 mg Oral Daily Mike Craze, MD   7.5 mg at 06/30/12 0844  . mirtazapine (REMERON) tablet 7.5 mg  7.5 mg Oral QHS Mike Craze, MD   7.5 mg at 06/29/12 2107  . OLANZapine (ZYPREXA) tablet 2.5 mg  2.5 mg Oral BID AC Mike Craze, MD   2.5 mg at 06/30/12 2440  . OLANZapine zydis (ZYPREXA) disintegrating tablet 10 mg  10 mg Oral QHS Curlene Labrum Readling, MD   10 mg at 06/29/12 2107  . omega-3 acid ethyl esters (LOVAZA) capsule 1 g  1 g Oral BID Mike Craze, MD   1 g at 06/30/12 0844  . pantoprazole (PROTONIX) EC tablet 20 mg  20 mg Oral BID AC Dorian Heckle  Dan Humphreys, MD   20 mg at 06/30/12 6962  . potassium chloride (K-DUR,KLOR-CON) CR tablet 10 mEq  10 mEq Oral BID Curlene Labrum Readling, MD   10 mEq at 06/30/12 0844  . prazosin (MINIPRESS) capsule 1 mg  1 mg Oral QHS PRN,MR X 1 Mike Craze, MD   1 mg at 06/29/12 2207    Lab Results: No results found for this or any previous visit (from the past 48 hour(s)).  Physical Findings: AIMS: Facial and Oral Movements Muscles of Facial Expression: None, normal Lips and Perioral Area: None, normal Jaw: None, normal Tongue: None, normal,Extremity Movements Upper (arms, wrists, hands, fingers): None, normal Lower (legs, knees, ankles, toes): None, normal, Trunk Movements Neck, shoulders, hips: None, normal, Overall Severity Severity of abnormal movements (highest score from questions above): None, normal Incapacitation due to abnormal movements: None, normal Patient's awareness of abnormal  movements (rate only patient's report): No Awareness, Dental Status Current problems with teeth and/or dentures?: No Does patient usually wear dentures?: No  CIWA:  CIWA-Ar Total: 0  COWS:  COWS Total Score: 0   Treatment Plan Summary: Daily contact with patient to assess and evaluate symptoms and progress in treatment Medication management  Plan: Continue current treatment plan.  Armandina Stammer I 06/30/2012, 4:21 PM

## 2012-06-30 NOTE — Progress Notes (Signed)
Psychoeducational Group Note  Date:  06/30/2012 Time:  1515  Group Topic/Focus:  Coping With Mental Health Crisis:   The purpose of this group is to help patients identify strategies for coping with mental health crisis.  Group discusses possible causes of crisis and ways to manage them effectively. Conflict Resolution:   The focus of this group is to discuss the conflict resolution process and how it may be used upon discharge.  Participation Level:  Active  Participation Quality:  Attentive  Affect:  Appropriate  Cognitive:  Oriented  Insight:  Good  Engagement in Group:  Good  Additional Comments:  Well behaved  Bruce Goodman 06/30/2012, 6:54 PM

## 2012-06-30 NOTE — Progress Notes (Signed)
Patient ID: Bruce Goodman, male   DOB: 1956/06/14, 56 y.o.   MRN: [id number]   [facility] Group Notes:  (Counselor/Nursing/MHT/Case Management/Adjunct)  06/30/2012 1:15 PM  Type of Therapy:  Group Therapy, Dance/Movement Therapy   Participation Level:  Active  Participation Quality:  Appropriate, Attentive and Supportive  Affect:  Appropriate  Cognitive:  Appropriate  Insight:  Good  Engagement in Group:  Good  Engagement in Therapy:  Good  Modes of Intervention:  Clarification, Problem-solving, Role-play, Socialization and Support  Summary of Progress/Problems: Therapist and group members discussed self-sabotaging behaviors as well as healthy coping skills. Group members shared one positive thing to be thankful for today and how it is important to focus on the present. Pt offered support to other group members and shared that he is thankful for support from staff members.      Cassidi Long 06/30/2012. 3:14 PM

## 2012-06-30 NOTE — Progress Notes (Signed)
Patient has been up and active on the unit, attended group this evening and has voiced no complaints. Patient currently denies having pain, -si/hi/a/v hall. Patient reports that his goal after discharge is to stay at a hotel for a bit and then he will be living with his daughter. Patient report that he will not be smoking thc because his daughter doesn't allow it.   Support and encouragement offered, safety maintained on unit, will continue to monitor.

## 2012-06-30 NOTE — Progress Notes (Addendum)
[facility] Adult Inpatient Family/Significant Other Suicide Prevention Education  Suicide Prevention Education:  Contact Attempts: Pt's daughter Bruce Goodman has been identified by the patient as the family member/significant other with whom the patient will be residing, and identified as the person(s) who will aid the patient in the event of a mental health crisis.  With written consent from the patient, two attempts were made to provide suicide prevention education, prior to and/or following the patient's discharge.  We were unsuccessful in providing suicide prevention education.  A suicide education pamphlet was given to the patient to share with family/significant other.  Date and time of first attempt: 06/30/2012 3:52 PM  Vanetta Mulders, LPCA  Date and time of second attempt: 06/30/2012 4:48 PM   Chestine Spore, Lucero Ide L 06/30/2012, 3:51 PM

## 2012-07-01 NOTE — Progress Notes (Signed)
[facility] In Patient Progress Note 07/01/2012 11:03 PM Bruce Goodman 03-Jul-1956 [id number] Hospital day #:3 Diagnosis:   Axis I: Bipolar, Depressed, Generalized Anxiety Disorder, Substance Induced Mood Disorder and Cannabis Dependence  Axis II: Deferred  Axis III:  Past Medical History   Diagnosis  Date   .  Bipolar 1 disorder    .  Depression    .  Schizophrenia    Axis IV: other psychosocial or environmental problems  Axis V: 31-40 impairment in reality testing Patient was seen and reports the following: ADL's:  Intact Sleep:  No change Appetite:ok  Groups:Limited  Subjective: Bruce Goodman reported no new symptoms today.   height is 5\' 10"  (1.778 m) and weight is 77.111 kg (170 lb). His oral temperature is 97.7 F (36.5 C). His blood pressure is 121/63 and his pulse is 99. His respiration is 16.   Objective: Active in the unit milieu.  Ros: Constitutional: WD WN Adult in NAD ENT:      negative for runny nose, sore throat, congestion, dysphagia COR:     negative for cough, SOB, chest pain, wheezing GI:         negative for Nausea, vomiting, diarrhea, constipation, abdominal pain Neuro:  negative for dizziness, blurred vision, headaches, numbness or tingling Ortho:   negative for limb pain, swelling, change in ambulatory status.  Mental Status Exam Level of Consciousness: awake Orientation: x 3 General Appearance:  casual Behavior:  cooperative Eye Contact:  good Motor Behavior:  normal Speech:  clear Mood:  Moderately depressed  Suicidal Ideation: No suicidal ideation, no plan, no intent, no means. Homicidal Ideation:  No homicidal ideation, no plan, no intent, no means.  Affect:  congruent Anxiety Level: mild, moderate, severe Thought Process:  linear Thought Content:  No AVH Perception:  intact Judgment:  limited Insight:  fair,  Cognition:  average Sleep:  Number of Hours: 6.25   Lab Results: No results found for this or any previous visit (from the past 48  hour(s)). Labs are reviewed. Physical Findings: AIMS: CIWA-Ar Total: 0  CIWA:  CIWA-Ar Total: 0  COWS:  COWS Total Score: 0  Medication: . amitriptyline  25 mg Oral QHS,MR X 1  . gabapentin  300 mg Oral TID  . meloxicam  7.5 mg Oral Daily  . mirtazapine  7.5 mg Oral QHS  . OLANZapine  2.5 mg Oral BID AC  . OLANZapine zydis  10 mg Oral QHS  . omega-3 acid ethyl esters  1 g Oral BID  . pantoprazole  20 mg Oral BID AC  . potassium chloride  10 mEq Oral BID    Treatment Plan Summary: 1. Admit for crisis management and stabilization. 2. Medication management to reduce current symptoms to base line and improve the patient's overall level of functioning 3. Treat health problems as indicated. 4. Develop treatment plan to decrease risk of relapse upon discharge and the need for readmission. 5. Psycho-social education regarding relapse prevention and self care. 6. Health care follow up as needed for medical problems. 7. Restart home medications where appropriate.   Plan: 1.continue current plan of care with no changes at this time. Rona Ravens. Bruce Goodman PAC 07/01/2012, 11:03 PM

## 2012-07-01 NOTE — Progress Notes (Signed)
D) Pt attending the groups. Has a pleasant affect and rates his depression and hopelessness both at a 1. Denies SI and HI. States that he is feeling the best he has in a long time and is looking forward to going to live with his daughter. Has spoken with her several times. Requested tylenol for left armpit pain today. States one of his goals is to stop smoking pot. A)Given support, reassurance and praise. Also given encouragement. Praised for how well he has progressed and all the positive strides he has made. R) Denies SI and HI. Affect and mood are appropriate.

## 2012-07-01 NOTE — Progress Notes (Signed)
[facility] Group Notes:  (Counselor/Nursing/MHT/Case Management/Adjunct)  07/01/2012 1:26 AM  Type of Therapy:  Psychoeducational Skills  Participation Level:  Minimal  Participation Quality:  Appropriate  Affect:  Appropriate  Cognitive:  Appropriate  Insight:  Good  Engagement in Group:  Limited  Engagement in Therapy:  Limited  Modes of Intervention:  Education  Summary of Progress/Problems:The patient mentioned that he had a good day and that the groups were very helpful to him.  His goal for tomorrow is to get discharged and to attend more groups.    Tasheem Elms S 07/01/2012, 1:26 AM

## 2012-07-01 NOTE — Progress Notes (Signed)
Patient has been up and active on the unit, attended group this evening and has voiced no complaints. Patient happy and reports he is ready to be discharged and looking forward to living with his daughter. Patient reports that groups have been helpful. Patient currently denies having pain, -si/hi/a/v hall. Support and encouragement offered, safety maintained on unit, will continue to monitor.

## 2012-07-01 NOTE — Progress Notes (Signed)
[facility] Adult Inpatient Family/Significant Other Suicide Prevention Education  Suicide Prevention Education:  Education Completed; Kathryne Hitch (daughter) [phone] has been identified by the patient as the family member/significant other with whom the patient will be residing, and identified as the person(s) who will aid the patient in the event of a mental health crisis (suicidal ideations/suicide attempt).  With written consent from the patient, the family member/significant other has been provided the following suicide prevention education, prior to the and/or following the discharge of the patient.  The suicide prevention education provided includes the following:  Suicide risk factors  Suicide prevention and interventions  National Suicide Hotline telephone number  [facility] assessment telephone number  [facility] Emergency Assistance 911  [facility] and/or Residential Mobile Crisis Unit telephone number  Request made of family/significant other to:  Remove weapons (e.g., guns, rifles, knives), all items previously/currently identified as safety concern.    Remove drugs/medications (over-the-counter, prescriptions, illicit drugs), all items previously/currently identified as a safety concern.  The family member/significant other verbalizes understanding of the suicide prevention education information provided.  The family member/significant other agrees to remove the items of safety concern listed above.  Daughter stated that she has kicked out her boyfriend and wants the pt to come and live with her. [place] a [place] She has no safety concerns for the pt. Gevena Mart 07/01/2012, 4:05 PM

## 2012-07-01 NOTE — Progress Notes (Signed)
Patient ID: Bruce Goodman, male   DOB: 01/23/1956, 56 y.o.   MRN: [id number]  Pt. attended and participated in aftercare planning group. Pt.verbally accepted information on suicide prevention, warning signs to look for with suicide and crisis line numbers to use. Pt shared that he is feeling fine. No S/I or H/I.

## 2012-07-01 NOTE — Progress Notes (Signed)
Patient ID: Bruce Goodman, male   DOB: 17-Apr-1956, 56 y.o.   MRN: [id number]   [facility] Group Notes:  (Counselor/Nursing/MHT/Case Management/Adjunct)  07/01/2012 1:15 PM  Type of Therapy:  Group Therapy, Dance/Movement Therapy   Participation Level:  Minimal  Participation Quality:  Appropriate  Affect:  Appropriate  Cognitive:  Appropriate  Insight:  Limited  Engagement in Group:  Limited  Engagement in Therapy:  Limited  Modes of Intervention:  Clarification, Problem-solving, Role-play, Socialization and Support  Summary of Progress/Problems: Therapist and group members discussed healthy support systems and the importance of figuring out who we are and what we need. Group members shared experiences and ways they feel supported. Pt did not share much with group but was engaged and attentive.     Cassidi Long 07/01/2012. 3:07 PM

## 2012-07-01 NOTE — Progress Notes (Signed)
Psychoeducational Group Note  Date:  07/01/2012 Time:  1015  Group Topic/Focus:  Making Healthy Choices:   The focus of this group is to help patients identify negative/unhealthy choices they were using prior to admission and identify positive/healthier coping strategies to replace them upon discharge.  Participation Level:  Active  Participation Quality:  Appropriate  Affect:  Appropriate  Cognitive:  Appropriate  Insight:  Good  Engagement in Group:  Good  Additional Comments:  Paid attention to what was going on and was supportive to others, as well as others giving him support.  Dione Housekeeper 07/01/2012

## 2012-07-01 NOTE — Progress Notes (Signed)
Psychoeducational Group Note  Date:  07/01/2012 Time:  1515  Group Topic/Focus:  Conflict Resolution:   The focus of this group is to discuss the conflict resolution process and how it may be used upon discharge.  Participation Level:  Active  Participation Quality:  Appropriate, Sharing and Supportive  Affect:  Appropriate  Cognitive:  Appropriate  Insight:  Good  Engagement in Group:  Good  Additional Comments:  none  Shakila Mak M 07/01/2012, 7:50 PM

## 2012-07-02 DIAGNOSIS — F314 Bipolar disorder, current episode depressed, severe, without psychotic features: Secondary | ICD-10-CM | POA: Diagnosis not present

## 2012-07-02 MED ORDER — OMEGA-3-ACID ETHYL ESTERS 1 G PO CAPS: 1.0000 g | ORAL_CAPSULE | Freq: Two times a day (BID) | ORAL | Status: DC

## 2012-07-02 MED ORDER — PRAZOSIN HCL 1 MG PO CAPS: 1.0000 mg | ORAL_CAPSULE | Freq: Every evening | ORAL | Status: DC | PRN

## 2012-07-02 MED ORDER — MIRTAZAPINE 15 MG PO TABS: 7.5000 mg | ORAL_TABLET | Freq: Every day | ORAL | Status: DC

## 2012-07-02 MED ORDER — OMEPRAZOLE 20 MG PO CPDR: 20.0000 mg | DELAYED_RELEASE_CAPSULE | Freq: Every day | ORAL | Status: DC

## 2012-07-02 MED ORDER — OLANZAPINE 10 MG PO TABS
10.0000 mg | ORAL_TABLET | Freq: Every day | ORAL | Status: DC
  Filled 2012-07-02: qty 1

## 2012-07-02 MED ORDER — AMITRIPTYLINE HCL 25 MG PO TABS: 50.0000 mg | ORAL_TABLET | Freq: Every day | ORAL | Status: DC

## 2012-07-02 MED ORDER — OLANZAPINE 10 MG PO TABS: 10.0000 mg | ORAL_TABLET | Freq: Every day | ORAL | Status: DC

## 2012-07-02 MED ORDER — MELOXICAM 7.5 MG PO TABS: 7.5000 mg | ORAL_TABLET | Freq: Every day | ORAL | Status: DC

## 2012-07-02 MED ORDER — OLANZAPINE 2.5 MG PO TABS: 2.5000 mg | ORAL_TABLET | Freq: Two times a day (BID) | ORAL | Status: DC

## 2012-07-02 NOTE — Progress Notes (Signed)
Patient discharged home with ride on bus to [employer].  Discharge instructions, follow up appointments given.  Patient verbalized understanding.  Personal belongings returned from locker.

## 2012-07-02 NOTE — Progress Notes (Signed)
Group Note  Date:  07/02/2012 Time:  1:15  Group Topic/Focus:  Overcoming Obstacles to Wellness  Participation Level:  Minimal  Participation Quality:  Appropriate  Affect:  Appropriate  Cognitive:  Appropriate  Insight:  Good  Engagement in Group:  Limited  Additional Comments:  Bruce Goodman did not say much during group, but it was clear that he was paying attention and listening to other group members because he was responding with agreement and appeared alert. He left group early, likely to be discharged.  Chris Narasimhan S 07/02/2012, 2:50 PM

## 2012-07-02 NOTE — Progress Notes (Signed)
I agree with this note on this date. 

## 2012-07-02 NOTE — Tx Team (Signed)
Interdisciplinary Treatment Plan Update (Adult)  Date:  07/02/2012  Time Reviewed:  10:32 AM   Progress in Treatment: Attending groups:   Yes   Participating in groups:  Yes Taking medication as prescribed:  Yes Tolerating medication:  Yes Family/Significant othe contact made: Counselor to follow up on collateral contact Patient understands diagnosis:  Yes Discussing patient identified problems/goals with staff: Yes Medical problems stabilized or resolved: Yes Denies suicidal/homicidal ideation:Yes Issues/concerns per patient self-inventory:  Other:  New problem(s) identified:  Reason for Continuation of Hospitalization: Interventions implemented related to continuation of hospitalization:  Additional comments:  Estimated length of stay: Discharge home today  Discharge Plan:  Home with outpatient follow up  New goal(s):  Review of initial/current patient goals per problem list:    1.  Goal(s):  Eliminate SI/other thoughts of self harm   Met:  Yes  Target date: d/c  As evidenced by: Patient no longer endorsing SI/HI or other thoughts of self harm.    2.  Goal (s): Reduce depression/anxiety  (rates at zero today)   Met:  Yes  Target date: d/c  As evidenced by: Patient currently rating symptoms at four or below    3.  Goal(s): .stabilize on meds   Met:  Yes  Target date: d/c  As evidenced by: Patient reports being stabilized on medications - less symptomatic    4.  Goal(s):  Refer for outpatient follow up   Met:  Yes  Target date: d/c  As evidenced by: Follow up appointment scheduled    Attendees: Patient:  Bruce Goodman 07/02/2012 10:33 AM  Nursing:  Neill Loft, RN 07/02/2012 10:33 AM  Physician:  Orson Aloe, MD 07/02/2012 10:32 AM   Nursing:   Chinita Greenland, RN 07/02/2012 10:32 AM   CaseManager:  Juline Patch, LCSW 07/02/2012 10:32 AM   Counselor:  Angus Palms, LCSW 07/02/2012 10:32 AM   Other:  Kathlee Nations, RN      07/02/2012  10:34 AM

## 2012-07-02 NOTE — Progress Notes (Signed)
[facility] Case Management Discharge Plan:  Will you be returning to the same living situation after discharge:  At discharge, do you have transportation home?:Yes,  Patient to be transporated by [facility] Do you have the ability to pay for your medications:Yes,  Patient has Medicare/Medicaid  Interagency Information:     Release of information consent forms completed and in the chart;  Patient's signature needed at discharge.  Patient to Follow up at:  Follow-up Information    Follow up with [facility]. On 07/03/2012. (You are scheduled with [facility] on Tuesday, July 04, 2012 at 7:45 AM.  Referral # [id number])    Contact information:   [address] [place], [place]  [address]  [phone]         Patient denies SI/HI:   Yes,  Patient is no longer endorsing SI/HI or other thoughts of self harm    Safety Planning and Suicide Prevention discussed:  Yes,  Reviewed during after care group  Barrier to discharge identified:Yes,  Lack of permanent housing; limited support  Summary and Recommendations: Patient encouraged to be compliant with medications and follow up with outpatient recommendations    Bela Bonaparte, Joesph July 07/02/2012, 11:04 AM

## 2012-07-02 NOTE — Progress Notes (Signed)
[facility] Group Notes:  (Counselor/Nursing/MHT/Case Management/Adjunct)  07/02/2012 2:09 AM  Type of Therapy:  Psychoeducational Skills  Participation Level:  Active  Participation Quality:  Attentive  Affect:  Appropriate  Cognitive:  Appropriate  Insight:  Good  Engagement in Group:  Good  Engagement in Therapy:  Good  Modes of Intervention:  Education  Summary of Progress/Problems: The patient refused to attend the A.A. Meeting on the 300 hallway. He states that his morning wasn't very positive,but his afternoon was much improved since he talked more with his peers. His goal for tomorrow is to have a great day.    Bruce Goodman S 07/02/2012, 2:09 AM
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
## 2012-07-02 NOTE — H&P (Signed)
Medical/psychiatric screening examination/treatment/procedure(s) were performed by non-physician practitioner and as supervising physician I was immediately available for consultation/collaboration.  I have seen and examined this patient and agree with the major elements of this evaluation.  

## 2012-07-03 NOTE — Progress Notes (Signed)
Patient Discharge Instructions:  After Visit Summary (AVS):   Faxed to:  07/03/2012 Psychiatric Admission Assessment Note:   Faxed to:  07/03/2012 Suicide Risk Assessment - Discharge Assessment:   Faxed to:  07/03/2012 Faxed/Sent to the Next Level Care provider:  07/03/2012  Faxed to [facility] @ [phone]  Leyton Brownlee, Eduard Clos, 07/03/2012, 1:41 PM

## 2012-07-10 NOTE — Progress Notes (Signed)
I agree with this note on this date. 

## 2012-08-13 NOTE — Discharge Summary (Signed)
Physician Discharge Summary Note  Patient:  Bruce Goodman is an 56 y.o., male MRN:  [id number] DOB:  [birth year] Patient phone:  [phone] (home)  Patient address:   [address] [place] [address]   Date of Admission:  06/28/2012 Date of Discharge: 07/02/2012  Discharge Diagnoses: Active Problems:  Cannabis abuse  Generalized anxiety disorder  Axis Diagnosis:  Axis I: Bipolar affective disorder, depressed, severe, with psychotic behavior  Axis II: Deferred  Axis III:  Past Medical History   Diagnosis  Date   .  Bipolar 1 disorder    .  Depression    .  Schizophrenia    Axis IV: other psychosocial or environmental problems  Axis V: 41-50 serious symptoms   Level of Care:  OP]  Hospital Course:   This is a 56 year old caucasian male admitted to [facility] ED with reports of complaints of increased depression with suicidal ideation. Patient has a plan to jump in front of a moving truck. Patient reports "I feel depressed and I have been depressed for a long time. But I don't like to be around people. The reason for my depression started when I was young. My Dad was an alcoholic. I think he killed my Mom because of his drinking. That was what my aunt told me, at a time my mother had a nervous break down and had to go to the hospital for it. I had a rough childhood. I was in this hospital not to long ago. I was seeing a psychiatrist at [facility]. I was put on Zyprexa 15 mg at night, Remeron 15 mg at bed time, and Neurontin 300 mg. In the beginning my Zyprexa and Remeron will help me sleep, but they don't work no more".   While a patient in this hospital, Bruce Goodman was enrolled in group counseling and activities as well as received the following medication No current facility-administered medications for this encounter. Current outpatient prescriptions:gabapentin (NEURONTIN) 300 MG capsule, Take 1 capsule (300 mg total) by mouth 3 (three) times daily. For  anxiety, Disp: 90 capsule, Rfl: 0;  amitriptyline (ELAVIL) 25 MG tablet, Take 2 tablets (50 mg total) by mouth at bedtime. For pain management, depression, insomnia, and anxiety., Disp: 60 tablet, Rfl: 0 meloxicam (MOBIC) 7.5 MG tablet, Take 1 tablet (7.5 mg total) by mouth daily. For inflammation, Disp: 30 tablet, Rfl: 0;  mirtazapine (REMERON) 15 MG tablet, Take 0.5 tablets (7.5 mg total) by mouth at bedtime. One half works better for insomnia, Disp: 15 tablet, Rfl: 0;  OLANZapine (ZYPREXA) 10 MG tablet, Take 1 tablet (10 mg total) by mouth at bedtime. For mood control, anxiety, and insomnia, Disp: 30 tablet, Rfl: 0 OLANZapine (ZYPREXA) 2.5 MG tablet, Take 1 tablet (2.5 mg total) by mouth 2 (two) times daily before a meal. For anxiety, Disp: 60 tablet, Rfl: 0;  omega-3 acid ethyl esters (LOVAZA) 1 G capsule, Take 1 capsule (1 g total) by mouth 2 (two) times daily. For brain recovery from years of substance abuse, Disp: 60 capsule, Rfl: 0 omeprazole (PRILOSEC) 20 MG capsule, Take 1 capsule (20 mg total) by mouth daily. For control of stomach acid secretion and helps GERD., Disp: 30 capsule, Rfl: 0;  prazosin (MINIPRESS) 1 MG capsule, Take 1 capsule (1 mg total) by mouth at bedtime as needed and may repeat dose one time if needed (nightmares.)., Disp: 45 capsule, Rfl: 0  Patient attended treatment team meeting this am and met with treatment  team members. Pt symptoms, treatment plan and response to treatment discussed. Bruce Goodman endorsed that their symptoms have improved. Pt also stated that they are stable for discharge.  In other to control Active Problems:  Cannabis abuse  Generalized anxiety disorder , they will continue psychiatric care on outpatient basis. They will follow-up at  Follow-up Information    Follow up with [facility]. On 07/03/2012. (You are scheduled with [facility] on Tuesday, July 04, 2012 at 7:45 AM.  Referral # [id number])    Contact information:   [address] [place], [place]   [address]  [phone]       .  In addition they were instructed to take all your medications as prescribed by your mental healthcare provider, to report any adverse effects and or reactions from your medicines to your outpatient provider promptly, patient is instructed and cautioned to not engage in alcohol and or illegal drug use while on prescription medicines, in the event of worsening symptoms, patient is instructed to call the crisis hotline, 911 and or go to the nearest ED for appropriate evaluation and treatment of symptoms.   Upon discharge, patient adamantly denies suicidal, homicidal ideations, auditory, visual hallucinations and or delusional thinking. They left [facility] with all personal belongings in no apparent distress.  Consults:  See electronic record for details  Significant Diagnostic Studies:  See electronic record for details  Discharge Vitals:   Blood pressure 95/65, pulse 109, temperature 97.7 F (36.5 C), temperature source Oral, resp. rate 16, height 5\' 10"  (1.778 m), weight 170 lb (77.111 kg)..  Mental Status Exam: See Mental Status Examination and Suicide Risk Assessment completed by Attending Physician prior to discharge.  Discharge destination:  Home  Is patient on multiple antipsychotic therapies at discharge:  No  Has Patient had three or more failed trials of antipsychotic monotherapy by history: N/A Recommended Plan for Multiple Antipsychotic Therapies: N/A    Medication List     As of 08/13/2012 12:53 PM    STOP taking these medications         OLANZapine zydis 10 MG disintegrating tablet   Commonly known as: ZYPREXA      TAKE these medications      Indication    amitriptyline 25 MG tablet   Commonly known as: ELAVIL   Take 2 tablets (50 mg total) by mouth at bedtime. For pain management, depression, insomnia, and anxiety.       gabapentin 300 MG capsule   Commonly known as: NEURONTIN   Take 1 capsule (300 mg total) by mouth 3 (three) times  daily. For anxiety       meloxicam 7.5 MG tablet   Commonly known as: MOBIC   Take 1 tablet (7.5 mg total) by mouth daily. For inflammation       mirtazapine 15 MG tablet   Commonly known as: REMERON   Take 0.5 tablets (7.5 mg total) by mouth at bedtime. One half works better for insomnia       OLANZapine 2.5 MG tablet   Commonly known as: ZYPREXA   Take 1 tablet (2.5 mg total) by mouth 2 (two) times daily before a meal. For anxiety       OLANZapine 10 MG tablet   Commonly known as: ZYPREXA   Take 1 tablet (10 mg total) by mouth at bedtime. For mood control, anxiety, and insomnia       omega-3 acid ethyl esters 1 G capsule   Commonly known as: LOVAZA   Take  1 capsule (1 g total) by mouth 2 (two) times daily. For brain recovery from years of substance abuse       omeprazole 20 MG capsule   Commonly known as: PRILOSEC   Take 1 capsule (20 mg total) by mouth daily. For control of stomach acid secretion and helps GERD.       prazosin 1 MG capsule   Commonly known as: MINIPRESS   Take 1 capsule (1 mg total) by mouth at bedtime as needed and may repeat dose one time if needed (nightmares.).            Follow-up Information    Follow up with [facility]. On 07/03/2012. (You are scheduled with [facility] on Tuesday, July 04, 2012 at 7:45 AM.  Referral # [id number])    Contact information:   [address] [place], [place]  [address]  [phone]        Follow-up recommendations:   Activities: Resume typical activities Diet: Resume typical diet Tests: none Other: Follow up with outpatient provider and report any side effects to out patient prescriber.  Comments:  Take all your medications as prescribed by your mental healthcare provider. Report any adverse effects and or reactions from your medicines to your outpatient provider promptly. Patient is instructed and cautioned to not engage in alcohol and or illegal drug use while on prescription medicines. In the event of worsening symptoms,  patient is instructed to call the crisis hotline, 911 and or go to the nearest ED for appropriate evaluation and treatment of symptoms. Follow-up with your primary care provider for your other medical issues, concerns and or health care needs.  SignedOrson Aloe 08/13/2012 12:53 PM

## 2012-08-17 ENCOUNTER — Encounter (HOSPITAL_COMMUNITY): Payer: Self-pay | Admitting: *Deleted

## 2012-08-17 ENCOUNTER — Emergency Department (HOSPITAL_COMMUNITY)
Admission: EM | Admit: 2012-08-17 | Discharge: 2012-08-17 | Disposition: A | Discharge status: Other Acute Inpatient Hospital | Payer: Medicare Other | Attending: Emergency Medicine | Admitting: Emergency Medicine

## 2012-08-17 DIAGNOSIS — F329 Major depressive disorder, single episode, unspecified: Secondary | ICD-10-CM | POA: Diagnosis not present

## 2012-08-17 DIAGNOSIS — F3289 Other specified depressive episodes: Secondary | ICD-10-CM | POA: Insufficient documentation

## 2012-08-17 DIAGNOSIS — Z87891 Personal history of nicotine dependence: Secondary | ICD-10-CM | POA: Insufficient documentation

## 2012-08-17 DIAGNOSIS — F29 Unspecified psychosis not due to a substance or known physiological condition: Secondary | ICD-10-CM | POA: Diagnosis not present

## 2012-08-17 DIAGNOSIS — F489 Nonpsychotic mental disorder, unspecified: Secondary | ICD-10-CM | POA: Diagnosis not present

## 2012-08-17 DIAGNOSIS — F32A Depression, unspecified: Secondary | ICD-10-CM

## 2012-08-17 DIAGNOSIS — F1210 Cannabis abuse, uncomplicated: Secondary | ICD-10-CM | POA: Insufficient documentation

## 2012-08-17 DIAGNOSIS — F319 Bipolar disorder, unspecified: Secondary | ICD-10-CM | POA: Diagnosis not present

## 2012-08-17 DIAGNOSIS — Z79899 Other long term (current) drug therapy: Secondary | ICD-10-CM | POA: Insufficient documentation

## 2012-08-17 DIAGNOSIS — Z8659 Personal history of other mental and behavioral disorders: Secondary | ICD-10-CM | POA: Insufficient documentation

## 2012-08-17 LAB — URINALYSIS, ROUTINE W REFLEX MICROSCOPIC
Bilirubin Urine: NEGATIVE
Glucose, UA: NEGATIVE mg/dL
Hgb urine dipstick: NEGATIVE
Ketones, ur: NEGATIVE mg/dL
Leukocytes, UA: NEGATIVE
Nitrite: NEGATIVE
Protein, ur: NEGATIVE mg/dL
Specific Gravity, Urine: <1.005 — ABNORMAL LOW (ref 1.005–1.030)
Urobilinogen, UA: 1 mg/dL (ref 0.0–1.0)
pH: 6 (ref 5.0–8.0)

## 2012-08-17 LAB — BASIC METABOLIC PANEL
BUN: 8 mg/dL (ref 6–23)
CO2: 29 mEq/L (ref 19–32)
Calcium: 8.9 mg/dL (ref 8.4–10.5)
Chloride: 101 mEq/L (ref 96–112)
Creatinine, Ser: 0.85 mg/dL (ref 0.50–1.35)
GFR calc Af Amer: >90 mL/min (ref >90)
GFR calc non Af Amer: >90 mL/min (ref >90)
Glucose, Bld: 87 mg/dL (ref 70–99)
Potassium: 3.2 mEq/L — ABNORMAL LOW (ref 3.5–5.1)
Sodium: 139 mEq/L (ref 135–145)

## 2012-08-17 LAB — CBC WITH DIFFERENTIAL/PLATELET
Basophils Absolute: 0 10*3/uL (ref 0.0–0.1)
Basophils Relative: 0 % (ref 0–1)
Eosinophils Absolute: 0.1 10*3/uL (ref 0.0–0.7)
Eosinophils Relative: 3 % (ref 0–5)
HCT: 38.9 % — ABNORMAL LOW (ref 39.0–52.0)
Hemoglobin: 13.3 g/dL (ref 13.0–17.0)
Lymphocytes Relative: 38 % (ref 12–46)
Lymphs Abs: 1.8 10*3/uL (ref 0.7–4.0)
MCH: 31.8 pg (ref 26.0–34.0)
MCHC: 34.2 g/dL (ref 30.0–36.0)
MCV: 93.1 fL (ref 78.0–100.0)
Monocytes Absolute: 0.4 10*3/uL (ref 0.1–1.0)
Monocytes Relative: 7 % (ref 3–12)
Neutro Abs: 2.5 10*3/uL (ref 1.7–7.7)
Neutrophils Relative %: 52 % (ref 43–77)
Platelets: 207 10*3/uL (ref 150–400)
RBC: 4.18 MIL/uL — ABNORMAL LOW (ref 4.22–5.81)
RDW: 13.5 % (ref 11.5–15.5)
WBC: 4.8 10*3/uL (ref 4.0–10.5)

## 2012-08-17 LAB — ETHANOL: Alcohol, Ethyl (B): <11 mg/dL (ref 0–11)

## 2012-08-17 LAB — RAPID URINE DRUG SCREEN, HOSP PERFORMED
Amphetamines: NOT DETECTED
Barbiturates: NOT DETECTED
Benzodiazepines: NOT DETECTED
Cocaine: NOT DETECTED
Opiates: NOT DETECTED
Tetrahydrocannabinol: POSITIVE — AB

## 2012-08-17 NOTE — ED Provider Notes (Signed)
History   This chart was scribed for Bruce Hutching, MD by Toya Smothers, ED Scribe. The patient was seen in room APA15/APA15. Patient's care was started at 1412.  CSN: [id number]  Arrival date & time 08/17/12  1412   First MD Initiated Contact with Patient 08/17/12 1431      Chief Complaint  Patient presents with  . V70.1    Patient is a 56 y.o. male presenting with altered mental status. The history is provided by the patient. No language interpreter was used.  Altered Mental Status This is a recurrent problem. The current episode started yesterday. The problem occurs constantly. The problem has not changed since onset.Pertinent negatives include no chest pain, no abdominal pain, no headaches and no shortness of breath. He has tried nothing for the symptoms. The treatment provided no relief.    TYRONNE BLANN is a 56 y.o. male who presents to the Emergency Department complaining of several months of recurrent, gradually worsening stress and depression and 12 hours of left hand pain. Pt reports he "could get stress free so he picked up a burning log." Pt [place] living in the woods, and denotes that he does not want to live with any of his relatives. He has been taking his medications as needed, though he had had no improvement. No fever, chills, cough, congestion, rhinorrhea, chest pain, SOB, or n/v/d. Medical Hx includes Bipolar 1, Depression, and Schizophrenia. Pt reports that he is currently trying to quit smoking marijuana, but he is having difficulty.     Past Medical History  Diagnosis Date  . Bipolar 1 disorder   . Depression   . Schizophrenia     Past Surgical History  Procedure Date  . Wrist surgery     right    History reviewed. No pertinent family history.  History  Substance Use Topics  . Smoking status: Former Games developer  . Smokeless tobacco: Not on file  . Alcohol Use: No    Review of Systems  Respiratory: Negative for shortness of breath.     Cardiovascular: Negative for chest pain.  Gastrointestinal: Negative for abdominal pain.  Neurological: Negative for headaches.  Psychiatric/Behavioral: Positive for dysphoric mood and altered mental status.  All other systems reviewed and are negative.    Allergies  Haldol and Seroquel  Home Medications   Current Outpatient Rx  Name  Route  Sig  Dispense  Refill  . AMITRIPTYLINE HCL 25 MG PO TABS   Oral   Take 2 tablets (50 mg total) by mouth at bedtime. For pain management, depression, insomnia, and anxiety.   60 tablet   0   . GABAPENTIN 300 MG PO CAPS   Oral   Take 1 capsule (300 mg total) by mouth 3 (three) times daily. For anxiety   90 capsule   0   . MELOXICAM 7.5 MG PO TABS   Oral   Take 1 tablet (7.5 mg total) by mouth daily. For inflammation   30 tablet   0   . MIRTAZAPINE 15 MG PO TABS   Oral   Take 15 mg by mouth at bedtime. One half works better for insomnia         . OLANZAPINE 10 MG PO TABS   Oral   Take 1 tablet (10 mg total) by mouth at bedtime. For mood control, anxiety, and insomnia   30 tablet   0   . OLANZAPINE 2.5 MG PO TABS   Oral   Take 1 tablet (2.5  mg total) by mouth 2 (two) times daily before a meal. For anxiety   60 tablet   0   . OMEGA-3-ACID ETHYL ESTERS 1 G PO CAPS   Oral   Take 1 capsule (1 g total) by mouth 2 (two) times daily. 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 OMEPRAZOLE 20 MG PO CPDR   Oral   Take 1 capsule (20 mg total) by mouth daily. For control of stomach acid secretion and helps GERD.   30 capsule   0   . PRAZOSIN HCL 1 MG PO CAPS   Oral   Take 1 capsule (1 mg total) by mouth at bedtime as needed and may repeat dose one time if needed (nightmares.).   45 capsule   0     BP 134/80  Pulse 80  Temp 98 F (36.7 C) (Oral)  Resp 20  Ht 5\' 11"  (1.803 m)  Wt 172 lb (78.019 kg)  BMI 23.99 kg/m2  SpO2 99%  Physical Exam  Nursing note and vitals  reviewed. Constitutional: He is oriented to person, place, and time. He appears well-developed and well-nourished.  HENT:  Head: Normocephalic and atraumatic.  Eyes: Conjunctivae normal and EOM are normal. Pupils are equal, round, and reactive to light.  Neck: Normal range of motion. Neck supple.  Cardiovascular: Normal rate, regular rhythm and normal heart sounds.   Pulmonary/Chest: Effort normal and breath sounds normal.  Abdominal: Soft. Bowel sounds are normal.  Musculoskeletal: Normal range of motion.  Neurological: He is alert and oriented to person, place, and time.  Skin: Skin is warm and dry.  Psychiatric: He exhibits a depressed mood.       Flat affect    ED Course  Procedures DIAGNOSTIC STUDIES: Oxygen Saturation is 99% on room air, normal by my interpretation.    COORDINATION OF CARE: 15:03- Evaluated Pt. Pt is awake, alert, and without distress. Pt appears depressed.   Labs Reviewed  URINALYSIS, ROUTINE W REFLEX MICROSCOPIC - Abnormal; Notable for the following:    Specific Gravity, Urine <1.005 (*)     All other components within normal limits  URINE RAPID DRUG SCREEN (HOSP PERFORMED) - Abnormal; Notable for the following:    Tetrahydrocannabinol POSITIVE (*)     All other components within normal limits  CBC WITH DIFFERENTIAL - Abnormal; Notable for the following:    RBC 4.18 (*)     HCT 38.9 (*)     All other components within normal limits  BASIC METABOLIC PANEL - Abnormal; Notable for the following:    Potassium 3.2 (*)     All other components within normal limits  ETHANOL   No results found.   No diagnosis found.    MDM  Tele-psychiatry consult obtained. Psychiatrist states patient is suicidal with plan. Will discuss with behavioral health and seek admission     I personally performed the services described in this documentation, which was scribed in my presence. The recorded information has been reviewed and is accurate.   Bruce Hutching,  MD 08/17/12 [phone]

## 2012-08-17 NOTE — ED Notes (Signed)
Water given  

## 2012-08-17 NOTE — ED Notes (Signed)
Pt has been accepted at [facility] per [place], ACT

## 2012-08-17 NOTE — BH Assessment (Signed)
Assessment Note   Bruce Goodman is an 56 y.o. male. He arrive at the Emergency Department with thoughts of harming himself. He stated that he was so distraught that he had to burn himself to try to get the thoughts out of his head. He states that he wants to kill himself because he feels very depressed and can't stop feeling bad. He said he thinks about riding his bike into traffic and ending it all. He reports he is taking his medications but they are not working like they had been. He reports he isn't sleeping well and hasn't slept for several days. His thought processes shift from topic to topic, but he is pleasant and cooperative. He does not appear to be psychotic or delusional. He states he wishes he could do better than he does; but he always gets to this point. There is no history of violence reported. Patient will not contract for safety. After discussing with ED physician and getting a telepsych consult-which states he needs inpatient services, patient will be IVC'd and referred to [facility] and [facility] for possible admission.   Axis I: Bipolar, Depressed; Hx of schizophrenia Axis II: Deferred Axis III: Past hx of wrist surgery Axis IV: financial issues resulting in homelessness; poor social and community support Axis V: GAF 28  Past Medical History:  Past Medical History  Diagnosis Date  . Bipolar 1 disorder   . Depression   . Schizophrenia     Past Surgical History  Procedure Date  . Wrist surgery     right    Family History: History reviewed. No pertinent family history.  Social History:  reports that he has quit smoking. He does not have any smokeless tobacco history on file. He reports that he uses illicit drugs (Marijuana). He reports that he does not drink alcohol.  Additional Social History:     CIWA: CIWA-Ar BP: 134/80 mmHg Pulse Rate: 80  COWS:    Allergies:  Allergies  Allergen Reactions  . Haldol (Haloperidol Decanoate)     REACTION:  Alters mental status  . Seroquel (Quetiapine Fumerate)     REACTION: UNKNOWN REACTION    Home Medications:  (Not in a hospital admission)  OB/GYN Status:  No LMP for male patient.  General Assessment Data Location of Assessment: [facility] Assessment: Yes Living Arrangements: Alone Can pt return to current living arrangement?: Yes Admission Status: Involuntary Is patient capable of signing voluntary admission?: Yes Transfer from: Acute Hospital Referral Source: MD     Risk to self Suicidal Ideation: Yes-Currently Present Suicidal Intent: Yes-Currently Present Is patient at risk for suicide?: Yes Suicidal Plan?: Yes-Currently Present Specify Current Suicidal Plan: Ride his bike into traffic Access to Means: Yes Specify Access to Suicidal Means: bike and traffic What has been your use of drugs/alcohol within the last 12 months?: thc Previous Attempts/Gestures: Yes How many times?: 4  Other Self Harm Risks: burned his hand Triggers for Past Attempts: Other personal contacts Intentional Self Injurious Behavior: Burning Comment - Self Injurious Behavior: says he was trying to get the bad thoughts out his h ead Family Suicide History: No Recent stressful life event(s): Financial Problems Persecutory voices/beliefs?: No Depression: Yes Depression Symptoms: Despondent;Isolating;Loss of interest in usual pleasures;Feeling worthless/self pity Substance abuse history and/or treatment for substance abuse?: Yes Suicide prevention information given to non-admitted patients: Not applicable  Risk to Others Homicidal Ideation: No Thoughts of Harm to Others: No Current Homicidal Intent: No Current Homicidal Plan: No Access  to Homicidal Means: No History of harm to others?: No Assessment of Violence: None Noted Does patient have access to weapons?: No Criminal Charges Pending?: No Does patient have a court date: No  Psychosis Hallucinations: None noted Delusions: None  noted  Mental Status Report Appear/Hygiene: Disheveled Eye Contact: Fair Motor Activity: Freedom of movement Speech: Logical/coherent Level of Consciousness: Alert Mood: Depressed Affect: Appropriate to circumstance Anxiety Level: Minimal Thought Processes: Circumstantial Judgement: Impaired Orientation: Person;Place;Time;Situation Obsessive Compulsive Thoughts/Behaviors: Minimal  Cognitive Functioning Concentration: Decreased Memory: Recent Intact;Remote Intact IQ: Average Insight: Poor Impulse Control: Poor Appetite: Fair Total Hours of Sleep:  (2 hr) Vegetative Symptoms: None  ADLScreening [facility] Assessment Services) Patient's cognitive ability adequate to safely complete daily activities?: Yes Patient able to express need for assistance with ADLs?: Yes Independently performs ADLs?: Yes (appropriate for developmental age)  Abuse/Neglect [facility]) Physical Abuse: Denies;Yes, past (Comment) Verbal Abuse: Denies Sexual Abuse: Denies  Prior Inpatient Therapy Prior Inpatient Therapy: Yes Prior Therapy Dates:  (Sept 2013) Prior Therapy Facilty/Provider(s): [facility], [facility] Reason for Treatment: Depression  Prior Outpatient Therapy Prior Outpatient Therapy: Yes Prior Therapy Dates: current Prior Therapy Facilty/Provider(s): [facility] Reason for Treatment: depression  ADL Screening (condition at time of admission) Patient's cognitive ability adequate to safely complete daily activities?: Yes Patient able to express need for assistance with ADLs?: Yes Independently performs ADLs?: Yes (appropriate for developmental age)       Abuse/Neglect Assessment (Assessment to be complete while patient is alone) Physical Abuse: Denies;Yes, past (Comment) Verbal Abuse: Denies Sexual Abuse: Denies Values / Beliefs Cultural Requests During Hospitalization: None Spiritual Requests During Hospitalization: None        Additional Information 1:1 In Past 12 Months?: No CIRT Risk:  No Elopement Risk: No Does patient have medical clearance?: Yes     Disposition:  Disposition Disposition of Patient: Inpatient treatment program Type of inpatient treatment program: Adult  Patient is being referred to [facility] and [facility]  On Site Evaluation by:  Dr. Donnetta Hutching Reviewed with Physician:  Dr. Alger Simons, Maximiano Coss H 08/17/2012 8:57 PM

## 2012-08-17 NOTE — ED Notes (Signed)
SI by riding bike into oncoming traffic.

## 2012-08-17 NOTE — ED Notes (Signed)
Telepsych in progress. 

## 2012-08-17 NOTE — ED Notes (Addendum)
[facility] [phone] report given to Elenor Legato, RN

## 2012-08-17 NOTE — ED Notes (Addendum)
Pt says he is depressed, has been taking xanax "off the street" Burned his fingers  Lt ring middle and little fingers yesterday "picked up a hot log" to help with his depression. Says he lives in a tent

## 2012-08-18 DIAGNOSIS — F332 Major depressive disorder, recurrent severe without psychotic features: Secondary | ICD-10-CM | POA: Diagnosis not present

## 2012-08-19 DIAGNOSIS — F332 Major depressive disorder, recurrent severe without psychotic features: Secondary | ICD-10-CM | POA: Diagnosis not present

## 2012-08-22 DIAGNOSIS — F332 Major depressive disorder, recurrent severe without psychotic features: Secondary | ICD-10-CM | POA: Diagnosis not present

## 2012-08-24 DIAGNOSIS — F332 Major depressive disorder, recurrent severe without psychotic features: Secondary | ICD-10-CM | POA: Diagnosis not present

## 2012-09-11 DIAGNOSIS — F3162 Bipolar disorder, current episode mixed, moderate: Secondary | ICD-10-CM | POA: Diagnosis not present

## 2012-09-11 DIAGNOSIS — F331 Major depressive disorder, recurrent, moderate: Secondary | ICD-10-CM | POA: Diagnosis not present

## 2012-09-23 ENCOUNTER — Encounter (HOSPITAL_COMMUNITY): Payer: Self-pay | Admitting: Emergency Medicine

## 2012-09-23 ENCOUNTER — Emergency Department (HOSPITAL_COMMUNITY)
Admission: EM | Admit: 2012-09-23 | Discharge: 2012-09-24 | Disposition: A | Discharge status: Other Acute Inpatient Hospital | Payer: Medicare Other | Attending: Emergency Medicine | Admitting: Emergency Medicine

## 2012-09-23 DIAGNOSIS — Z87891 Personal history of nicotine dependence: Secondary | ICD-10-CM | POA: Insufficient documentation

## 2012-09-23 DIAGNOSIS — F329 Major depressive disorder, single episode, unspecified: Secondary | ICD-10-CM | POA: Insufficient documentation

## 2012-09-23 DIAGNOSIS — F319 Bipolar disorder, unspecified: Secondary | ICD-10-CM | POA: Insufficient documentation

## 2012-09-23 DIAGNOSIS — R45851 Suicidal ideations: Secondary | ICD-10-CM | POA: Diagnosis not present

## 2012-09-23 DIAGNOSIS — F3289 Other specified depressive episodes: Secondary | ICD-10-CM | POA: Insufficient documentation

## 2012-09-23 DIAGNOSIS — F32A Depression, unspecified: Secondary | ICD-10-CM

## 2012-09-23 DIAGNOSIS — F209 Schizophrenia, unspecified: Secondary | ICD-10-CM | POA: Insufficient documentation

## 2012-09-23 DIAGNOSIS — Z79899 Other long term (current) drug therapy: Secondary | ICD-10-CM | POA: Insufficient documentation

## 2012-09-23 DIAGNOSIS — F1210 Cannabis abuse, uncomplicated: Secondary | ICD-10-CM | POA: Insufficient documentation

## 2012-09-23 DIAGNOSIS — F489 Nonpsychotic mental disorder, unspecified: Secondary | ICD-10-CM | POA: Diagnosis not present

## 2012-09-23 DIAGNOSIS — F29 Unspecified psychosis not due to a substance or known physiological condition: Secondary | ICD-10-CM | POA: Diagnosis not present

## 2012-09-23 LAB — CBC WITH DIFFERENTIAL/PLATELET
Basophils Absolute: 0.1 10*3/uL (ref 0.0–0.1)
Basophils Relative: 1 % (ref 0–1)
Eosinophils Absolute: 0.1 10*3/uL (ref 0.0–0.7)
Eosinophils Relative: 2 % (ref 0–5)
HCT: 43.2 % (ref 39.0–52.0)
Hemoglobin: 14.8 g/dL (ref 13.0–17.0)
Lymphocytes Relative: 34 % (ref 12–46)
Lymphs Abs: 2.1 10*3/uL (ref 0.7–4.0)
MCH: 31.8 pg (ref 26.0–34.0)
MCHC: 34.3 g/dL (ref 30.0–36.0)
MCV: 92.7 fL (ref 78.0–100.0)
Monocytes Absolute: 0.4 10*3/uL (ref 0.1–1.0)
Monocytes Relative: 6 % (ref 3–12)
Neutro Abs: 3.6 10*3/uL (ref 1.7–7.7)
Neutrophils Relative %: 58 % (ref 43–77)
Platelets: 216 10*3/uL (ref 150–400)
RBC: 4.66 MIL/uL (ref 4.22–5.81)
RDW: 13 % (ref 11.5–15.5)
WBC: 6.2 10*3/uL (ref 4.0–10.5)

## 2012-09-23 LAB — COMPREHENSIVE METABOLIC PANEL
ALT: 10 U/L (ref 0–53)
AST: 13 U/L (ref 0–37)
Albumin: 4.2 g/dL (ref 3.5–5.2)
Alkaline Phosphatase: 61 U/L (ref 39–117)
BUN: 9 mg/dL (ref 6–23)
CO2: 26 mEq/L (ref 19–32)
Calcium: 9.4 mg/dL (ref 8.4–10.5)
Chloride: 101 mEq/L (ref 96–112)
Creatinine, Ser: 0.85 mg/dL (ref 0.50–1.35)
GFR calc Af Amer: >90 mL/min (ref >90)
GFR calc non Af Amer: >90 mL/min (ref >90)
Glucose, Bld: 100 mg/dL — ABNORMAL HIGH (ref 70–99)
Potassium: 3.3 mEq/L — ABNORMAL LOW (ref 3.5–5.1)
Sodium: 137 mEq/L (ref 135–145)
Total Bilirubin: 0.9 mg/dL (ref 0.3–1.2)
Total Protein: 7.5 g/dL (ref 6.0–8.3)

## 2012-09-23 LAB — RAPID URINE DRUG SCREEN, HOSP PERFORMED
Amphetamines: NOT DETECTED
Barbiturates: NOT DETECTED
Benzodiazepines: NOT DETECTED
Cocaine: NOT DETECTED
Opiates: NOT DETECTED
Tetrahydrocannabinol: POSITIVE — AB

## 2012-09-23 LAB — ETHANOL: Alcohol, Ethyl (B): <11 mg/dL (ref 0–11)

## 2012-09-23 MED ORDER — LORAZEPAM 1 MG PO TABS
1.0000 mg | ORAL_TABLET | Freq: Three times a day (TID) | ORAL | Status: DC | PRN
  Administered 2012-09-23: 1 mg via ORAL
  Filled 2012-09-23: qty 1

## 2012-09-23 MED ORDER — POTASSIUM CHLORIDE 20 MEQ/15ML (10%) PO LIQD
40.0000 meq | Freq: Once | ORAL | Status: AC
  Administered 2012-09-23: 40 meq via ORAL
  Filled 2012-09-23: qty 30

## 2012-09-23 MED ORDER — METHOCARBAMOL 500 MG PO TABS
500.0000 mg | ORAL_TABLET | Freq: Two times a day (BID) | ORAL | Status: DC
  Administered 2012-09-23 – 2012-09-24 (×2): 500 mg via ORAL
  Filled 2012-09-23 (×2): qty 1

## 2012-09-23 MED ORDER — GABAPENTIN 300 MG PO CAPS
ORAL_CAPSULE | ORAL | Status: AC
  Filled 2012-09-23: qty 1

## 2012-09-23 MED ORDER — IBUPROFEN 400 MG PO TABS: 400.0000 mg | ORAL_TABLET | Freq: Three times a day (TID) | ORAL | Status: DC | PRN

## 2012-09-23 MED ORDER — OLANZAPINE 5 MG PO TABS
10.0000 mg | ORAL_TABLET | Freq: Every day | ORAL | Status: DC
  Administered 2012-09-23: 10 mg via ORAL
  Filled 2012-09-23: qty 2
  Filled 2012-09-23 (×2): qty 1

## 2012-09-23 MED ORDER — OLANZAPINE 5 MG PO TABS
ORAL_TABLET | ORAL | Status: AC
  Filled 2012-09-23: qty 2

## 2012-09-23 MED ORDER — GABAPENTIN 600 MG PO TABS
300.0000 mg | ORAL_TABLET | Freq: Three times a day (TID) | ORAL | Status: DC
  Administered 2012-09-23: 300 mg via ORAL
  Filled 2012-09-23 (×5): qty 0.5

## 2012-09-23 MED ORDER — MIRTAZAPINE 7.5 MG PO TABS
7.5000 mg | ORAL_TABLET | Freq: Every day | ORAL | Status: DC
  Administered 2012-09-23: 7.5 mg via ORAL
  Filled 2012-09-23 (×2): qty 1

## 2012-09-23 MED ORDER — NICOTINE 21 MG/24HR TD PT24: 21.0000 mg | MEDICATED_PATCH | Freq: Every day | TRANSDERMAL | Status: DC | PRN

## 2012-09-23 MED ORDER — ALUM & MAG HYDROXIDE-SIMETH 200-200-20 MG/5ML PO SUSP: 30.0000 mL | ORAL | Status: DC | PRN

## 2012-09-23 MED ORDER — ZOLPIDEM TARTRATE 5 MG PO TABS
5.0000 mg | ORAL_TABLET | Freq: Every evening | ORAL | Status: DC | PRN
  Administered 2012-09-23: 5 mg via ORAL
  Filled 2012-09-23: qty 1

## 2012-09-23 MED ORDER — ONDANSETRON HCL 4 MG PO TABS
4.0000 mg | ORAL_TABLET | Freq: Three times a day (TID) | ORAL | Status: DC | PRN
  Administered 2012-09-24: 4 mg via ORAL
  Filled 2012-09-23: qty 1

## 2012-09-23 MED ORDER — ACETAMINOPHEN 325 MG PO TABS: 650.0000 mg | ORAL_TABLET | ORAL | Status: DC | PRN

## 2012-09-23 NOTE — ED Notes (Signed)
AC notified to obtain scheduled meds from pharmacy

## 2012-09-23 NOTE — ED Notes (Signed)
Patient requested sleeping meds early because it takes a long time for him to fall asleep.  Given prn meds (Ativan and Ambien)  Patient took the meds then asked where his routine medications are: Neurontin, Zyprexa, Robaxin, Remeron.)  Advised I would speak to the MD and she would review his routine meds.

## 2012-09-23 NOTE — ED Notes (Addendum)
Pt threatening to harm self-states he would walk into traffic. Pt lives with daughter and daughter's boyfriend.  Pt states he has been increasingly depressed x 1 week due to daughter and her boyfriend fighting and cursing. Denies HI. Nad noted.

## 2012-09-23 NOTE — ED Provider Notes (Signed)
History     CSN: [id number]  Arrival date & time 09/23/12  1553   First MD Initiated Contact with Patient 09/23/12 1629      Chief Complaint  Patient presents with  . Medical Clearance     HPI Pt was seen at 1650.   Per pt, c/o gradual onset and worsening of persistent depression and SI for the past week.  States he plans on "walking out into traffic to kill myself."  Recently discharged from [facility] for same.  States he has been occasionally "living in a tent" instead of his daughter and her boyfriend because "they fight and curse a lot."  Denies SA, no HI.     Past Medical History  Diagnosis Date  . Bipolar 1 disorder   . Depression   . Schizophrenia     Past Surgical History  Procedure Date  . Wrist surgery     right    History  Substance Use Topics  . Smoking status: Former Games developer  . Smokeless tobacco: Not on file  . Alcohol Use: No     Review of Systems ROS: Statement: All systems negative except as marked or noted in the HPI; Constitutional: Negative for fever and chills. ; ; Eyes: Negative for eye pain, redness and discharge. ; ; ENMT: Negative for ear pain, hoarseness, nasal congestion, sinus pressure and sore throat. ; ; Cardiovascular: Negative for chest pain, palpitations, diaphoresis, dyspnea and peripheral edema. ; ; Respiratory: Negative for cough, wheezing and stridor. ; ; Gastrointestinal: Negative for nausea, vomiting, diarrhea, abdominal pain, blood in stool, hematemesis, jaundice and rectal bleeding. . ; ; Genitourinary: Negative for dysuria, flank pain and hematuria. ; ; Musculoskeletal: Negative for back pain and neck pain. Negative for swelling and trauma.; ; Skin: Negative for pruritus, rash, abrasions, blisters, bruising and skin lesion.; ; Neuro: Negative for headache, lightheadedness and neck stiffness. Negative for weakness, altered level of consciousness , altered mental status, extremity weakness, paresthesias, involuntary movement, seizure and  syncope.; Psych:  +depression, +SI, no SA, no HI, no hallucinations.       Allergies  Haldol and Seroquel  Home Medications   Current Outpatient Rx  Name  Route  Sig  Dispense  Refill  . GABAPENTIN 600 MG PO TABS   Oral   Take 600 mg by mouth 3 (three) times daily.         Marland Kitchen METHOCARBAMOL 500 MG PO TABS   Oral   Take 500 mg by mouth 2 (two) times daily.         Marland Kitchen MIRTAZAPINE 15 MG PO TABS   Oral   Take 15 mg by mouth at bedtime.          Marland Kitchen OLANZAPINE 15 MG PO TABS   Oral   Take 30 mg by mouth at bedtime.           BP 129/87  Pulse 92  Temp 98.6 F (37 C)  Resp 18  Ht 6' (1.829 m)  Wt 170 lb (77.111 kg)  BMI 23.06 kg/m2  SpO2 98%  Physical Exam 1655: Physical examination:  Nursing notes reviewed; Vital signs and O2 SAT reviewed;  Constitutional: Well developed, Well nourished, Well hydrated, In no acute distress; Head:  Normocephalic, atraumatic; Eyes: EOMI, PERRL, No scleral icterus; ENMT: Mouth and pharynx normal, Mucous membranes moist; Neck: Supple, Full range of motion, No lymphadenopathy; Cardiovascular: Regular rate and rhythm, No gallop; Respiratory: Breath sounds clear & equal bilaterally, No rales, rhonchi, wheezes.  Speaking full  sentences with ease, Normal respiratory effort/excursion; Chest: Nontender, Movement normal; Abdomen: Soft, Nontender, Nondistended, Normal bowel sounds;; Extremities: Pulses normal, No tenderness, No edema, No calf edema or asymmetry.; Neuro: AA&Ox3, Major CN grossly intact.  Speech clear. No gross focal motor or sensory deficits in extremities.; Skin: Color normal, Warm, Dry.; Psych:  Affect flat, poor eye contact. +depression, SI.    ED Course  Procedures     MDM  MDM Reviewed: previous chart, nursing note and vitals Interpretation: labs   Results for orders placed during the hospital encounter of 09/23/12  CBC WITH DIFFERENTIAL      Component Value Range   WBC 6.2  4.0 - 10.5 K/uL   RBC 4.66  4.22 - 5.81  MIL/uL   Hemoglobin 14.8  13.0 - 17.0 g/dL   HCT 16.1  09.6 - 04.5 %   MCV 92.7  78.0 - 100.0 fL   MCH 31.8  26.0 - 34.0 pg   MCHC 34.3  30.0 - 36.0 g/dL   RDW 40.9  81.1 - 91.4 %   Platelets 216  150 - 400 K/uL   Neutrophils Relative 58  43 - 77 %   Neutro Abs 3.6  1.7 - 7.7 K/uL   Lymphocytes Relative 34  12 - 46 %   Lymphs Abs 2.1  0.7 - 4.0 K/uL   Monocytes Relative 6  3 - 12 %   Monocytes Absolute 0.4  0.1 - 1.0 K/uL   Eosinophils Relative 2  0 - 5 %   Eosinophils Absolute 0.1  0.0 - 0.7 K/uL   Basophils Relative 1  0 - 1 %   Basophils Absolute 0.1  0.0 - 0.1 K/uL  COMPREHENSIVE METABOLIC PANEL      Component Value Range   Sodium 137  135 - 145 mEq/L   Potassium 3.3 (*) 3.5 - 5.1 mEq/L   Chloride 101  96 - 112 mEq/L   CO2 26  19 - 32 mEq/L   Glucose, Bld 100 (*) 70 - 99 mg/dL   BUN 9  6 - 23 mg/dL   Creatinine, Ser 7.82  0.50 - 1.35 mg/dL   Calcium 9.4  8.4 - 95.6 mg/dL   Total Protein 7.5  6.0 - 8.3 g/dL   Albumin 4.2  3.5 - 5.2 g/dL   AST 13  0 - 37 U/L   ALT 10  0 - 53 U/L   Alkaline Phosphatase 61  39 - 117 U/L   Total Bilirubin 0.9  0.3 - 1.2 mg/dL   GFR calc non Af Amer >90  >90 mL/min   GFR calc Af Amer >90  >90 mL/min  URINE RAPID DRUG SCREEN (HOSP PERFORMED)      Component Value Range   Opiates NONE DETECTED  NONE DETECTED   Cocaine NONE DETECTED  NONE DETECTED   Benzodiazepines NONE DETECTED  NONE DETECTED   Amphetamines NONE DETECTED  NONE DETECTED   Tetrahydrocannabinol POSITIVE (*) NONE DETECTED   Barbiturates NONE DETECTED  NONE DETECTED  ETHANOL      Component Value Range   Alcohol, Ethyl (B) <11  0 - 11 mg/dL     2130:  Telepsych MD has eval, recommends admission.  ACT team aware, no beds available this evening.  Holding orders written.           Laray Anger, DO 09/23/12 2318

## 2012-09-24 MED ORDER — OLANZAPINE 5 MG PO TABS
30.0000 mg | ORAL_TABLET | Freq: Every day | ORAL | Status: DC
  Filled 2012-09-24: qty 6
  Filled 2012-09-24: qty 3

## 2012-09-24 MED ORDER — GABAPENTIN 300 MG PO CAPS
300.0000 mg | ORAL_CAPSULE | Freq: Three times a day (TID) | ORAL | Status: DC
  Administered 2012-09-24 (×2): 300 mg via ORAL
  Filled 2012-09-24 (×7): qty 1

## 2012-09-24 MED ORDER — MIRTAZAPINE 15 MG PO TABS
15.0000 mg | ORAL_TABLET | Freq: Every day | ORAL | Status: DC
  Filled 2012-09-24 (×3): qty 1

## 2012-09-24 NOTE — [facility] Counselor (Signed)
THE PATIENT HAS BEEN ACCEPTED TO [facility] BY DR Forrestine Him AS A VOLUNTARY ADMISSION. HE WILL BE TRANSPORTED BY [facility]. DR KNAPP ADVISED TO THE PENDING TRANSFER. [facility] INFORMED THAT THE PATIENT HAS BEEN ACCEPTED AND TO REMOVE HIM FROM THE PENDING LIST AT [facility].

## 2012-09-24 NOTE — ED Notes (Signed)
Patient escorted to restroom.

## 2012-09-24 NOTE — ED Notes (Signed)
Called [facility] again to give report. Left message with receptionist, stated would get the nurse to call me back. Pt updated on delay.

## 2012-09-24 NOTE — ED Provider Notes (Signed)
The patient is resting this morning. He is awaiting psychiatric placement  Filed Vitals:   09/24/12 0543  BP: 105/68  Pulse: 72  Temp: 97.5 F (36.4 C)  Resp: 18     Celene Kras, MD 09/24/12 [phone]

## 2012-09-24 NOTE — ED Provider Notes (Signed)
Patient seen and evaluated by the psychiatrist Dr. Rob Bunting, MD who recommends inpatient psychiatric treatment.  He recommended Zyprexa 30 mg each bedtime in room rhonchi 15 mg each bedtime.  Diagnosis is bipolar disorder.  Please see consultation note for complete details.  Lyanne Co, MD 09/24/12 0430

## 2012-09-24 NOTE — ED Notes (Signed)
Pt informed that [facility] will be here within the hour.

## 2012-09-24 NOTE — ED Notes (Signed)
Attempted to contact [facility] and give report. Left voicemail stating to call back.

## 2012-09-24 NOTE — ED Notes (Signed)
I called [facility] for transport to [facility].   May have a truck available within the hour.  Nurse informed.
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
## 2012-09-25 DIAGNOSIS — F3189 Other bipolar disorder: Secondary | ICD-10-CM | POA: Diagnosis not present

## 2012-09-26 DIAGNOSIS — F3189 Other bipolar disorder: Secondary | ICD-10-CM | POA: Diagnosis not present

## 2012-09-27 DIAGNOSIS — F3189 Other bipolar disorder: Secondary | ICD-10-CM | POA: Diagnosis not present

## 2012-09-28 DIAGNOSIS — F3189 Other bipolar disorder: Secondary | ICD-10-CM | POA: Diagnosis not present

## 2012-09-29 DIAGNOSIS — F3189 Other bipolar disorder: Secondary | ICD-10-CM | POA: Diagnosis not present

## 2012-09-30 DIAGNOSIS — F3189 Other bipolar disorder: Secondary | ICD-10-CM | POA: Diagnosis not present

## 2012-10-01 DIAGNOSIS — F3189 Other bipolar disorder: Secondary | ICD-10-CM | POA: Diagnosis not present

## 2012-10-02 DIAGNOSIS — F3189 Other bipolar disorder: Secondary | ICD-10-CM | POA: Diagnosis not present

## 2012-10-18 ENCOUNTER — Encounter (HOSPITAL_COMMUNITY): Payer: Self-pay | Admitting: *Deleted

## 2012-10-18 ENCOUNTER — Emergency Department (HOSPITAL_COMMUNITY)
Admission: EM | Admit: 2012-10-18 | Discharge: 2012-10-19 | Disposition: A | Discharge status: Other Acute Inpatient Hospital | Payer: Medicare Other | Source: Home / Self Care | Attending: Emergency Medicine | Admitting: Emergency Medicine

## 2012-10-18 DIAGNOSIS — F3289 Other specified depressive episodes: Secondary | ICD-10-CM | POA: Insufficient documentation

## 2012-10-18 DIAGNOSIS — F1290 Cannabis use, unspecified, uncomplicated: Secondary | ICD-10-CM

## 2012-10-18 DIAGNOSIS — R45851 Suicidal ideations: Secondary | ICD-10-CM

## 2012-10-18 DIAGNOSIS — F209 Schizophrenia, unspecified: Secondary | ICD-10-CM | POA: Insufficient documentation

## 2012-10-18 DIAGNOSIS — F309 Manic episode, unspecified: Secondary | ICD-10-CM | POA: Insufficient documentation

## 2012-10-18 DIAGNOSIS — Z79899 Other long term (current) drug therapy: Secondary | ICD-10-CM | POA: Insufficient documentation

## 2012-10-18 DIAGNOSIS — Z0000 Encounter for general adult medical examination without abnormal findings: Secondary | ICD-10-CM | POA: Insufficient documentation

## 2012-10-18 DIAGNOSIS — F329 Major depressive disorder, single episode, unspecified: Secondary | ICD-10-CM | POA: Insufficient documentation

## 2012-10-18 DIAGNOSIS — E876 Hypokalemia: Secondary | ICD-10-CM

## 2012-10-18 DIAGNOSIS — F411 Generalized anxiety disorder: Secondary | ICD-10-CM | POA: Insufficient documentation

## 2012-10-18 DIAGNOSIS — Z87891 Personal history of nicotine dependence: Secondary | ICD-10-CM | POA: Insufficient documentation

## 2012-10-18 LAB — COMPREHENSIVE METABOLIC PANEL
ALT: 8 U/L (ref 0–53)
AST: 13 U/L (ref 0–37)
Albumin: 3.6 g/dL (ref 3.5–5.2)
Alkaline Phosphatase: 57 U/L (ref 39–117)
BUN: 14 mg/dL (ref 6–23)
CO2: 36 mEq/L — ABNORMAL HIGH (ref 19–32)
Calcium: 9.4 mg/dL (ref 8.4–10.5)
Chloride: 98 mEq/L (ref 96–112)
Creatinine, Ser: 1.12 mg/dL (ref 0.50–1.35)
GFR calc Af Amer: 83 mL/min — ABNORMAL LOW (ref >90)
GFR calc non Af Amer: 72 mL/min — ABNORMAL LOW (ref >90)
Glucose, Bld: 102 mg/dL — ABNORMAL HIGH (ref 70–99)
Potassium: 3.1 mEq/L — ABNORMAL LOW (ref 3.5–5.1)
Sodium: 139 mEq/L (ref 135–145)
Total Bilirubin: 0.5 mg/dL (ref 0.3–1.2)
Total Protein: 6.9 g/dL (ref 6.0–8.3)

## 2012-10-18 LAB — CBC WITH DIFFERENTIAL/PLATELET
Basophils Absolute: 0 10*3/uL (ref 0.0–0.1)
Basophils Relative: 1 % (ref 0–1)
Eosinophils Absolute: 0.3 10*3/uL (ref 0.0–0.7)
Eosinophils Relative: 3 % (ref 0–5)
HCT: 41.4 % (ref 39.0–52.0)
Hemoglobin: 14.1 g/dL (ref 13.0–17.0)
Lymphocytes Relative: 30 % (ref 12–46)
Lymphs Abs: 2.2 10*3/uL (ref 0.7–4.0)
MCH: 31.6 pg (ref 26.0–34.0)
MCHC: 34.1 g/dL (ref 30.0–36.0)
MCV: 92.8 fL (ref 78.0–100.0)
Monocytes Absolute: 0.4 10*3/uL (ref 0.1–1.0)
Monocytes Relative: 6 % (ref 3–12)
Neutro Abs: 4.4 10*3/uL (ref 1.7–7.7)
Neutrophils Relative %: 60 % (ref 43–77)
Platelets: 247 10*3/uL (ref 150–400)
RBC: 4.46 MIL/uL (ref 4.22–5.81)
RDW: 12.7 % (ref 11.5–15.5)
WBC: 7.3 10*3/uL (ref 4.0–10.5)

## 2012-10-18 LAB — URINALYSIS, ROUTINE W REFLEX MICROSCOPIC
Bilirubin Urine: NEGATIVE
Glucose, UA: NEGATIVE mg/dL
Hgb urine dipstick: NEGATIVE
Leukocytes, UA: NEGATIVE
Nitrite: NEGATIVE
Protein, ur: NEGATIVE mg/dL
Specific Gravity, Urine: 1.015 (ref 1.005–1.030)
Urobilinogen, UA: 2 mg/dL — ABNORMAL HIGH (ref 0.0–1.0)
pH: 7.5 (ref 5.0–8.0)

## 2012-10-18 LAB — ETHANOL: Alcohol, Ethyl (B): <11 mg/dL (ref 0–11)

## 2012-10-18 LAB — RAPID URINE DRUG SCREEN, HOSP PERFORMED
Amphetamines: NOT DETECTED
Barbiturates: NOT DETECTED
Benzodiazepines: POSITIVE — AB
Cocaine: NOT DETECTED
Opiates: NOT DETECTED
Tetrahydrocannabinol: POSITIVE — AB

## 2012-10-18 MED ORDER — GABAPENTIN 300 MG PO CAPS
ORAL_CAPSULE | ORAL | Status: AC
  Filled 2012-10-18: qty 1

## 2012-10-18 MED ORDER — MIRTAZAPINE 30 MG PO TABS
ORAL_TABLET | ORAL | Status: AC
  Filled 2012-10-18: qty 1

## 2012-10-18 MED ORDER — METHOCARBAMOL 500 MG PO TABS
500.0000 mg | ORAL_TABLET | Freq: Every day | ORAL | Status: DC
  Administered 2012-10-18: 500 mg via ORAL
  Filled 2012-10-18: qty 1

## 2012-10-18 MED ORDER — OLANZAPINE 5 MG PO TABS
ORAL_TABLET | ORAL | Status: AC
  Filled 2012-10-18: qty 6

## 2012-10-18 MED ORDER — GABAPENTIN 300 MG PO CAPS
300.0000 mg | ORAL_CAPSULE | Freq: Three times a day (TID) | ORAL | Status: DC
  Administered 2012-10-18 – 2012-10-19 (×3): 300 mg via ORAL
  Filled 2012-10-18 (×8): qty 1

## 2012-10-18 MED ORDER — OLANZAPINE 5 MG PO TABS
30.0000 mg | ORAL_TABLET | Freq: Every day | ORAL | Status: DC
  Administered 2012-10-18: 30 mg via ORAL
  Filled 2012-10-18: qty 3
  Filled 2012-10-18: qty 6
  Filled 2012-10-18: qty 3

## 2012-10-18 MED ORDER — MIRTAZAPINE 15 MG PO TABS
7.5000 mg | ORAL_TABLET | Freq: Every day | ORAL | Status: DC
  Administered 2012-10-18: 7.5 mg via ORAL
  Filled 2012-10-18 (×2): qty 1
  Filled 2012-10-18: qty 0.5

## 2012-10-18 NOTE — ED Notes (Signed)
Pt wants his night time meds, EDP notified.

## 2012-10-18 NOTE — ED Notes (Signed)
Pt presents to er with SI, states "this depression is making me want to end my life", pt also concerned that the medicine he is own now has caused him to almost "pass out", states that he "passed out" 5 days ago, had an episode several years ago of the same thing that was related to ?medicine, pt states that he wants to "end his life" because he is worried about his health, that nothing good is happening in his life right now. Pt's plan was to use the railroad tracks to end his life. Denies any HI. Has been hearing voices that are telling him to do "stupid stuff", denies any visual hallucination.

## 2012-10-18 NOTE — ED Provider Notes (Signed)
History    Scribed for Geoffery Lyons, MD, the patient was seen in room APA03/AP[facility]03 . This chart was scribed by Lewanda Rife.  CSN: [id number]  Arrival date & time 10/18/12  1747   First MD Initiated Contact with Patient 10/18/12 1823      Chief Complaint  Patient presents with  . V70.1    (Consider location/radiation/quality/duration/timing/severity/associated sxs/prior treatment) HPI Bruce Goodman is a 57 y.o. male who presents to the Emergency Department complaining of suicidal ideations with a plan to jump onto train tracks. Pt reports he feels depressed and doesn't want to live anymore. Pt states he currently lives with his daughter and her spouse, but reports he is unhappy there because the couple argues all the time. Pt states he has been hospitalized in the past for depression and was discharged a few weeks ago from [facility]. Pt states he smokes marijuana, but denies using any other elicit drugs.   Past Medical History  Diagnosis Date  . Bipolar 1 disorder   . Depression   . Schizophrenia     Past Surgical History  Procedure Date  . Wrist surgery     right    No family history on file.  History  Substance Use Topics  . Smoking status: Former Games developer  . Smokeless tobacco: Not on file  . Alcohol Use: No      Review of Systems  Constitutional: Negative.   HENT: Negative.   Respiratory: Negative.   Cardiovascular: Negative.   Gastrointestinal: Negative.   Musculoskeletal: Negative.   Skin: Negative.   Neurological: Negative.   Hematological: Negative.   Psychiatric/Behavioral: Positive for suicidal ideas. The patient is nervous/anxious.   All other systems reviewed and are negative.    Allergies  Haldol and Seroquel  Home Medications   Current Outpatient Rx  Name  Route  Sig  Dispense  Refill  . GABAPENTIN 600 MG PO TABS   Oral   Take 300 mg by mouth 3 (three) times daily.          Marland Kitchen METHOCARBAMOL 500 MG PO TABS   Oral   Take 500  mg by mouth 2 (two) times daily.         Marland Kitchen MIRTAZAPINE 15 MG PO TABS   Oral   Take 7.5 mg by mouth at bedtime.          Marland Kitchen OLANZAPINE 10 MG PO TABS   Oral   Take 10 mg by mouth at bedtime.         Marland Kitchen OLANZAPINE 15 MG PO TABS   Oral   Take 10 mg by mouth at bedtime.            BP 137/80  Pulse 101  Temp 98.2 F (36.8 C)  Resp 20  SpO2 98%  Physical Exam  Nursing note and vitals reviewed. Constitutional: He is oriented to person, place, and time. He appears well-developed and well-nourished.       Unkempt  HENT:  Head: Normocephalic and atraumatic.  Eyes: Conjunctivae normal are normal. Pupils are equal, round, and reactive to light.  Neck: Neck supple. No tracheal deviation present. No thyromegaly present.  Cardiovascular: Normal rate and regular rhythm.   No murmur heard. Pulmonary/Chest: Effort normal and breath sounds normal.  Abdominal: Soft. Bowel sounds are normal. He exhibits no distension. There is no tenderness.  Musculoskeletal: Normal range of motion. He exhibits no edema and no tenderness.  Neurological: He is alert and oriented to person, place, and  time. Coordination normal.  Skin: Skin is warm and dry. No rash noted.  Psychiatric: He has a normal mood and affect.    ED Course  Procedures (including critical care time)  Labs Reviewed - No data to display No results found.   No diagnosis found.    MDM  Patient with history of depression, presents with SI feeling depressed.  I have spoken with Samson Frederic from [facility] who informs me there are no beds that would be appropriate for him.  He will stay in the ED, to begin bed search in the AM.         I personally performed the services described in this documentation, which was scribed in my presence. The recorded information has been reviewed and is accurate.       Geoffery Lyons, MD 10/19/12 [phone]

## 2012-10-19 ENCOUNTER — Encounter (HOSPITAL_COMMUNITY): Payer: Self-pay | Admitting: *Deleted

## 2012-10-19 ENCOUNTER — Inpatient Hospital Stay (HOSPITAL_COMMUNITY)
Admission: AD | Admit: 2012-10-19 | Discharge: 2012-10-24 | DRG: 897 | Disposition: A | Discharge status: Home / Self Care | Payer: Medicare Other | Source: Ambulatory Visit | Attending: Psychiatry | Admitting: Psychiatry

## 2012-10-19 DIAGNOSIS — F1210 Cannabis abuse, uncomplicated: Principal | ICD-10-CM | POA: Diagnosis present

## 2012-10-19 DIAGNOSIS — F411 Generalized anxiety disorder: Secondary | ICD-10-CM | POA: Diagnosis present

## 2012-10-19 DIAGNOSIS — F99 Mental disorder, not otherwise specified: Secondary | ICD-10-CM

## 2012-10-19 DIAGNOSIS — Z5900 Homelessness unspecified: Secondary | ICD-10-CM

## 2012-10-19 DIAGNOSIS — F315 Bipolar disorder, current episode depressed, severe, with psychotic features: Secondary | ICD-10-CM

## 2012-10-19 DIAGNOSIS — F329 Major depressive disorder, single episode, unspecified: Secondary | ICD-10-CM

## 2012-10-19 DIAGNOSIS — F319 Bipolar disorder, unspecified: Secondary | ICD-10-CM | POA: Diagnosis not present

## 2012-10-19 DIAGNOSIS — R45851 Suicidal ideations: Secondary | ICD-10-CM

## 2012-10-19 DIAGNOSIS — F3130 Bipolar disorder, current episode depressed, mild or moderate severity, unspecified: Secondary | ICD-10-CM | POA: Diagnosis present

## 2012-10-19 DIAGNOSIS — Z79899 Other long term (current) drug therapy: Secondary | ICD-10-CM

## 2012-10-19 MED ORDER — OLANZAPINE 10 MG PO TBDP
10.0000 mg | ORAL_TABLET | Freq: Once | ORAL | Status: AC
  Administered 2012-10-19: 10 mg via ORAL
  Filled 2012-10-19 (×2): qty 1

## 2012-10-19 MED ORDER — MIRTAZAPINE 15 MG PO TABS
7.5000 mg | ORAL_TABLET | Freq: Once | ORAL | Status: AC
  Filled 2012-10-19: qty 1

## 2012-10-19 MED ORDER — POTASSIUM CHLORIDE CRYS ER 20 MEQ PO TBCR
40.0000 meq | EXTENDED_RELEASE_TABLET | Freq: Once | ORAL | Status: AC
  Administered 2012-10-19: 40 meq via ORAL
  Filled 2012-10-19: qty 2

## 2012-10-19 MED ORDER — GABAPENTIN 300 MG PO CAPS
300.0000 mg | ORAL_CAPSULE | Freq: Three times a day (TID) | ORAL | Status: DC
  Administered 2012-10-19 – 2012-10-20 (×2): 300 mg via ORAL
  Filled 2012-10-19 (×7): qty 1

## 2012-10-19 MED ORDER — MIRTAZAPINE 7.5 MG PO TABS
7.5000 mg | ORAL_TABLET | Freq: Every day | ORAL | Status: DC
  Administered 2012-10-19: 7.5 mg via ORAL
  Filled 2012-10-19 (×2): qty 1

## 2012-10-19 MED ORDER — ALUM & MAG HYDROXIDE-SIMETH 200-200-20 MG/5ML PO SUSP
30.0000 mL | ORAL | Status: DC | PRN
  Administered 2012-10-22 – 2012-10-23 (×4): 30 mL via ORAL

## 2012-10-19 MED ORDER — ACETAMINOPHEN 325 MG PO TABS: 650.0000 mg | ORAL_TABLET | Freq: Four times a day (QID) | ORAL | Status: DC | PRN

## 2012-10-19 MED ORDER — MAGNESIUM HYDROXIDE 400 MG/5ML PO SUSP
30.0000 mL | Freq: Every day | ORAL | Status: DC | PRN
  Administered 2012-10-24: 30 mL via ORAL

## 2012-10-19 NOTE — ED Provider Notes (Signed)
Patient has been alert and cooperative during this shift. He was informed he has been accepted to transfer to behavior health to Dr.Akintayo and he is agreeable.  Bruce Albe, MD, Armando Gang   Ward Givens, MD 10/19/12 [phone]

## 2012-10-19 NOTE — ED Provider Notes (Signed)
  Physical Exam  BP 114/66  Pulse 60  Temp 97.3 F (36.3 C) (Oral)  Resp 18  SpO2 98%  Physical Exam  ED Course  Procedures  MDM The patient has been quiet throughout the evening, is pending disposition this morning by behavioral health assessment team. They have been constant since last evening and will see the patient this morning. Vital signs remain stable,laboratory workup showed a drug screen positive for marijuana and benzodiazepines, urinalysis which was free of infection, metabolic panel with only mild hypokalemia, I will replace the potassium this morning. Blood counts which are normal.  Patient stable at this time.      Vida Roller, MD 10/19/12 [phone]

## 2012-10-19 NOTE — ED Notes (Signed)
Patient ate all of his breakfast. Now resting watching TV. Patient has been cooperative and pleasant.
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
## 2012-10-19 NOTE — Progress Notes (Signed)
Patient was admitted reporting that he was having SI to sit on railroad tracks. Patient reports hearing voices off and on and smokes thc occassionally. Patient admits he feels depressed. Patient came in for help and hopes to stop smoking thc so he can save his money up to get his license back.  Patient currently denies si/hi/a/v hallucinations.Patient was pleasant and cooperative, admission started by J.Wright RN and completed by Delories Heinz RN.

## 2012-10-19 NOTE — ED Notes (Signed)
MD at bedside. 

## 2012-10-19 NOTE — ED Notes (Signed)
Dr. Lynelle Doctor aware of pt accepted at [facility]

## 2012-10-19 NOTE — ED Notes (Signed)
Patient walked to restroom. Patient moved to from Room 3 to Room 15.

## 2012-10-19 NOTE — Tx Team (Signed)
Initial Interdisciplinary Treatment Plan  PATIENT STRENGTHS: (choose at least two) Ability for insight Motivation for treatment/growth  PATIENT STRESSORS: Substance abuse   PROBLEM LIST: Problem List/Patient Goals Date to be addressed Date deferred Reason deferred Estimated date of resolution  SI      Depression      THC use            Goal- Pt. Reports his goal is to quit smoking THC                               DISCHARGE CRITERIA:  Ability to meet basic life and health needs Adequate post-discharge living arrangements Improved stabilization in mood, thinking, and/or behavior Need for constant or close observation no longer present  PRELIMINARY DISCHARGE PLAN: Return to previous living arrangement  PATIENT/FAMIILY INVOLVEMENT: This treatment plan has been presented to and reviewed with the patient, Bruce Goodman, and/or family member.  The patient and family have been given the opportunity to ask questions and make suggestions.  Bruce Goodman 10/19/2012, 8:31 PM

## 2012-10-20 DIAGNOSIS — F329 Major depressive disorder, single episode, unspecified: Secondary | ICD-10-CM

## 2012-10-20 DIAGNOSIS — F1994 Other psychoactive substance use, unspecified with psychoactive substance-induced mood disorder: Secondary | ICD-10-CM

## 2012-10-20 DIAGNOSIS — R45851 Suicidal ideations: Secondary | ICD-10-CM

## 2012-10-20 DIAGNOSIS — F1910 Other psychoactive substance abuse, uncomplicated: Secondary | ICD-10-CM

## 2012-10-20 MED ORDER — MIRTAZAPINE 7.5 MG PO TABS
7.5000 mg | ORAL_TABLET | Freq: Every day | ORAL | Status: DC
  Administered 2012-10-20 – 2012-10-23 (×4): 7.5 mg via ORAL
  Filled 2012-10-20 (×5): qty 1

## 2012-10-20 MED ORDER — MIRTAZAPINE 15 MG PO TABS
15.0000 mg | ORAL_TABLET | Freq: Every day | ORAL | Status: DC
  Filled 2012-10-20 (×2): qty 1

## 2012-10-20 MED ORDER — OLANZAPINE 7.5 MG PO TABS
15.0000 mg | ORAL_TABLET | Freq: Every day | ORAL | Status: DC
  Filled 2012-10-20 (×2): qty 2

## 2012-10-20 MED ORDER — GABAPENTIN 300 MG PO CAPS
600.0000 mg | ORAL_CAPSULE | Freq: Three times a day (TID) | ORAL | Status: DC
  Administered 2012-10-20 – 2012-10-24 (×13): 600 mg via ORAL
  Filled 2012-10-20 (×15): qty 2

## 2012-10-20 MED ORDER — GABAPENTIN 600 MG PO TABS
300.0000 mg | ORAL_TABLET | Freq: Three times a day (TID) | ORAL | Status: DC
  Filled 2012-10-20 (×3): qty 0.5

## 2012-10-20 MED ORDER — OLANZAPINE 10 MG PO TABS
30.0000 mg | ORAL_TABLET | Freq: Every day | ORAL | Status: DC
  Administered 2012-10-20 – 2012-10-23 (×4): 30 mg via ORAL
  Filled 2012-10-20 (×5): qty 3

## 2012-10-20 MED ORDER — METHOCARBAMOL 500 MG PO TABS
500.0000 mg | ORAL_TABLET | Freq: Every day | ORAL | Status: DC
  Administered 2012-10-20 – 2012-10-23 (×4): 500 mg via ORAL
  Filled 2012-10-20 (×5): qty 1

## 2012-10-20 NOTE — [facility] Suicide Risk Assessment (Signed)
Suicide Risk Assessment  Admission Assessment     Nursing information obtained from:  Patient Demographic factors:  Male;Divorced or widowed;Caucasian;Low socioeconomic status Current Mental Status:  Suicidal ideation indicated by patient;Self-harm thoughts Loss Factors:  Unable to function well now Historical Factors:  Prior suicide attempts;Family history of mental illness or substance abuse Risk Reduction Factors:  Sense of responsibility to family;Religious beliefs about death;Living with another person, especially a relative  CLINICAL FACTORS:   Bipolar Disorder:   Depressive phase  COGNITIVE FEATURES THAT CONTRIBUTE TO RISK:  Closed-mindedness    SUICIDE RISK:   Mild:  Suicidal ideation of limited frequency, intensity, duration, and specificity.  There are no identifiable plans, no associated intent, mild dysphoria and related symptoms, good self-control (both objective and subjective assessment), few other risk factors, and identifiable protective factors, including available and accessible social support.  PLAN OF CARE:  Will increase remeron to 15 mg for depressive symptoms Will increase zyprexa to 15 mg QHS Will increase neurontin to 600 mg TID (home dose) for chronic pain Labs tom including lipids  I certify that inpatient services furnished can reasonably be expected to improve the patient's condition.  Wonda Cerise 10/20/2012, 11:24 AM

## 2012-10-20 NOTE — Progress Notes (Deleted)
Adult Psychoeducational Group Note  Date:  10/20/2012 Time:  1400  Group Topic/Focus:  Healthy Communication:   The focus of this group is to discuss communication, barriers to communication, as well as healthy ways to communicate with others.  Participation Level:  Did Not Attend   Barbette Merino, Percilla Tweten Shari Prows 10/20/2012, 3:00 PM

## 2012-10-20 NOTE — H&P (Signed)
Psychiatric Admission Assessment Adult  Patient Identification:  Bruce Goodman Date of Evaluation:  10/20/2012 Chief Complaint:  Schizoaffective Disorder  Depressive Disorder NOS  Agree with H&P from [facility] History of Present Illness: 57yo DWM. Presented to ED c/o depression and not wanting to live anymore. Endorsed suicidal ideation with a plan to jump onto the train tracks. Had been smoking THC. Today is no longer suicidal and depression is better because he is talking and making friends. Frustrated that one daughter is in jail and the other is in a wheelchair. Was admitted to [facility] a few weeks ago but likes our program better.   Associated Signs/Synptoms: Depression Symptoms:  feelings of worthlessness/guilt, anxiety, (Hypo) Manic Symptoms:  Irritable Mood, Anxiety Symptoms:  Excessive Worry, Psychotic Symptoms: denies  PTSD Symptoms: NA  Psychiatric Specialty Exam: Physical Exam  Constitutional: He is oriented to person, place, and time. He appears well-developed and well-nourished.  HENT:  Head: Normocephalic and atraumatic.       Edentulous not compensated   Eyes: Conjunctivae normal and EOM are normal. Pupils are equal, round, and reactive to light.  Neck: Normal range of motion. Neck supple.  Cardiovascular: Normal rate, regular rhythm and normal heart sounds.   Respiratory: Effort normal and breath sounds normal.  GI: Soft. Bowel sounds are normal.  Musculoskeletal: Normal range of motion.  Neurological: He is alert and oriented to person, place, and time.  Skin: Skin is warm and dry.  Psychiatric:       No longer suicidal - likes our program gets to talk and make friends     Review of Systems  Constitutional: Negative.   HENT: Negative.   Eyes: Negative.   Respiratory: Negative.   Cardiovascular: Negative.   Gastrointestinal: Negative.   Genitourinary: Negative.   Musculoskeletal: Negative.   Skin: Negative.   Neurological: Negative.     Endo/Heme/Allergies: Negative.   Psychiatric/Behavioral: Positive for depression, suicidal ideas and substance abuse.    Blood pressure 99/67, pulse 88, temperature 98.1 F (36.7 C), temperature source Oral, resp. rate 16, height 5\' 11"  (1.803 m), weight 73.483 kg (162 lb).Body mass index is 22.59 kg/(m^2).  General Appearance: Fairly Groomed  Eye Contact:  Good  Speech:  Clear and Coherent and Normal Rate  Volume:  Normal  Mood:  Euthymic  Affect:  Full Range  Thought Process:  Goal Directed, Linear and Logical  Orientation:  Full (Time, Place, and Person)  Thought Content:  No AVH/Psychosis   Suicidal Thoughts:  No  Resolved   Homicidal Thoughts:  No  Memory:  Intact   Judgement:  Fair  Insight:  Fair  Psychomotor Activity:  Normal  Concentration:  Good  Recall:  Good  Akathisia:  NA  Handed:  Right  AIMS (if indicated):     Assets:  Communication Skills Desire for Improvement Physical Health Resilience  Sleep:       Past Psychiatric History: Diagnosis:Depression  Hospitalizations: numerous   Outpatient Care:at [facility]   Substance Abuse Care:doesn't think THC counts   Self-Mutilation:  Suicidal Attempts:no real attempt or gesture just thoughts   Violent Behaviors:   Past Medical History:   Past Medical History  Diagnosis Date  . Bipolar 1 disorder   . Depression   . Schizophrenia    None. Allergies:   Allergies  Allergen Reactions  . Haldol (Haloperidol Decanoate)     REACTION: Alters mental status  . Seroquel (Quetiapine Fumerate)     REACTION: UNKNOWN REACTION   PTA  Medications: Prescriptions prior to admission  Medication Sig Dispense Refill  . gabapentin (NEURONTIN) 600 MG tablet Take 300 mg by mouth 3 (three) times daily.       . methocarbamol (ROBAXIN) 500 MG tablet Take 500 mg by mouth at bedtime.       . mirtazapine (REMERON) 15 MG tablet Take 7.5 mg by mouth at bedtime.       Marland Kitchen OLANZapine (ZYPREXA) 15 MG tablet Take 30 mg by mouth  at bedtime.         Previous Psychotropic Medications:  Medication/Dose                 Substance Abuse History in the last 12 months:  yes  Consequences of Substance Abuse: Legal Consequences:  no driver's license rides a bicycle  Social History:  reports that he has quit smoking. He does not have any smokeless tobacco history on file. He reports that he uses illicit drugs (Marijuana). He reports that he does not drink alcohol. Additional Social History: History of alcohol / drug use?: Yes Longest period of sobriety (when/how long): smokes marijuana  Negative Consequences of Use: Financial                    Current Place of Residence: lives with his daughter   Place of Birth:   Family Members: Marital Status:  Divorced Children:  Sons:  Daughters: Relationships: Education:  HS Graduate Educational Problems/Performance: Religious Beliefs/Practices: History of Abuse (Emotional/Phsycial/Sexual) Occupational Experiences; Military History:  None. Legal History: Hobbies/Interests:  Family History:  History reviewed. No pertinent family history.  Results for orders placed during the hospital encounter of 10/18/12 (from the past 72 hour(s))  CBC WITH DIFFERENTIAL     Status: Normal   Collection Time   10/18/12  5:41 PM      Component Value Range Comment   WBC 7.3  4.0 - 10.5 K/uL    RBC 4.46  4.22 - 5.81 MIL/uL    Hemoglobin 14.1  13.0 - 17.0 g/dL    HCT 16.1  09.6 - 04.5 %    MCV 92.8  78.0 - 100.0 fL    MCH 31.6  26.0 - 34.0 pg    MCHC 34.1  30.0 - 36.0 g/dL    RDW 40.9  81.1 - 91.4 %    Platelets 247  150 - 400 K/uL    Neutrophils Relative 60  43 - 77 %    Neutro Abs 4.4  1.7 - 7.7 K/uL    Lymphocytes Relative 30  12 - 46 %    Lymphs Abs 2.2  0.7 - 4.0 K/uL    Monocytes Relative 6  3 - 12 %    Monocytes Absolute 0.4  0.1 - 1.0 K/uL    Eosinophils Relative 3  0 - 5 %    Eosinophils Absolute 0.3  0.0 - 0.7 K/uL    Basophils Relative 1  0 - 1 %     Basophils Absolute 0.0  0.0 - 0.1 K/uL   COMPREHENSIVE METABOLIC PANEL     Status: Abnormal   Collection Time   10/18/12  5:41 PM      Component Value Range Comment   Sodium 139  135 - 145 mEq/L    Potassium 3.1 (*) 3.5 - 5.1 mEq/L    Chloride 98  96 - 112 mEq/L    CO2 36 (*) 19 - 32 mEq/L    Glucose, Bld 102 (*) 70 - 99 mg/dL  BUN 14  6 - 23 mg/dL    Creatinine, Ser 1.61  0.50 - 1.35 mg/dL    Calcium 9.4  8.4 - 09.6 mg/dL    Total Protein 6.9  6.0 - 8.3 g/dL    Albumin 3.6  3.5 - 5.2 g/dL    AST 13  0 - 37 U/L    ALT 8  0 - 53 U/L    Alkaline Phosphatase 57  39 - 117 U/L    Total Bilirubin 0.5  0.3 - 1.2 mg/dL    GFR calc non Af Amer 72 (*) >90 mL/min    GFR calc Af Amer 83 (*) >90 mL/min   ETHANOL     Status: Normal   Collection Time   10/18/12  5:41 PM      Component Value Range Comment   Alcohol, Ethyl (B) <11  0 - 11 mg/dL   URINALYSIS, ROUTINE W REFLEX MICROSCOPIC     Status: Abnormal   Collection Time   10/18/12  9:30 PM      Component Value Range Comment   Color, Urine YELLOW  YELLOW    APPearance CLOUDY (*) CLEAR    Specific Gravity, Urine 1.015  1.005 - 1.030    pH 7.5  5.0 - 8.0    Glucose, UA NEGATIVE  NEGATIVE mg/dL    Hgb urine dipstick NEGATIVE  NEGATIVE    Bilirubin Urine NEGATIVE  NEGATIVE    Ketones, ur TRACE (*) NEGATIVE mg/dL    Protein, ur NEGATIVE  NEGATIVE mg/dL    Urobilinogen, UA 2.0 (*) 0.0 - 1.0 mg/dL    Nitrite NEGATIVE  NEGATIVE    Leukocytes, UA NEGATIVE  NEGATIVE MICROSCOPIC NOT DONE ON URINES WITH NEGATIVE PROTEIN, BLOOD, LEUKOCYTES, NITRITE, OR GLUCOSE <1000 mg/dL.  URINE RAPID DRUG SCREEN (HOSP PERFORMED)     Status: Abnormal   Collection Time   10/18/12  9:30 PM      Component Value Range Comment   Opiates NONE DETECTED  NONE DETECTED    Cocaine NONE DETECTED  NONE DETECTED    Benzodiazepines POSITIVE (*) NONE DETECTED    Amphetamines NONE DETECTED  NONE DETECTED    Tetrahydrocannabinol POSITIVE (*) NONE DETECTED    Barbiturates NONE  DETECTED  NONE DETECTED    Psychological Evaluations:  Assessment:   AXIS I:  Substance Abuse and Substance Induced Mood Disorder AXIS II:  Deferred AXIS III:   Past Medical History  Diagnosis Date  . Bipolar 1 disorder   . Depression   . Schizophrenia    AXIS IV:  economic problems, educational problems, housing problems, occupational problems, other psychosocial or environmental problems, problems related to legal system/crime, problems related to social environment and problems with primary support group AXIS V:  51-60 moderate symptoms  Treatment Plan/Recommendations:   Adjust meds as indicated  Wants to go to the  [employer] and get a part time job to  be busy.  Treatment Plan Summary: Daily contact with patient to assess and evaluate symptoms and progress in treatment Medication management Current Medications:  Current Facility-Administered Medications  Medication Dose Route Frequency Provider Last Rate Last Dose  . acetaminophen (TYLENOL) tablet 650 mg  650 mg Oral Q6H PRN Larena Sox, MD      . alum & mag hydroxide-simeth (MAALOX/MYLANTA) 200-200-20 MG/5ML suspension 30 mL  30 mL Oral Q4H PRN Larena Sox, MD      . gabapentin (NEURONTIN) capsule 600 mg  600 mg Oral TID Wonda Cerise, MD  600 mg at 10/20/12 1144  . magnesium hydroxide (MILK OF MAGNESIA) suspension 30 mL  30 mL Oral Daily PRN Larena Sox, MD      . methocarbamol (ROBAXIN) tablet 500 mg  500 mg Oral QHS Fredrik Cove, PA-C      . mirtazapine (REMERON) tablet 7.5 mg  7.5 mg Oral QHS Fredrik Cove, PA-C      . OLANZapine (ZYPREXA) tablet 30 mg  30 mg Oral QHS Fredrik Cove, PA-C        Observation Level/Precautions:  15 minute checks  Laboratory:    Psychotherapy:    Medications:    Consultations:    Discharge Concerns:    Estimated LOS: 3 days   Other:     I certify that inpatient services furnished can reasonably be expected to improve the patient's condition.     Dwyane Dupree,MICKIE D.  RPA-C CAQ-Psych  1/18/20143:18 PM

## 2012-10-20 NOTE — Progress Notes (Signed)
Adult Psychosocial Assessment Update Interdisciplinary Team  [facility] admissions/discharges:  Admissions Discharges  Date:  06/28/12 Date:  06/28/12  Date:Date: 01/19/12 Date:  01/23/12  Date:Date: 04/21/12  Date:  04/23/12  Date: Date:  Date: Date:   Changes since the last Psychosocial Assessment (including adherence to outpatient mental health and/or substance abuse treatment, situational issues contributing to decompensation and/or relapse). Has been to [facility] two times since the last time was hospitalized at [facility] in September 2013.  Was actually trying to get admitted here, but this facility had no beds.  Was there for depression and suicidal ideation.  Was really considering suicide because there doesn't seem to be anything changing in his life.  Still has it in his head, but willing to contract for safety.  When comes to the hospital, makes friends with people who are like him.  Wants to make friends, does not have any at all except perhaps his children, and that is not what he is looking for.  He is bored, is on disability, wants something to do with his time.  Would like to get a job if possible, and has a friend who works at [employer] and is going to see if patient could also work there, without affecting disability.  Or could go back to the [facility] program at [facility].  Went there 5 years ago, but stopped going because was gone from his home 12 hours a day due to riding [place].  At that time, service was free and now is $3 each way, so not sure if he can go there.  His preference would be to go to a job, second option would be PSR.  Also willing to volunteer.           Discharge Plan 1. Will you be returning to the same living situation after discharge?   Yes:  X No:      If no, what is your plan?      Lives with daughter Marcelle Smiling, who just broke up with her boyfriend.  Therefore, patient has to do more financially to support her.  Also has two small  children in the home, so does not get privacy.  Does not want to return, but has to because has no other options.      2. Would you like a referral for services when you are discharged? Yes: XX    If yes, for what services?  No:       Right now sees Dr. Rosalia Hammers at [facility].  Is interested in possibly getting a referral back to [facility]).       Summary and Recommendations (to be completed by the evaluator) This is a 57yo Caucasian male with 6 hospitalizations in the last 9 months.  This time is hospitalized with voices telling him to kill himself; plan was to walk in front of a moving train. he voices are worse when he is stuck inside and the cold weather prevents him from going outside.   The patient is lonely and bored, and lacks a social life; would like to either get a job or return to Psychosocial Rehabilitation program at [facility] where he went about 5 years ago.  He lives with daughter and her two small children, although wants to get out on his own, perhaps have a roommate.  He sees Dr. Rosalia Hammers at [facility], would like to return there.  He would benefit from safety monitoring, medication evaluation, psychoeducation, group therapy, and discharge  planning to link with ongoing resources.                         Signature:  Sarina Ser, 10/20/2012 4:07 PM

## 2012-10-20 NOTE — Progress Notes (Signed)
Adult Psychoeducational Group Note  Date:  10/20/2012 Time:  1400  Group Topic/Focus:  Healthy Communication:   The focus of this group is to discuss communication, barriers to communication, as well as healthy ways to communicate with others.  Participation Level:  Minimal  Participation Quality:  Attentive  Affect:  Blunted and Depressed  Cognitive:  Alert  Insight: Appropriate  Engagement in Group:  Limited  Modes of Intervention:  Discussion, Problem-solving and Role-play  Additional Comments:    Bruce Goodman 10/20/2012, 2:42 PM

## 2012-10-20 NOTE — Progress Notes (Signed)
1315 [facility] Psych educational Group co hosted by Latoya L. MHT consisted of positive coping skills that pts can use during difficult times. Pts were also encouraged to try changing there lifestyle in a positive direction to prevent themselves from falling back into the back bad habits. Pt remain active and alert in group and remain appropriate during group. Pt feels that a lot was learned from this group. 

## 2012-10-20 NOTE — Clinical Social Work Psychosocial (Signed)
[facility] Group Notes:  (Clinical Social Work)  10/20/2012   3:00-4:00PM  Summary of Progress/Problems:   The main focus of today's process group was for the patient to identify ways in which they have in the past sabotaged their own recovery and reasons they may have done this/what they received from doing it.  We then worked to identify a current goal, how they might self-sabotage, the benefits and problems with that self-sabotaging behavior, and how motivated they are to change.  The patient expressed that his goal would be to stay out of the hospital, as he believed he had been hospitalized one time at [facility] and 2 times at [facility] in the past year.  During a private time later, CSW shared with him that this is his 6th hospitalization total (4 at [facility]) in the last 9 months.  He stated he is 100% motivated to change.  Type of Therapy:  Group Therapy - Process  Participation Level:  Active  Participation Quality:  Sharing and Supportive  Affect:  Blunted and Depressed  Cognitive:  Appropriate and Oriented  Insight:  Engaged  Engagement in Therapy:  Engaged  Modes of Intervention:  Clarification, Education, Limit-setting, Problem-solving, Socialization, Support and Processing, Exploration, Discussion   Ambrose Mantle, LCSW 10/20/2012, 5:59 PM

## 2012-10-20 NOTE — Progress Notes (Signed)
Patient observed up in the dayroom watching tv with little to no interaction with peers. Writer spoke with patient and he reports that he is pleased that the doctor got his medications ordered as he was taking them before coming in. Patient reports talking to his daughter today and her boyfriend has not returned and he is happy. Patient offered support and encouragement, denies si/hi/a/v hallucinations. Safety maintained on untit with 15 min checks, will continue to monitor.

## 2012-10-20 NOTE — Progress Notes (Signed)
Patient ID: Bruce Goodman, male   DOB: 05/21/1956, 57 y.o.   MRN: [id number] D: Pt is awake and active on the unit this AM. Pt endorses passive suicidal ideation but he is able to contract for safety. Pt is participating in the milieu and is cooperative with staff. Pt rates their depression at 6 and hopelessness at 7. Pt writes that he hopes to find a better place to live after discharge.   A: Writer utilized therapeutic communication, encouraged pt to discuss feelings with staff and administered medication per MD orders. Writer also encouraged pt to attend groups.  R: Pt is attending groups and tolerating medications well. Writer will continue to monitor. 15 minute checks are ongoing for safety.

## 2012-10-20 NOTE — H&P (Signed)
  Pt was seen by me today. Will continue current meds and see my SRA note for additional details.  The detailed H&P note will be done by mid level (NP/PA). 

## 2012-10-21 LAB — LIPID PANEL
Cholesterol: 171 mg/dL (ref 0–200)
HDL: 27 mg/dL — ABNORMAL LOW (ref >39)
LDL Cholesterol: 115 mg/dL — ABNORMAL HIGH (ref 0–99)
Total CHOL/HDL Ratio: 6.3 RATIO
Triglycerides: 143 mg/dL (ref <150)
VLDL: 29 mg/dL (ref 0–40)

## 2012-10-21 LAB — TSH: TSH: 1.897 u[IU]/mL (ref 0.350–4.500)

## 2012-10-21 LAB — HEMOGLOBIN A1C
Hgb A1c MFr Bld: 5.6 % (ref <5.7)
Mean Plasma Glucose: 114 mg/dL (ref <117)

## 2012-10-21 NOTE — Progress Notes (Signed)
[facility] MD Progress Note  10/21/2012 12:36 PM Bruce Goodman  MRN:  [id number] Subjective: Active in [place] slept through the night. Everything is fine spoke to his family on the phone. Wonders how he will get home.  Diagnosis:  Axis I: Substance Abuse and Substance Induced Mood Disorder  ADL's:  Intact  Sleep: Good  Appetite:  Good  Suicidal Ideation:  Denies  Homicidal Ideation:  Denies  AEB (as evidenced by):  Psychiatric Specialty Exam: Review of Systems  Constitutional: Negative.   HENT: Negative.   Eyes: Negative.   Respiratory: Negative.   Cardiovascular: Negative.   Gastrointestinal: Negative.   Genitourinary: Negative.   Musculoskeletal: Negative.   Skin: Negative.   Neurological: Negative.   Endo/Heme/Allergies: Negative.   Psychiatric/Behavioral: The patient is nervous/anxious.     Blood pressure 108/72, pulse 88, temperature 97.4 F (36.3 C), temperature source Oral, resp. rate 18, height 5\' 11"  (1.803 m), weight 73.483 kg (162 lb).Body mass index is 22.59 kg/(m^2).  General Appearance: Fairly Groomed  Eye Contact:  Good  Speech:  Normal Rate  Volume:  Normal  Mood:  Euthymic  Affect:  Full Range  Thought Process:  Goal Directed, Intact and Logical  Orientation:  Full (Time, Place, and Person)  Thought Content:  No AVH/psychosis   Suicidal Thoughts:  No  Homicidal Thoughts:  No  Memory:  Intact   Judgement:  Fair  Insight:  Present  Psychomotor Activity:  Normal  Concentration:  Good  Recall:  Good  Akathisia:  NA  Handed:  Right  AIMS (if indicated):     Assets:  Communication Skills Desire for Improvement Housing Physical Health Resilience Social Support  Sleep:  Number of Hours: 6.75    Current Medications: Current Facility-Administered Medications  Medication Dose Route Frequency Provider Last Rate Last Dose  . acetaminophen (TYLENOL) tablet 650 mg  650 mg Oral Q6H PRN Larena Sox, MD      . alum & mag hydroxide-simeth  (MAALOX/MYLANTA) 200-200-20 MG/5ML suspension 30 mL  30 mL Oral Q4H PRN Larena Sox, MD      . gabapentin (NEURONTIN) capsule 600 mg  600 mg Oral TID Wonda Cerise, MD   600 mg at 10/21/12 1159  . magnesium hydroxide (MILK OF MAGNESIA) suspension 30 mL  30 mL Oral Daily PRN Larena Sox, MD      . methocarbamol (ROBAXIN) tablet 500 mg  500 mg Oral QHS Fredrik Cove, PA-C   500 mg at 10/20/12 2100  . mirtazapine (REMERON) tablet 7.5 mg  7.5 mg Oral QHS Fredrik Cove, PA-C   7.5 mg at 10/20/12 2100  . OLANZapine (ZYPREXA) tablet 30 mg  30 mg Oral QHS Fredrik Cove, PA-C   30 mg at 10/20/12 2100    Lab Results:  Results for orders placed during the hospital encounter of 10/19/12 (from the past 48 hour(s))  TSH     Status: Normal   Collection Time   10/21/12  6:35 AM      Component Value Range Comment   TSH 1.897  0.350 - 4.500 uIU/mL   LIPID PANEL     Status: Abnormal   Collection Time   10/21/12  6:35 AM      Component Value Range Comment   Cholesterol 171  0 - 200 mg/dL    Triglycerides 782  <956 mg/dL    HDL 27 (*) >21 mg/dL    Total CHOL/HDL Ratio 6.3      VLDL 29  0 - 40 mg/dL    LDL Cholesterol 161 (*) 0 - 99 mg/dL     Physical Findings: AIMS: Facial and Oral Movements Muscles of Facial Expression: None, normal Lips and Perioral Area: None, normal Jaw: None, normal Tongue: None, normal,Extremity Movements Upper (arms, wrists, hands, fingers): None, normal Lower (legs, knees, ankles, toes): None, normal, Trunk Movements Neck, shoulders, hips: None, normal, Overall Severity Severity of abnormal movements (highest score from questions above): None, normal Incapacitation due to abnormal movements: None, normal Patient's awareness of abnormal movements (rate only patient's report): No Awareness, Dental Status Current problems with teeth and/or dentures?: Yes (no teeth, does not wear dentures) Does patient usually wear dentures?: No  CIWA:    COWS:     Treatment  Plan Summary: Daily contact with patient to assess and evaluate symptoms and progress in treatment Medication management  Plan:  Medical Decision Making Problem Points:  Established problem, stable/improving (1), Review of last therapy session (1) and Review of psycho-social stressors (1) Data Points:  Decision to obtain old records (1) Review or order clinical lab tests (1) Review or order medicine tests (1) Review and summation of old records (2) Review of medication regiment & side effects (2)  I certify that inpatient services furnished can reasonably be expected to improve the patient's condition.   Bruce Goodman,[name]  10/21/2012, 12:36 PM

## 2012-10-21 NOTE — Progress Notes (Signed)
Adult Psychoeducational Group Note  Date:  10/21/2012 Time:  0915  Group Topic/Focus:  Self Inventory Review  Participation Level:  Minimal  Participation Quality:  Appropriate and Attentive  Affect:  Depressed and flat  Cognitive:  Alert and Appropriate  Insight: Limited  Engagement in Group:  Limited  Modes of Intervention:  Discussion and Exploration  Additional Comments:    Junella Domke Shari Prows 10/21/2012, 10:23 AM

## 2012-10-21 NOTE — Progress Notes (Signed)
Patient ID: Bruce Goodman, male   DOB: 01-28-1956, 57 y.o.   MRN: [id number] D: Pt is awake and active on the unit this AM. Pt denies SI/HI and A/V hallucinations. Pt is participating in the milieu and is cooperative with staff. Pt rates their depression at 2 and hopelessness at 1. Pt writes that he wants to talk more after discharge in order to take better care of himself. However, pt forwards little in group this AM. Pt mood is sad and his affect is flat/depressed.   A: Writer utilized therapeutic communication, encouraged pt to discuss feelings with staff and administered medication per MD orders. Writer also encouraged pt to attend groups.  R: Pt is attending groups and tolerating medications well. Writer will continue to monitor. 15 minute checks are ongoing for safety.

## 2012-10-21 NOTE — Progress Notes (Signed)
Adult Psychoeducational Group Note  Date:  10/21/2012 Time:  10:59 PM  Group Topic/Focus:  Wrap-Up Group:   The focus of this group is to help patients review their daily goal of treatment and discuss progress on daily workbooks.  Participation Level:  Active  Participation Quality:  Attentive  Affect:  Appropriate  Cognitive:  Appropriate  Insight: Appropriate  Engagement in Group:  Engaged  Modes of Intervention:  Discussion  Additional Comments:  Patient shared that he had so much stress and responsibilty at home but he was more equipped to face that now he has been in the hospital.  Yousaf Sainato, Newton Pigg 10/21/2012, 10:59 PM

## 2012-10-21 NOTE — Clinical Social Work Psychosocial (Signed)
[facility] Group Notes:  (Clinical Social Work)  10/21/2012   3:00-4:00PM  Summary of Progress/Problems:   Summary of Progress/Problems:   The main focus of today's process group was for the patient to define "support" and describe what healthy supports are.  An emphasis was placed on using therapist, doctor, therapy groups, self-help groups and problem-specific support groups to expand supports.  In the last part of group, we listened to a variety of music to determine emotional reactions and assist patients in identifying ways to incorporate music into their wellness plans.   The patient expressed appreciation of many of the kinds of music, but it was in terms of whether it was good music, not if it would help with sleep, depression, or other symptoms, regardless of efforts by CSW to redirect.  Type of Therapy:  Process Group  Participation Level:  Active  Participation Quality:  Attentive and Sharing  Affect:  Blunted  Cognitive:  Confused  Insight:  Distracting  Engagement in Therapy:  Engaged  Modes of Intervention:  Clarification, Education, Limit-setting, Problem-solving, Socialization, Support and Processing, Exploration, Discussion, Role-Play   Ambrose Mantle, LCSW 10/21/2012, 4:28 PM

## 2012-10-22 DIAGNOSIS — F329 Major depressive disorder, single episode, unspecified: Secondary | ICD-10-CM

## 2012-10-22 DIAGNOSIS — F3289 Other specified depressive episodes: Secondary | ICD-10-CM

## 2012-10-22 NOTE — Progress Notes (Signed)
Patient has been up and active on the unit, attended group this evening and has voiced no complaints. Patient reports that he spent time talking with a Film/video editor today working on his goal. Patient reports that by him stopping smoking thc he can save the money he is spending on thc and work toward getting his license and buying him a car. Patient currently denies having pain, -si/hi/a/v hall. Support and encouragement offered, safety maintained on unit, will continue to monitor.

## 2012-10-22 NOTE — Progress Notes (Signed)
D:  Per pt self inventory pt reports sleeping well, appetite good, energy level normal,  rates depression at 1 out of 10 and hopelessness at a 1 out of 10, denies SI/HI/AVH.      A:  Emotional support provided, Encouraged pt to continue with treatment plan and attend all group activities, q15 min checks maintained for safety.  R:  Pt is receptive, calm and cooperative.

## 2012-10-22 NOTE — Progress Notes (Signed)
Patient ID: Bruce Goodman, male   DOB: 18-Sep-1956, 57 y.o.   MRN: [id number]  PER STATE REGULATIONS 482.30  THIS CHART WAS REVIEWED FOR MEDICAL NECESSITY WITH RESPECT TO THE PATIENT'S ADMISSION/ DURATION OF STAY.  NEXT REVIEW DATE: 10/22/2012  Willa Rough, RN, BSN CASE MANAGER

## 2012-10-22 NOTE — Tx Team (Signed)
Interdisciplinary Treatment Plan Update (Adult)  Date:  10/22/2012  Time Reviewed:  9:47 AM   Progress in Treatment: Attending groups:   Yes   Participating in groups:  Yes Taking medication as prescribed:  Yes Tolerating medication:  Yes Family/Significant othe contact made: Contact to be made with family Patient understands diagnosis:  Yes Discussing patient identified problems/goals with staff: Yes Medical problems stabilized or resolved: Yes Denies suicidal/homicidal ideation:Yes Issues/concerns per patient self-inventory:  Other:   New problem(s) identified:  Reason for Continuation of Hospitalization: Anxiety Depression Medication stabilization   Interventions implemented related to continuation of hospitalization:  Medication Management; safety checks q 15 mins  Additional comments:  Estimated length of stay:  2-3 days  Discharge Plan:  Home with outpatient follow up  New goal(s):  Review of initial/current patient goals per problem list:    1.  Goal(s): Eliminate SI/other thoughts of self harm   Met:  Yes  Target date: d/c  As evidenced by: Patient no longer endorses SI/HI or other thoughts of self harm.    2.  Goal (s):Reduce depression/anxiety (rates all symptoms at one today)  Met: Yes  Target date: d/c  As evidenced by: Patient rates symptoms at four or below    3.  Goal(s):.stabilize on meds   Met:  No  Target date: d/c  As evidenced by: Patient will report being stabilized on medications - less symptomatic    4.  Goal(s): Refer for outpatient follow up   Met:  No  Target date: d/c  As evidenced by: Follow up appointment will be scheduled    Attendees: Patient:   10/22/2012 9:47 AM  Physican:  Patrick North, MD 10/22/2012 9:47 AM  Nursing:  Quintella Reichert, RN 10/22/2012 9:47 AM   Nursing:   Dellia Cloud,  RN 10/22/2012 9:47 AM   Clinical Social Worker:  Juline Patch, LCSW 10/22/2012 9:47 AM   Other: Tera Helper,  PHM-NP 10/22/2012 9:47 AM   Other:         10/22/2012 9:47 AM Other:        10/22/2012 9:47 AM

## 2012-10-22 NOTE — Clinical Social Work Note (Signed)
[facility] LCSW Aftercare Discharge Planning Group Note       8:30-9:30 AM  1/20/201412:59 PM  Participation Quality:  Appropriate  Affect:  Appropriate  Cognitive:  Appropriate  Insight:  Engaged  Engagement in Group:  Engaged  Modes of Intervention:  Education, Exploration, Problem-solving and Support  Summary of Progress/Problems:  Patient seen during planning group.  He advised of becoming depressed and SI due to argument between daughter and her boyfriend.  Patient states he is doing well today and denies SI/HI.  He rates all symptoms at one.  He shared he plans to return to daughter's home at discharge.  [name] 10/22/2012 12:59 PM

## 2012-10-22 NOTE — Progress Notes (Signed)
D: Patient in the hallway on approach.  Patient pleasant and cooperative.  Patient states he had a good day.  Patient states he thinks the food he has been eating has been giving him gas.  Patient denies SI/HI and denies AVH.  Patient visible and interacting with peers on the unit. A: Staff to monitor Q 15 mins for safety.  Encouragement and support offered.  Scheduled medications administered per orders.  Maalox administered prn for gas and indigestion. R: Patient remains safe on the unit.  Patient attended group tonight but Clinical research associate observed patient leaving group before it is over.  Patient taking medications administered.  Patient states he had gas relief instantly once he took Maalox.

## 2012-10-22 NOTE — Progress Notes (Signed)
Adult Psychoeducational Group Note  Date:  10/22/2012 Time:  6:30 PM  Group Topic/Focus:  Self Care:   The focus of this group is to help patients understand the importance of self-care in order to improve or restore emotional, physical, spiritual, interpersonal, and financial health.  Participation Level:  Active  Participation Quality:  Appropriate, Attentive and Sharing  Affect:  Appropriate  Cognitive:  Appropriate  Insight: Appropriate  Engagement in Group:  Engaged  Modes of Intervention:  Clarification, Education and Reality Testing  Additional Comments:  Brolin attend group and participated. Patient was asked to define self-care and the importance of it. During group, patient was asked to complete self-care assessment in the workbook. The assessment consisted of relationship, physical, psychological, emotional, and spiritual care. Patient completed form and discussed what areas that could use improvement and how, and the strengths in other areas.    Karleen Hampshire Brittini 10/22/2012, 6:30 PM

## 2012-10-22 NOTE — Clinical Social Work Note (Signed)
[facility] LCSW Group Therapy          Overcoming Obstacles       1:15 -2:30        10/22/2012   3:00 PM     Type of Therapy:  Group Therapy  Participation Level:  Appropriate  Participation Quality:  Appropriate  Affect:  Appropriate  Cognitive:  Attentive Appropriate  Insight:  Engaged  Engagement in Therapy:  Engaged  Modes of Intervention:  Discussion Exploration Problem-Solving Supportive  Summary of Progress/Problems:  Patient shared the obstacle he needs to overcome is his daughters.  He stated they are always asking him for money.  He stated the daughter he lives with stays up late at night and expects him to get up and get her son off to school.  Patient stated he is considering getting his own apartment.  Juline Patch Hairston 10/22/2012    3:00 PM

## 2012-10-22 NOTE — Progress Notes (Signed)
[facility] MD Progress Note  10/22/2012 11:29 AM Bruce Goodman  MRN:  [id number] Subjective:  Patient seen today. Tolerating medications well without side effects. States he is concerned about his daughter who is in a wheel chair.  Diagnosis:   Axis I: Depressive Disorder NOS Axis II: Deferred Axis III:  Past Medical History  Diagnosis Date  . Bipolar 1 disorder   . Depression   . Schizophrenia    Axis IV: occupational problems and other psychosocial or environmental problems Axis V: 41-50 serious symptoms  ADL's:  Intact  Sleep: Fair  Appetite:  Fair   Psychiatric Specialty Exam: ROS  Blood pressure 113/77, pulse 93, temperature 97.8 F (36.6 C), temperature source Oral, resp. rate 18, height 5\' 11"  (1.803 m), weight 73.483 kg (162 lb).Body mass index is 22.59 kg/(m^2).  General Appearance: Casual  Eye Contact::  Fair  Speech:  Slow  Volume:  Normal  Mood:  Depressed and Dysphoric  Affect:  Depressed and Flat  Thought Process:  Coherent  Orientation:  Full (Time, Place, and Person)  Thought Content:  WDL  Suicidal Thoughts:  No  Homicidal Thoughts:  No  Memory:  Immediate;   Fair Recent;   Fair Remote;   Fair  Judgement:  Fair  Insight:  Fair  Psychomotor Activity:  Normal  Concentration:  Fair  Recall:  Fair  Akathisia:  No  Handed:  Right  AIMS (if indicated):     Assets:  Communication Skills Desire for Improvement Housing Social Support  Sleep:  Number of Hours: 6.25    Current Medications: Current Facility-Administered Medications  Medication Dose Route Frequency Provider Last Rate Last Dose  . acetaminophen (TYLENOL) tablet 650 mg  650 mg Oral Q6H PRN Larena Sox, MD      . alum & mag hydroxide-simeth (MAALOX/MYLANTA) 200-200-20 MG/5ML suspension 30 mL  30 mL Oral Q4H PRN Larena Sox, MD      . gabapentin (NEURONTIN) capsule 600 mg  600 mg Oral TID Wonda Cerise, MD   600 mg at 10/22/12 0755  . magnesium hydroxide (MILK OF MAGNESIA) suspension  30 mL  30 mL Oral Daily PRN Larena Sox, MD      . methocarbamol (ROBAXIN) tablet 500 mg  500 mg Oral QHS Fredrik Cove, PA-C   500 mg at 10/21/12 2116  . mirtazapine (REMERON) tablet 7.5 mg  7.5 mg Oral QHS Fredrik Cove, PA-C   7.5 mg at 10/21/12 2115  . OLANZapine (ZYPREXA) tablet 30 mg  30 mg Oral QHS Fredrik Cove, PA-C   30 mg at 10/21/12 2115    Lab Results:  Results for orders placed during the hospital encounter of 10/19/12 (from the past 48 hour(s))  TSH     Status: Normal   Collection Time   10/21/12  6:35 AM      Component Value Range Comment   TSH 1.897  0.350 - 4.500 uIU/mL   LIPID PANEL     Status: Abnormal   Collection Time   10/21/12  6:35 AM      Component Value Range Comment   Cholesterol 171  0 - 200 mg/dL    Triglycerides 409  <811 mg/dL    HDL 27 (*) >91 mg/dL    Total CHOL/HDL Ratio 6.3      VLDL 29  0 - 40 mg/dL    LDL Cholesterol 478 (*) 0 - 99 mg/dL   HEMOGLOBIN G9F     Status: Normal  Collection Time   10/21/12  6:35 AM      Component Value Range Comment   Hemoglobin A1C 5.6  <5.7 %    Mean Plasma Glucose 114  <117 mg/dL     Physical Findings: AIMS: Facial and Oral Movements Muscles of Facial Expression: None, normal Lips and Perioral Area: None, normal Jaw: None, normal Tongue: None, normal,Extremity Movements Upper (arms, wrists, hands, fingers): None, normal Lower (legs, knees, ankles, toes): None, normal, Trunk Movements Neck, shoulders, hips: None, normal, Overall Severity Severity of abnormal movements (highest score from questions above): None, normal Incapacitation due to abnormal movements: None, normal Patient's awareness of abnormal movements (rate only patient's report): No Awareness, Dental Status Current problems with teeth and/or dentures?: Yes (no teeth, does not wear dentures) Does patient usually wear dentures?: No  CIWA:    COWS:     Treatment Plan Summary: Daily contact with patient to assess and evaluate  symptoms and progress in treatment Medication management  Plan: Continue current plan of care. Encouraged patient to attend groups. Plan discharge with follow up appointments.  Medical Decision Making Problem Points:  Established problem, stable/improving (1), Review of last therapy session (1) and Review of psycho-social stressors (1) Data Points:  Review of new medications or change in dosage (2)  I certify that inpatient services furnished can reasonably be expected to improve the patient's condition.   Bruce Goodman 10/22/2012, 11:29 AM

## 2012-10-22 NOTE — Progress Notes (Signed)
Patient ID: Bruce Goodman, male   DOB: [birth year], 57 y.o.   MRN: [id number]  PER STATE REGULATIONS 482.30  THIS CHART WAS REVIEWED FOR MEDICAL NECESSITY WITH RESPECT TO THE PATIENT'S ADMISSION/ DURATION OF STAY.  NEXT REVIEW DATE: 10/25/2012  Willa Rough, RN, BSN CASE MANAGER

## 2012-10-23 DIAGNOSIS — F329 Major depressive disorder, single episode, unspecified: Secondary | ICD-10-CM | POA: Diagnosis not present

## 2012-10-23 NOTE — Progress Notes (Signed)
Adult Psychoeducational Group Note  Date:  10/23/2012 Time:  2:49 PM  Group Topic/Focus:  Recovery Goals:   The focus of this group is to identify appropriate goals for recovery and establish a plan to achieve them.  Participation Level:  Active  Participation Quality:  Appropriate, Attentive and Sharing  Affect:  Appropriate  Cognitive:  Appropriate  Insight: Appropriate  Engagement in Group:  Engaged  Modes of Intervention:  Discussion  Additional Comments: Pt was appropriate and attentive while attending group. Pt shared that going to groups and learning while be important to his recovery when going to [facility].   Sharyn Lull 10/23/2012, 2:49 PM

## 2012-10-23 NOTE — Progress Notes (Signed)
[facility] MD Progress Note  10/23/2012 11:01 AM Bruce Goodman  MRN:  [id number] Subjective:  Patient reports doing better, mood improving. Tolerating his medications well. Denies any side effects.  Diagnosis:   Axis I: Depressive Disorder NOS Axis II: Deferred Axis III:  Past Medical History  Diagnosis Date  . Bipolar 1 disorder   . Depression   . Schizophrenia    Axis IV: occupational problems and other psychosocial or environmental problems Axis V: 51-60 moderate symptoms  ADL's:  Intact  Sleep: Fair  Appetite:  Fair    Psychiatric Specialty Exam: Review of Systems  Constitutional: Negative.   Eyes: Negative.   Respiratory: Negative.   Cardiovascular: Negative.   Gastrointestinal: Negative.   Genitourinary: Negative.   Musculoskeletal: Negative.   Skin: Negative.   Neurological: Negative.   Endo/Heme/Allergies: Negative.   Psychiatric/Behavioral: Positive for depression. The patient is nervous/anxious.     Blood pressure 109/71, pulse 96, temperature 98.1 F (36.7 C), temperature source Oral, resp. rate 16, height 5\' 11"  (1.803 m), weight 73.483 kg (162 lb).Body mass index is 22.59 kg/(m^2).  General Appearance: Casual  Eye Contact::  Fair  Speech:  Clear and Coherent  Volume:  Normal  Mood:  Anxious  Affect:  Appropriate  Thought Process:  Coherent  Orientation:  Full (Time, Place, and Person)  Thought Content:  WDL  Suicidal Thoughts:  No  Homicidal Thoughts:  No  Memory:  Immediate;   Fair Recent;   Fair Remote;   Fair  Judgement:  Fair  Insight:  Fair  Psychomotor Activity:  Normal  Concentration:  Fair  Recall:  Fair  Akathisia:  No  Handed:  Right  AIMS (if indicated):     Assets:  Communication Skills Desire for Improvement Housing Social Support  Sleep:  Number of Hours: 6.75    Current Medications: Current Facility-Administered Medications  Medication Dose Route Frequency Provider Last Rate Last Dose  . acetaminophen (TYLENOL) tablet  650 mg  650 mg Oral Q6H PRN Larena Sox, MD      . alum & mag hydroxide-simeth (MAALOX/MYLANTA) 200-200-20 MG/5ML suspension 30 mL  30 mL Oral Q4H PRN Larena Sox, MD   30 mL at 10/23/12 0752  . gabapentin (NEURONTIN) capsule 600 mg  600 mg Oral TID Wonda Cerise, MD   600 mg at 10/23/12 0748  . magnesium hydroxide (MILK OF MAGNESIA) suspension 30 mL  30 mL Oral Daily PRN Larena Sox, MD      . methocarbamol (ROBAXIN) tablet 500 mg  500 mg Oral QHS Fredrik Cove, PA-C   500 mg at 10/22/12 2120  . mirtazapine (REMERON) tablet 7.5 mg  7.5 mg Oral QHS Fredrik Cove, PA-C   7.5 mg at 10/22/12 2120  . OLANZapine (ZYPREXA) tablet 30 mg  30 mg Oral QHS Fredrik Cove, PA-C   30 mg at 10/22/12 2120    Lab Results: No results found for this or any previous visit (from the past 48 hour(s)).  Physical Findings: AIMS: Facial and Oral Movements Muscles of Facial Expression: None, normal Lips and Perioral Area: None, normal Jaw: None, normal Tongue: None, normal,Extremity Movements Upper (arms, wrists, hands, fingers): None, normal Lower (legs, knees, ankles, toes): None, normal, Trunk Movements Neck, shoulders, hips: None, normal, Overall Severity Severity of abnormal movements (highest score from questions above): None, normal Incapacitation due to abnormal movements: None, normal Patient's awareness of abnormal movements (rate only patient's report): No Awareness, Dental Status Current problems with teeth and/or  dentures?: Yes (no teeth, does not wear dentures) Does patient usually wear dentures?: No  CIWA:    COWS:     Treatment Plan Summary: Daily contact with patient to assess and evaluate symptoms and progress in treatment Medication management  Plan: Continue current plan of care. Plan for discharge tomorrow..  Medical Decision Making Problem Points:  Established problem, stable/improving (1), Review of last therapy session (1) and Review of psycho-social stressors  (1) Data Points:  Review of medication regiment & side effects (2)  I certify that inpatient services furnished can reasonably be expected to improve the patient's condition.   Carless Slatten 10/23/2012, 11:01 AM

## 2012-10-23 NOTE — Progress Notes (Signed)
D:  Patient up and active in the milieu today.  Has attended and participated in groups.  He denies depressive symptoms, hopelessness, or suicidal ideation.  He reports sleep and appetite are good.   A:  Medications as per schedule.  Encouraged participation in groups.   R:  Pleasant and cooperative.  Interacting well with staff and peers.  Tolerating medications well.

## 2012-10-23 NOTE — Progress Notes (Signed)
[facility] LCSW Group Therapy  10/23/2012 2:59 PM  Type of Therapy:  Group Therapy  Participation Level:  Minimal  Participation Quality:  Appropriate  Affect:  Appropriate  Cognitive:  Appropriate  Insight:  Lacking  Engagement in Therapy:  Limited  Modes of Intervention:  Confrontation, Discussion, Problem-solving and Support  Summary of Progress/Problems:  Patient stated he has always ignored his diagnosis because he has never done anything schizophrenic.    [name] 10/23/2012, 2:59 PM

## 2012-10-23 NOTE — Progress Notes (Signed)
D: Patient in dayroom on approach.  Patient does appear flat and depressed however patient states he feels fine and has no complaints.  Patient states it has been a long day.  Patient states he is learning to cope and learning to talk to people.  Patient states there is a possibility that he will be discharged tomorrow.  Patient states she thinks he is ready for discharge.  Patient denies SI/HI and denies AVH  A: Staff to monitor Q 15 mins for safety.  Encouragement and support offered.  Scheduled medications administered per orders. R: Patient remains safe on the unit.  Patient attended group tonight, and patient left group once to use the restroom.  Patient taking administered medications.

## 2012-10-23 NOTE — Progress Notes (Signed)
Grief and Loss Group  The focus of the group tended toward loss of relationships and loss of life opportunities. Several members participated and supported others. A focus on the experience of grief feelings and helpful reactions to manage grief was discussed at end of group.   Pt was very  Attentive, but did not share during group  [employer], [employer]

## 2012-10-23 NOTE — Clinical Social Work Note (Signed)
[facility] LCSW Aftercare Discharge Planning Group Note       8:30-9:30 AM  1/21/201410:31 AM  Participation Quality:  Appropriate  Affect:  Appropriate  Cognitive:  Appropriate  Insight:  Engaged  Engagement in Group:  Engaged  Modes of Intervention:  Education, Exploration, Problem-solving and Support  Summary of Progress/Problems:  Patient reports doing well and hopes to discharge home soon.  He denies SI/HI and rates symptoms at zero.  [name] 10/23/2012 10:31 AM

## 2012-10-23 NOTE — Tx Team (Deleted)
Patient discussed as a called treatment team.  MD is requesting patient be placed on an outpatient commitment prior to discharge.  She also questioned referring patient for an ACT Team.  She was informed patient will not meet criteria for an ACT Team as he has not had multiple hospitalizations.  Patient is showing clinical improvement.

## 2012-10-24 DIAGNOSIS — F3130 Bipolar disorder, current episode depressed, mild or moderate severity, unspecified: Secondary | ICD-10-CM | POA: Diagnosis not present

## 2012-10-24 DIAGNOSIS — F411 Generalized anxiety disorder: Secondary | ICD-10-CM

## 2012-10-24 MED ORDER — OLANZAPINE 15 MG PO TABS: 30.0000 mg | ORAL_TABLET | Freq: Every day | ORAL | Status: DC

## 2012-10-24 MED ORDER — GABAPENTIN 600 MG PO TABS: 600.0000 mg | ORAL_TABLET | Freq: Three times a day (TID) | ORAL | Status: DC

## 2012-10-24 MED ORDER — LOPERAMIDE HCL 2 MG PO CAPS
2.0000 mg | ORAL_CAPSULE | ORAL | Status: DC | PRN
  Administered 2012-10-24: 2 mg via ORAL

## 2012-10-24 MED ORDER — MIRTAZAPINE 7.5 MG PO TABS: 7.5000 mg | ORAL_TABLET | Freq: Every day | ORAL | Status: DC

## 2012-10-24 MED ORDER — METHOCARBAMOL 500 MG PO TABS: 500.0000 mg | ORAL_TABLET | Freq: Every day | ORAL | Status: DC

## 2012-10-24 NOTE — [facility] Suicide Risk Assessment (Signed)
Suicide Risk Assessment  Discharge Assessment     Demographic Factors:  Male, Caucasian and Low socioeconomic status  Mental Status Per Nursing Assessment::   On Admission:  Suicidal ideation indicated by patient;Self-harm thoughts  Current Mental Status by Physician: Patient alert and oriented to 4.Denies AH/VH/SI/HI.  Loss Factors: Decrease in vocational status and Financial problems/change in socioeconomic status  Historical Factors: Impulsivity  Risk Reduction Factors:   Sense of responsibility to family, Living with another person, especially a relative and Positive coping skills or problem solving skills  Continued Clinical Symptoms:  Depression:   Recent sense of peace/wellbeing  Cognitive Features That Contribute To Risk:  Cognitively intact  Suicide Risk:  Minimal: No identifiable suicidal ideation.  Patients presenting with no risk factors but with morbid ruminations; may be classified as minimal risk based on the severity of the depressive symptoms  Discharge Diagnoses:   AXIS I:  Depressive Disorder NOS AXIS II:  No diagnosis AXIS III:   Past Medical History  Diagnosis Date  . Bipolar 1 disorder   . Depression   . Schizophrenia    AXIS IV:  occupational problems and other psychosocial or environmental problems AXIS V:  61-70 mild symptoms  Plan Of Care/Follow-up recommendations:  Activity:  normal Diet:  normal Follow up with outpatient appointments.  Is patient on multiple antipsychotic therapies at discharge:  No   Has Patient had three or more failed trials of antipsychotic monotherapy by history:  No  Recommended Plan for Multiple Antipsychotic Therapies: NA  Jaidynn Balster 10/24/2012, 11:08 AM

## 2012-10-24 NOTE — Discharge Summary (Signed)
Physician Discharge Summary Note  Patient:  Bruce Goodman is an 57 y.o., male MRN:  [id number] DOB:  07-03-1956 Patient phone:  [phone] (home)  Patient address:   [address] [place] [address],   Date of Admission:  10/19/2012 Date of Discharge: 10/24/2012  Reason for Admission:  Depression with suicidal ideations  Discharge Diagnoses: Active Problems:  Cannabis abuse  Review of Systems  Constitutional: Negative.   HENT: Negative.   Eyes: Negative.   Respiratory: Negative.   Cardiovascular: Negative.   Gastrointestinal: Negative.   Genitourinary: Negative.   Musculoskeletal: Negative.   Skin: Negative.   Neurological: Negative.   Endo/Heme/Allergies: Negative.   Psychiatric/Behavioral: The patient is nervous/anxious.    Axis Diagnosis:   AXIS I:  Anxiety Disorder NOS and Bipolar, Depressed AXIS II:  Deferred AXIS III:   Past Medical History  Diagnosis Date  . Bipolar 1 disorder   . Depression   . Schizophrenia    AXIS IV:  other psychosocial or environmental problems, problems related to social environment and problems with primary support group AXIS V:  61-70 mild symptoms  Level of Care:  OP  Hospital Course:  Review of chart, vital signs, medications, and notes. 1-Individual and group therapy attended 2-Medication management for depression and anxiety to reduce current symptoms to base line and improve the patient's overall level of functioning:  Medications reviewed with the patient and he stated no untoward effects.  Rx given at discharge. 4-Coping skills for depression and anxiety developed and utilized 5-Crisis stabilization and management completed 6-Addressed health issues, stable for discharge. 7-Treatment plan in place to prevent relapse of depression and anxiety 8-Psychosocial education regarding relapse prevention and self-care completed 9-Patient denied suicidal/homicidal ideations and auditory/visual hallucinations, follow-up  appointments encouraged to attend, outside support groups encouraged and information given.  Add was concerned about transportation home, social worker arranged it, and he is stable for discharge.  Consults:  None  Significant Diagnostic Studies:  labs: Completed and reviewed, stable  Discharge Vitals:   Blood pressure 100/63, pulse 94, temperature 97.9 F (36.6 C), temperature source Oral, resp. rate 18, height 5\' 11"  (1.803 m), weight 73.483 kg (162 lb). Body mass index is 22.59 kg/(m^2). Lab Results:   No results found for this or any previous visit (from the past 72 hour(s)).  Physical Findings: AIMS: Facial and Oral Movements Muscles of Facial Expression: None, normal Lips and Perioral Area: None, normal Jaw: None, normal Tongue: None, normal,Extremity Movements Upper (arms, wrists, hands, fingers): None, normal Lower (legs, knees, ankles, toes): None, normal, Trunk Movements Neck, shoulders, hips: None, normal, Overall Severity Severity of abnormal movements (highest score from questions above): None, normal Incapacitation due to abnormal movements: None, normal Patient's awareness of abnormal movements (rate only patient's report): No Awareness, Dental Status Current problems with teeth and/or dentures?: Yes (no teeth, does not wear dentures) Does patient usually wear dentures?: No  CIWA:    COWS:     Psychiatric Specialty Exam: See Psychiatric Specialty Exam and Suicide Risk Assessment completed by Attending Physician prior to discharge.  Discharge destination:  Home  Is patient on multiple antipsychotic therapies at discharge:  No   Has Patient had three or more failed trials of antipsychotic monotherapy by history:  No Recommended Plan for Multiple Antipsychotic Therapies:  N/A  Discharge Orders    Future Orders Please Complete By Expires   Diet - low sodium heart healthy      Activity as tolerated - No restrictions  Medication List     As of  10/24/2012  1:27 PM    TAKE these medications      Indication    gabapentin 600 MG tablet   Commonly known as: NEURONTIN   Take 1 tablet (600 mg total) by mouth 3 (three) times daily.    Indication: Neuropathic Pain      methocarbamol 500 MG tablet   Commonly known as: ROBAXIN   Take 1 tablet (500 mg total) by mouth at bedtime.    Indication: Musculoskeletal Pain      mirtazapine 7.5 MG tablet   Commonly known as: REMERON   Take 1 tablet (7.5 mg total) by mouth at bedtime.    Indication: Trouble Sleeping      OLANZapine 15 MG tablet   Commonly known as: ZYPREXA   Take 2 tablets (30 mg total) by mouth at bedtime.    Indication: Major Depressive Disorder           Follow-up Information    Follow up with [facility]. On 10/26/2012. (Please go to [facility]'s walk in clinic on Firiday, October 26, 2012 between 7:45 - 11 AM)    Contact information:   [address] [place], [place]  [address]  [phone]         Follow-up recommendations:  Activity as tolerated, low-sodium heart healthy diet  Comments:  Patient will follow-up with [facility] after discharge for his therapy and medications.  Total Discharge Time:  Greater than 30 minutes  Signed: Nanine Means, PMH-NP 10/24/2012, 1:27 PM

## 2012-10-24 NOTE — Progress Notes (Signed)
Patient denies SI/HI, denies A/V hallucinations. Patient verbalizes understanding of discharge instructions, follow up care and prescriptions. Patient given all belongings from [facility] locker. Patient escorted out by staff, transported by public transportation.  

## 2012-10-24 NOTE — Clinical Social Work Note (Signed)
[facility] LCSW Aftercare Discharge Planning Group Note       8:30-9:30 AM  1/22/20149:30 AM  Participation Quality:  Appropriate  Affect:  Appropriate  Cognitive:  Appropriate  Insight:  Engaged  Engagement in Group:  Engaged  Modes of Intervention:  Education, Exploration, Problem-solving and Support  Summary of Progress/Problems:  Patient reports doing well and being ready to discharge home today.  He denies SI/HI and rates all symptoms at zero.  Patient advised we are awaiting a call back from [facility].  [name] 10/24/2012 9:30 AM

## 2012-10-24 NOTE — Clinical Social Work Note (Signed)
Writer contacted [facility] and was advised they will not be able to transport patient due to his Medicaid not covering transportation.

## 2012-10-24 NOTE — Progress Notes (Signed)
[facility] Adult Case Management Discharge Plan :  Will you be returning to the same living situation after discharge: Yes,  Patient returning to the home he shares with his daughter. At discharge, do you have transportation home?:No.  Patient will need assistance with transportation. Do you have the ability to pay for your medications:Yes,  Patient has Medicaid.  Release of information consent forms completed and in the chart;  Patient's signature needed at discharge.  Patient to Follow up at: Follow-up Information    Follow up with [facility]. On 10/26/2012. (Please go to [facility]'s walk in clinic on Firiday, October 26, 2012 between 7:45 - 11 AM)    Contact information:   [address] [place], [place]  [address]  [phone]         Patient denies SI/HI:   Yes,  Patient is no longer endorsing SI/HI or other thoughts of self harm.    Safety Planning and Suicide Prevention discussed:  Yes,  Reviewed during aftercare group.  [name] 10/24/2012, 11:02 AM

## 2012-10-24 NOTE — Progress Notes (Signed)
[facility] INPATIENT:  Family/Significant Other Suicide Prevention Education  Suicide Prevention Education:  Education Completed; Zonia Kief, Friend, [phone] has been identified by the patient as the family member/significant other with whom the patient will be residing, and identified as the person(s) who will aid the patient in the event of a mental health crisis (suicidal ideations/suicide attempt).  With written consent from the patient, the family member/significant other has been provided the following suicide prevention education, prior to the and/or following the discharge of the patient.  The suicide prevention education provided includes the following:  Suicide risk factors  Suicide prevention and interventions  National Suicide Hotline telephone number  [facility] assessment telephone number  [facility] Emergency Assistance 911  [facility] and/or Residential Mobile Crisis Unit telephone number  Request made of family/significant other to:  Remove weapons (e.g., guns, rifles, knives), all items previously/currently identified as safety concern.  Patient has an antique one shot 22 rifle.  Patient has not allowed anyone to secure weapon and Lt. Ulice Brilliant [facility] Department advised once crisis stabilize, patient has the right to bear arms.  Remove drugs/medications (over-the-counter, prescriptions, illicit drugs), all items previously/currently identified as a safety concern.  The family member/significant other verbalizes understanding of the suicide prevention education information provided.  The family member/significant other agrees to remove the items of safety concern listed above.  [name] 10/24/2012, 1:12 PM

## 2012-10-24 NOTE — Progress Notes (Signed)
[facility] INPATIENT:  Family/Significant Other Suicide Prevention Education  Suicide Prevention Education:  Education Completed; Bruce Goodman, Daughter, [phone] has been identified by the patient as the family member/significant other with whom the patient will be residing, and identified as the person(s) who will aid the patient in the event of a mental health crisis (suicidal ideations/suicide attempt).  With written consent from the patient, the family member/significant other has been provided the following suicide prevention education, prior to the and/or following the discharge of the patient.  The suicide prevention education provided includes the following:  Suicide risk factors  Suicide prevention and interventions  National Suicide Hotline telephone number  [facility] assessment telephone number  [facility] Emergency Assistance 911  [facility] and/or Residential Mobile Crisis Unit telephone number  Request made of family/significant other to:  Remove weapons (e.g., guns, rifles, knives), all items previously/currently identified as safety concern.  Daughter advised there are no guns in the home.  Remove drugs/medications (over-the-counter, prescriptions, illicit drugs), all items previously/currently identified as a safety concern.  The family member/significant other verbalizes understanding of the suicide prevention education information provided.  The family member/significant other agrees to remove the items of safety concern listed above.  Bruce Goodman 10/24/2012, 1:05 PM

## 2012-10-24 NOTE — Tx Team (Signed)
Interdisciplinary Treatment Plan Update (Adult)  Date:  10/24/2012  Time Reviewed:  10:20 AM   Progress in Treatment: Attending groups:   Yes   Participating in groups:  Yes Taking medication as prescribed:  Yes Tolerating medication:  Yes Family/Significant othe contact made: Contact made with family Patient understands diagnosis:  Yes Discussing patient identified problems/goals with staff: Yes Medical problems stabilized or resolved: Yes Denies suicidal/homicidal ideation:Yes Issues/concerns per patient self-inventory:  Other:   New problem(s) identified:  Reason for Continuation of Hospitalization: Anxiety Depression Medication stabilization Suicidal ideation  Interventions implemented related to continuation of hospitalization:  Medication Management; safety checks q 15 mins  Additional comments:  Estimated length of stay:  2-3 days  Discharge Plan:  Home with outpatient follow up  New goal(s):  Review of initial/current patient goals per problem list:    1.  Goal(s): Eliminate SI/other thoughts of self harm   Met:  Yes  Target date: d/c  As evidenced by: Patient no longer endorses SI/HI or other thoughts of self harm.    2.  Goal (s):Reduce depression/anxiety (rated at zero today)  Met: Yes  Target date: d/c  As evidenced by: Patient rates symptoms at four or below    3.  Goal(s):.stabilize on meds   Met:  Yes  Target date: d/c  As evidenced by: Patient reports being stabilized on medications - less symptomatic    4.  Goal(s): Refer for outpatient follow up   Met:  Yes  Target date: d/c  As evidenced by: Follow up appointment scheduled    Attendees: Patient:   10/24/2012 10:20 AM  Physican:  Patrick North, MD 10/24/2012 10:20 AM  Nursing:    Harold Barban, RN 10/24/2012 10:20 AM   Nursing:   Berneice Heinrich, RN 10/24/2012 10:20 AM   Clinical Social Worker:  Juline Patch, LCSW 10/24/2012 10:20 AM   Other: Tera Helper, PHM-NP  10/24/2012 10:20 AM   Other:         10/24/2012 10:20 AM Other:        10/24/2012 10:20 AM

## 2012-10-26 NOTE — Progress Notes (Signed)
Patient Discharge Instructions:  After Visit Summary (AVS):   Faxed to:  10/26/12 Discharge Summary Note:   Faxed to:  10/26/12 Psychiatric Admission Assessment Note:   Faxed to:  10/26/12 Suicide Risk Assessment - Discharge Assessment:   Faxed to:  10/26/12 Faxed/Sent to the Next Level Care provider:  10/26/12 Faxed to [facility] @ [phone]  Jerelene Redden, 10/26/2012, 4:03 PM

## 2012-10-29 NOTE — Discharge Summary (Signed)
Reviewed

## 2012-11-04 ENCOUNTER — Emergency Department (HOSPITAL_COMMUNITY): Payer: Medicare Other

## 2012-11-04 ENCOUNTER — Emergency Department (HOSPITAL_COMMUNITY)
Admission: EM | Admit: 2012-11-04 | Discharge: 2012-11-05 | Disposition: A | Discharge status: Other Acute Inpatient Hospital | Payer: Medicare Other | Attending: Emergency Medicine | Admitting: Emergency Medicine

## 2012-11-04 ENCOUNTER — Encounter (HOSPITAL_COMMUNITY): Payer: Self-pay | Admitting: Emergency Medicine

## 2012-11-04 DIAGNOSIS — R45851 Suicidal ideations: Secondary | ICD-10-CM | POA: Insufficient documentation

## 2012-11-04 DIAGNOSIS — R251 Tremor, unspecified: Secondary | ICD-10-CM

## 2012-11-04 DIAGNOSIS — Y939 Activity, unspecified: Secondary | ICD-10-CM | POA: Insufficient documentation

## 2012-11-04 DIAGNOSIS — R259 Unspecified abnormal involuntary movements: Secondary | ICD-10-CM | POA: Diagnosis not present

## 2012-11-04 DIAGNOSIS — S298XXA Other specified injuries of thorax, initial encounter: Secondary | ICD-10-CM | POA: Diagnosis not present

## 2012-11-04 DIAGNOSIS — Y929 Unspecified place or not applicable: Secondary | ICD-10-CM | POA: Insufficient documentation

## 2012-11-04 DIAGNOSIS — F319 Bipolar disorder, unspecified: Secondary | ICD-10-CM | POA: Diagnosis not present

## 2012-11-04 DIAGNOSIS — R42 Dizziness and giddiness: Secondary | ICD-10-CM | POA: Diagnosis not present

## 2012-11-04 DIAGNOSIS — F209 Schizophrenia, unspecified: Secondary | ICD-10-CM | POA: Insufficient documentation

## 2012-11-04 DIAGNOSIS — Z79899 Other long term (current) drug therapy: Secondary | ICD-10-CM | POA: Insufficient documentation

## 2012-11-04 DIAGNOSIS — F3289 Other specified depressive episodes: Secondary | ICD-10-CM | POA: Insufficient documentation

## 2012-11-04 DIAGNOSIS — W1809XA Striking against other object with subsequent fall, initial encounter: Secondary | ICD-10-CM | POA: Insufficient documentation

## 2012-11-04 DIAGNOSIS — Z87891 Personal history of nicotine dependence: Secondary | ICD-10-CM | POA: Insufficient documentation

## 2012-11-04 DIAGNOSIS — F329 Major depressive disorder, single episode, unspecified: Secondary | ICD-10-CM | POA: Diagnosis not present

## 2012-11-04 DIAGNOSIS — F32A Depression, unspecified: Secondary | ICD-10-CM

## 2012-11-04 LAB — COMPREHENSIVE METABOLIC PANEL
ALT: 8 U/L (ref 0–53)
AST: 12 U/L (ref 0–37)
Albumin: 3.8 g/dL (ref 3.5–5.2)
Alkaline Phosphatase: 55 U/L (ref 39–117)
BUN: 8 mg/dL (ref 6–23)
CO2: 29 mEq/L (ref 19–32)
Calcium: 9.2 mg/dL (ref 8.4–10.5)
Chloride: 103 mEq/L (ref 96–112)
Creatinine, Ser: 0.87 mg/dL (ref 0.50–1.35)
GFR calc Af Amer: >90 mL/min (ref >90)
GFR calc non Af Amer: >90 mL/min (ref >90)
Glucose, Bld: 102 mg/dL — ABNORMAL HIGH (ref 70–99)
Potassium: 3.2 mEq/L — ABNORMAL LOW (ref 3.5–5.1)
Sodium: 140 mEq/L (ref 135–145)
Total Bilirubin: 0.7 mg/dL (ref 0.3–1.2)
Total Protein: 7 g/dL (ref 6.0–8.3)

## 2012-11-04 LAB — CBC WITH DIFFERENTIAL/PLATELET
Basophils Absolute: 0 10*3/uL (ref 0.0–0.1)
Basophils Relative: 1 % (ref 0–1)
Eosinophils Absolute: 0.1 10*3/uL (ref 0.0–0.7)
Eosinophils Relative: 2 % (ref 0–5)
HCT: 41.4 % (ref 39.0–52.0)
Hemoglobin: 14.3 g/dL (ref 13.0–17.0)
Lymphocytes Relative: 34 % (ref 12–46)
Lymphs Abs: 1.9 10*3/uL (ref 0.7–4.0)
MCH: 31.8 pg (ref 26.0–34.0)
MCHC: 34.5 g/dL (ref 30.0–36.0)
MCV: 92.2 fL (ref 78.0–100.0)
Monocytes Absolute: 0.4 10*3/uL (ref 0.1–1.0)
Monocytes Relative: 7 % (ref 3–12)
Neutro Abs: 3.1 10*3/uL (ref 1.7–7.7)
Neutrophils Relative %: 56 % (ref 43–77)
Platelets: 217 10*3/uL (ref 150–400)
RBC: 4.49 MIL/uL (ref 4.22–5.81)
RDW: 13.3 % (ref 11.5–15.5)
WBC: 5.4 10*3/uL (ref 4.0–10.5)

## 2012-11-04 LAB — RAPID URINE DRUG SCREEN, HOSP PERFORMED
Amphetamines: NOT DETECTED
Barbiturates: NOT DETECTED
Benzodiazepines: NOT DETECTED
Cocaine: NOT DETECTED
Opiates: NOT DETECTED
Tetrahydrocannabinol: POSITIVE — AB

## 2012-11-04 LAB — MAGNESIUM: Magnesium: 1.9 mg/dL (ref 1.5–2.5)

## 2012-11-04 LAB — ETHANOL: Alcohol, Ethyl (B): <11 mg/dL (ref 0–11)

## 2012-11-04 MED ORDER — ACETAMINOPHEN 325 MG PO TABS: 650.0000 mg | ORAL_TABLET | ORAL | Status: DC | PRN

## 2012-11-04 MED ORDER — OLANZAPINE 5 MG PO TABS
30.0000 mg | ORAL_TABLET | Freq: Every day | ORAL | Status: DC
  Administered 2012-11-04: 30 mg via ORAL
  Filled 2012-11-04 (×2): qty 3

## 2012-11-04 MED ORDER — OLANZAPINE 5 MG PO TABS
ORAL_TABLET | ORAL | Status: AC
  Filled 2012-11-04: qty 5

## 2012-11-04 MED ORDER — ZOLPIDEM TARTRATE 5 MG PO TABS: 10.0000 mg | ORAL_TABLET | Freq: Every evening | ORAL | Status: DC | PRN

## 2012-11-04 MED ORDER — GABAPENTIN 300 MG PO CAPS
ORAL_CAPSULE | ORAL | Status: AC
  Filled 2012-11-04: qty 1

## 2012-11-04 MED ORDER — GABAPENTIN 600 MG PO TABS
300.0000 mg | ORAL_TABLET | Freq: Three times a day (TID) | ORAL | Status: DC
  Administered 2012-11-04: 300 mg via ORAL
  Filled 2012-11-04 (×5): qty 0.5

## 2012-11-04 MED ORDER — ALUM & MAG HYDROXIDE-SIMETH 200-200-20 MG/5ML PO SUSP: 30.0000 mL | ORAL | Status: DC | PRN

## 2012-11-04 MED ORDER — MIRTAZAPINE 30 MG PO TBDP
ORAL_TABLET | ORAL | Status: AC
  Filled 2012-11-04: qty 1

## 2012-11-04 MED ORDER — ONDANSETRON HCL 4 MG PO TABS: 4.0000 mg | ORAL_TABLET | Freq: Three times a day (TID) | ORAL | Status: DC | PRN

## 2012-11-04 MED ORDER — POTASSIUM CHLORIDE CRYS ER 20 MEQ PO TBCR
40.0000 meq | EXTENDED_RELEASE_TABLET | Freq: Three times a day (TID) | ORAL | Status: DC
  Administered 2012-11-04 – 2012-11-05 (×2): 40 meq via ORAL
  Filled 2012-11-04 (×2): qty 2

## 2012-11-04 MED ORDER — MIRTAZAPINE 30 MG PO TABS
30.0000 mg | ORAL_TABLET | Freq: Every day | ORAL | Status: DC
  Administered 2012-11-04: 30 mg via ORAL
  Filled 2012-11-04 (×2): qty 1

## 2012-11-04 MED ORDER — NICOTINE 21 MG/24HR TD PT24: 21.0000 mg | MEDICATED_PATCH | Freq: Every day | TRANSDERMAL | Status: DC

## 2012-11-04 MED ORDER — OLANZAPINE 5 MG PO TABS
ORAL_TABLET | ORAL | Status: AC
  Filled 2012-11-04: qty 1

## 2012-11-04 MED ORDER — LORAZEPAM 1 MG PO TABS: 1.0000 mg | ORAL_TABLET | Freq: Three times a day (TID) | ORAL | Status: DC | PRN

## 2012-11-04 MED ORDER — IBUPROFEN 400 MG PO TABS: 600.0000 mg | ORAL_TABLET | Freq: Three times a day (TID) | ORAL | Status: DC | PRN

## 2012-11-04 NOTE — ED Notes (Signed)
Tele-Psych Consult evaluation notes given to MD

## 2012-11-04 NOTE — ED Notes (Signed)
Graham crackers, Peanut butter and H20 given at patients request for a snack.

## 2012-11-04 NOTE — ED Notes (Addendum)
Pt reports episodes of "shaking and falling" over the past several weeks. Pt states he had 3 episodes of shaking today  and 1 episode of shaking, passing out and falling. Pt nephew states pt appeared to have seizure.  Pt also reports being suicidal-states he wants to end it because he's tired of things happening to him. Pt's plan is to step out in front of a car.
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
## 2012-11-05 DIAGNOSIS — Z87891 Personal history of nicotine dependence: Secondary | ICD-10-CM | POA: Diagnosis not present

## 2012-11-05 DIAGNOSIS — R259 Unspecified abnormal involuntary movements: Secondary | ICD-10-CM | POA: Diagnosis not present

## 2012-11-05 DIAGNOSIS — F319 Bipolar disorder, unspecified: Secondary | ICD-10-CM | POA: Diagnosis not present

## 2012-11-05 DIAGNOSIS — R45851 Suicidal ideations: Secondary | ICD-10-CM | POA: Diagnosis not present

## 2012-11-05 DIAGNOSIS — F209 Schizophrenia, unspecified: Secondary | ICD-10-CM | POA: Diagnosis not present

## 2012-11-05 DIAGNOSIS — Z79899 Other long term (current) drug therapy: Secondary | ICD-10-CM | POA: Diagnosis not present

## 2012-11-05 DIAGNOSIS — F329 Major depressive disorder, single episode, unspecified: Secondary | ICD-10-CM | POA: Diagnosis not present

## 2012-11-05 MED ORDER — GABAPENTIN 300 MG PO CAPS
300.0000 mg | ORAL_CAPSULE | Freq: Three times a day (TID) | ORAL | Status: DC
  Administered 2012-11-05: 300 mg via ORAL
  Filled 2012-11-05 (×4): qty 1

## 2012-11-05 NOTE — ED Notes (Signed)
Patient given Sprite to drink. Awaiting breakfast.

## 2012-11-05 NOTE — ED Notes (Signed)
Patient accepted to [facility] by Dr Forrestine Him. Dr Estell Harpin made aware and Tommy with Act Team made aware.

## 2012-11-05 NOTE — ED Notes (Signed)
Report given to [facility]. Awaiting arrival.

## 2012-11-05 NOTE — Progress Notes (Signed)
0981 Patient is awaiting placement. No c/o

## 2012-11-05 NOTE — ED Notes (Signed)
Bruce Goodman with [name] called about referral. Appropriate information provided. Awaiting disposition from [name]  MD.

## 2012-11-05 NOTE — BH Assessment (Signed)
Assessment Note   Bruce Goodman is an 57 y.o. male. The patient comes to the ED with complaints of increased depression and thoughts of suicide. He is thinking of stepping onto train tracks or into traffic. At this time he can not contract for safety. He is not homicidal and has no history of violence. He is not hallucinated but he is delusional. He is paranoid , thinking his family is mistreating(verbally ) him. He does not feel safe at home, but he does feel safe in the hospital. He is taking his medications as prescribed. He continues to be anxious and depressed. His plan after discharge is to move to an apartment setting in [place]. Discussed with Dr Estell Harpin, patient will be referred to [facility] and to [facility].  Axis I: Schizoaffective Disorder Axis II: Deferred Axis III:  Past Medical History  Diagnosis Date  . Bipolar 1 disorder   . Depression   . Schizophrenia    Axis IV: housing problems, other psychosocial or environmental problems, problems with access to health care services and problems with primary support group Axis V: 21-30 behavior considerably influenced by delusions or hallucinations OR serious impairment in judgment, communication OR inability to function in almost all areas  Past Medical History:  Past Medical History  Diagnosis Date  . Bipolar 1 disorder   . Depression   . Schizophrenia     Past Surgical History  Procedure Date  . Wrist surgery     right  . No past surgeries     rt wrist surg and carpal tunnel syndrome     Family History: History reviewed. No pertinent family history.  Social History:  reports that he has quit smoking. He does not have any smokeless tobacco history on file. He reports that he uses illicit drugs (Marijuana). He reports that he does not drink alcohol.  Additional Social History:     CIWA: CIWA-Ar BP: 110/74 mmHg Pulse Rate: 73  COWS:    Allergies:  Allergies  Allergen Reactions  . Haldol (Haloperidol Decanoate)      REACTION: Alters mental status  . Seroquel (Quetiapine Fumerate)     REACTION: UNKNOWN REACTION    Home Medications:  (Not in a hospital admission)  OB/GYN Status:  No LMP for male patient.  General Assessment Data Location of Assessment: [facility] ACT Assessment: Yes Living Arrangements: Children Can pt return to current living arrangement?: Yes (patient plans to move to an apartment after discharge) Admission Status: Voluntary Is patient capable of signing voluntary admission?: Yes Transfer from: Acute Hospital Referral Source: MD  Education Status Is patient currently in school?: No  Risk to self Suicidal Ideation: No-Not Currently/Within Last 6 Months Suicidal Intent: No-Not Currently/Within Last 6 Months Is patient at risk for suicide?: Yes Suicidal Plan?: No-Not Currently/Within Last 6 Months Specify Current Suicidal Plan: step onto train tracks or into traqffric Access to Means: Yes What has been your use of drugs/alcohol within the last 12 months?: occassional marijuana Previous Attempts/Gestures: No (verbal threats to harm self) How many times?: 0  Other Self Harm Risks: denies Triggers for Past Attempts: Other personal contacts Intentional Self Injurious Behavior: None Family Suicide History: No Recent stressful life event(s): Conflict (Comment);Turmoil (Comment) (according to patient he is msistreated at home) Persecutory voices/beliefs?: No Depression: Yes Depression Symptoms: Insomnia;Isolating;Feeling worthless/self pity Substance abuse history and/or treatment for substance abuse?: Yes (occassional marijuana) Suicide prevention information given to non-admitted patients: Not applicable  Risk to Others Homicidal Ideation: No Thoughts of Harm  to Others: No Current Homicidal Intent: No Current Homicidal Plan: No Access to Homicidal Means: No History of harm to others?: No Assessment of Violence: None Noted Does patient have access to weapons?:  No Criminal Charges Pending?: No Does patient have a court date: No  Psychosis Hallucinations: None noted Delusions: Unspecified (paranoid thoughts )  Mental Status Report Appear/Hygiene: Improved Eye Contact: Fair Motor Activity: Freedom of movement;Restlessness Speech: Logical/coherent Level of Consciousness: Alert Mood: Depressed;Suspicious Affect: Appropriate to circumstance Anxiety Level: Minimal Thought Processes: Coherent;Circumstantial Judgement: Unimpaired Orientation: Person;Place;Time;Situation Obsessive Compulsive Thoughts/Behaviors: Moderate  Cognitive Functioning Concentration: Decreased Memory: Recent Intact;Remote Intact IQ: Average Insight: Fair Impulse Control: Fair Appetite: Fair Weight Loss: 0  Weight Gain: 0  Sleep: No Change Vegetative Symptoms: None  ADLScreening [facility] Assessment Services) Patient's cognitive ability adequate to safely complete daily activities?: Yes Patient able to express need for assistance with ADLs?: Yes Independently performs ADLs?: Yes (appropriate for developmental age)  Abuse/Neglect [facility]) Physical Abuse: Denies Verbal Abuse: Denies Sexual Abuse: Denies  Prior Inpatient Therapy Prior Inpatient Therapy: Yes Prior Therapy Dates: December 2013 Prior Therapy Facilty/Provider(s): [facility], [place], [place] Reason for Treatment: Depression  Prior Outpatient Therapy Prior Outpatient Therapy: Yes Prior Therapy Dates: current Prior [facility]): [facility] Reason for Treatment: depression  ADL Screening (condition at time of admission) Patient's cognitive ability adequate to safely complete daily activities?: Yes Patient able to express need for assistance with ADLs?: Yes Independently performs ADLs?: Yes (appropriate for developmental age)       Abuse/Neglect Assessment (Assessment to be complete while patient is alone) Physical Abuse: Denies Verbal Abuse: Denies Sexual Abuse: Denies Values /  Beliefs Cultural Requests During Hospitalization: None Spiritual Requests During Hospitalization: None     Nutrition Screen- [facility] Adult/[facility] Patient's home diet: Regular (Breakfast tray provided)  Additional Information 1:1 In Past 12 Months?: No CIRT Risk: Yes Elopement Risk: Yes Does patient have medical clearance?: Yes     Disposition: Reffered to [facility] and to [facility]. Disposition Disposition of Patient: Inpatient treatment program Type of inpatient treatment program: Adult  On Site Evaluation by:   Reviewed with Physician:     Jearld Pies 11/05/2012 10:30 AM

## 2012-11-05 NOTE — ED Notes (Signed)
[facility] at bedside for patient transport. 

## 2012-11-05 NOTE — ED Notes (Signed)
[facility] called for transport to [facility].

## 2012-11-05 NOTE — ED Notes (Signed)
Patient left ED at this time with [facility]. 

## 2012-11-05 NOTE — Plan of Care (Signed)
DAYMEIN NUNNERY   [id number]   1956-06-19   Mr. Kann is presented for admission to [facility] for increasing thoughts of depression and inability to contract for safety outside of the hospital.He does not have a plan.  Chart Review:  3 admissions to [facility] in the last 6 months. 4 admissions in the last year.  Past Medical History: Schizoaffective disorder Bipolar disorder Cannabis Abuse  Patient is declined for admission to [facility] as he will need a higher level of care.    Recommend ALF possibly, or [facility] for long term care.  Rona Ravens. Danylah Holden [facility] 11/05/2012 11:26 AM

## 2012-11-05 NOTE — ED Notes (Signed)
Report given to Roxan Hockey, Charity fundraiser at [facility]. During report, nurse kept interrupting. She was questioning limited medical history on patient, this RN stated that patient was a poor historian and that we got the best history we could. She then hung up phone. Awaiting [facility] at this time for transportation to facility

## 2012-11-05 NOTE — ED Notes (Signed)
Patient lying on left side, NAD noted.

## 2012-11-06 DIAGNOSIS — M542 Cervicalgia: Secondary | ICD-10-CM | POA: Diagnosis not present

## 2012-11-06 DIAGNOSIS — R51 Headache: Secondary | ICD-10-CM | POA: Diagnosis not present

## 2012-11-23 DIAGNOSIS — F3162 Bipolar disorder, current episode mixed, moderate: Secondary | ICD-10-CM | POA: Diagnosis not present

## 2012-11-27 DIAGNOSIS — F3162 Bipolar disorder, current episode mixed, moderate: Secondary | ICD-10-CM | POA: Diagnosis not present

## 2012-12-20 ENCOUNTER — Encounter (HOSPITAL_COMMUNITY): Payer: Self-pay | Admitting: *Deleted

## 2012-12-20 ENCOUNTER — Emergency Department (HOSPITAL_COMMUNITY)
Admission: EM | Admit: 2012-12-20 | Discharge: 2012-12-20 | Disposition: A | Discharge status: Home / Self Care | Payer: Medicare Other | Attending: Emergency Medicine | Admitting: Emergency Medicine

## 2012-12-20 DIAGNOSIS — Z79899 Other long term (current) drug therapy: Secondary | ICD-10-CM | POA: Diagnosis not present

## 2012-12-20 DIAGNOSIS — F209 Schizophrenia, unspecified: Secondary | ICD-10-CM | POA: Insufficient documentation

## 2012-12-20 DIAGNOSIS — Z87891 Personal history of nicotine dependence: Secondary | ICD-10-CM | POA: Diagnosis not present

## 2012-12-20 DIAGNOSIS — F319 Bipolar disorder, unspecified: Secondary | ICD-10-CM | POA: Diagnosis not present

## 2012-12-20 DIAGNOSIS — R45851 Suicidal ideations: Secondary | ICD-10-CM | POA: Insufficient documentation

## 2012-12-20 LAB — URINALYSIS, ROUTINE W REFLEX MICROSCOPIC
Bilirubin Urine: NEGATIVE
Glucose, UA: NEGATIVE mg/dL
Hgb urine dipstick: NEGATIVE
Ketones, ur: NEGATIVE mg/dL
Leukocytes, UA: NEGATIVE
Nitrite: NEGATIVE
Protein, ur: NEGATIVE mg/dL
Specific Gravity, Urine: >1.03 — ABNORMAL HIGH (ref 1.005–1.030)
Urobilinogen, UA: 1 mg/dL (ref 0.0–1.0)
pH: 6 (ref 5.0–8.0)

## 2012-12-20 LAB — CBC WITH DIFFERENTIAL/PLATELET
Basophils Absolute: 0.1 10*3/uL (ref 0.0–0.1)
Basophils Relative: 1 % (ref 0–1)
Eosinophils Absolute: 0.3 10*3/uL (ref 0.0–0.7)
Eosinophils Relative: 4 % (ref 0–5)
HCT: 45 % (ref 39.0–52.0)
Hemoglobin: 15 g/dL (ref 13.0–17.0)
Lymphocytes Relative: 22 % (ref 12–46)
Lymphs Abs: 1.6 10*3/uL (ref 0.7–4.0)
MCH: 31 pg (ref 26.0–34.0)
MCHC: 33.3 g/dL (ref 30.0–36.0)
MCV: 93 fL (ref 78.0–100.0)
Monocytes Absolute: 0.5 10*3/uL (ref 0.1–1.0)
Monocytes Relative: 7 % (ref 3–12)
Neutro Abs: 4.7 10*3/uL (ref 1.7–7.7)
Neutrophils Relative %: 66 % (ref 43–77)
Platelets: 190 10*3/uL (ref 150–400)
RBC: 4.84 MIL/uL (ref 4.22–5.81)
RDW: 13.6 % (ref 11.5–15.5)
WBC: 7.2 10*3/uL (ref 4.0–10.5)

## 2012-12-20 LAB — RAPID URINE DRUG SCREEN, HOSP PERFORMED
Amphetamines: NOT DETECTED
Barbiturates: NOT DETECTED
Benzodiazepines: NOT DETECTED
Cocaine: NOT DETECTED
Opiates: NOT DETECTED
Tetrahydrocannabinol: POSITIVE — AB

## 2012-12-20 LAB — BASIC METABOLIC PANEL
BUN: 12 mg/dL (ref 6–23)
CO2: 31 mEq/L (ref 19–32)
Calcium: 9.5 mg/dL (ref 8.4–10.5)
Chloride: 100 mEq/L (ref 96–112)
Creatinine, Ser: 1.01 mg/dL (ref 0.50–1.35)
GFR calc Af Amer: >90 mL/min (ref >90)
GFR calc non Af Amer: 81 mL/min — ABNORMAL LOW (ref >90)
Glucose, Bld: 100 mg/dL — ABNORMAL HIGH (ref 70–99)
Potassium: 3.8 mEq/L (ref 3.5–5.1)
Sodium: 139 mEq/L (ref 135–145)

## 2012-12-20 LAB — ETHANOL: Alcohol, Ethyl (B): <11 mg/dL (ref 0–11)

## 2012-12-20 NOTE — BH Assessment (Signed)
Assessment Note   Bruce Goodman is an 57 y.o. male.   Presents to the ED because he is tired of being in one room at the [facility] and believes that he might cut his wrist to die.  He states that he would like to go to some classes. Affect blunted, mood depressed, behavior appropriate to the situation. Deneis HI, does not appear psychotic or delusional.  Will submit to [facility] for admission.   Axis I: Depressive Disorder NOS Axis II: Deferred Axis III:  Past Medical History  Diagnosis Date  . Bipolar 1 disorder   . Depression   . Schizophrenia    Axis IV: economic problems and other psychosocial or environmental problems Axis V: 51-60 moderate symptoms  Past Medical History:  Past Medical History  Diagnosis Date  . Bipolar 1 disorder   . Depression   . Schizophrenia     Past Surgical History  Procedure Laterality Date  . Wrist surgery      right  . No past surgeries      rt wrist surg and carpal tunnel syndrome     Family History: History reviewed. No pertinent family history.  Social History:  reports that he has quit smoking. He does not have any smokeless tobacco history on file. He reports that he uses illicit drugs (Marijuana). He reports that he does not drink alcohol.  Additional Social History:     CIWA: CIWA-Ar BP: 106/72 mmHg Pulse Rate: 84 COWS:    Allergies:  Allergies  Allergen Reactions  . Haldol (Haloperidol Decanoate)     REACTION: Alters mental status  . Seroquel (Quetiapine Fumerate)     REACTION: UNKNOWN REACTION    Home Medications:  (Not in a hospital admission)  OB/GYN Status:  No LMP for male patient.  General Assessment Data Location of Assessment: [facility] ACT Assessment: Yes Living Arrangements: Children Can pt return to current living arrangement?: Yes (lives at the [facility]) Admission Status: Voluntary Is patient capable of signing voluntary admission?: Yes Transfer from: Home Referral Source:  Self/Family/Friend  Education Status Is patient currently in school?: No  Risk to self Suicidal Ideation: Yes-Currently Present Suicidal Intent: Yes-Currently Present Is patient at risk for suicide?: Yes Suicidal Plan?: Yes-Currently Present Specify Current Suicidal Plan: cut wrist Access to Means: Yes Specify Access to Suicidal Means: knive What has been your use of drugs/alcohol within the last 12 months?: denies Previous Attempts/Gestures: No (verbal threats to harm self) How many times?: 0 Other Self Harm Risks: enies Triggers for Past Attempts: Other personal contacts Intentional Self Injurious Behavior: None Family Suicide History: No Recent stressful life event(s): Other (Comment) (loneliness) Persecutory voices/beliefs?: No Depression: Yes Depression Symptoms: Despondent Substance abuse history and/or treatment for substance abuse?: No Suicide prevention information given to non-admitted patients: Not applicable  Risk to Others Homicidal Ideation: No Thoughts of Harm to Others: No Current Homicidal Intent: No Current Homicidal Plan: No Access to Homicidal Means: No History of harm to others?: No Assessment of Violence: None Noted Does patient have access to weapons?: No Criminal Charges Pending?: No Does patient have a court date: No  Psychosis Hallucinations: None noted Delusions: None noted  Mental Status Report Appear/Hygiene: Other (Comment) (appropriate) Eye Contact: Fair Motor Activity: Freedom of movement Speech: Logical/coherent Level of Consciousness: Alert Mood: Depressed Affect: Appropriate to circumstance Anxiety Level: Minimal Thought Processes: Coherent Judgement: Unimpaired Orientation: Person;Place;Time;Situation Obsessive Compulsive Thoughts/Behaviors: Moderate  Cognitive Functioning Concentration: Decreased Memory: Recent Intact;Remote Intact Insight: Fair Impulse Control: Fair  Appetite: Fair Weight Loss: 0 Weight Gain:  0 Sleep: No Change Total Hours of Sleep: 7 Vegetative Symptoms: None  ADLScreening [facility] Assessment Services) Patient's cognitive ability adequate to safely complete daily activities?: Yes Patient able to express need for assistance with ADLs?: Yes Independently performs ADLs?: Yes (appropriate for developmental age)  Abuse/Neglect [facility]) Physical Abuse: Denies Verbal Abuse: Denies Sexual Abuse: Denies  Prior Inpatient Therapy Prior Inpatient Therapy: Yes Prior Therapy Dates: Feburary Prior Therapy Facilty/Provider(s): [facility] Reason for Treatment: Depression  Prior Outpatient Therapy Prior Outpatient Therapy: Yes Prior Therapy Dates: current Prior [facility]): [facility] Reason for Treatment: depression  ADL Screening (condition at time of admission) Patient's cognitive ability adequate to safely complete daily activities?: Yes Patient able to express need for assistance with ADLs?: Yes Independently performs ADLs?: Yes (appropriate for developmental age)       Abuse/Neglect Assessment (Assessment to be complete while patient is alone) Physical Abuse: Denies Verbal Abuse: Denies Sexual Abuse: Denies Values / Beliefs Cultural Requests During Hospitalization: None Spiritual Requests During Hospitalization: None        Additional Information 1:1 In Past 12 Months?: No CIRT Risk: No Elopement Risk: No Does patient have medical clearance?: Yes     Disposition:  Disposition Disposition of Patient: Inpatient treatment program Type of inpatient treatment program: Adult  On Site Evaluation by:   Reviewed with Physician:     Genia Del 12/20/2012 4:04 PM

## 2012-12-20 NOTE — Treatment Plan (Signed)
Pt is declined for admission at [facility] by Serena Colonel, NP because pt's SI is based on his living arrangement which [facility] will not be able to help him with. Please refer pt to SW to assist with housing.

## 2012-12-20 NOTE — ED Notes (Signed)
"  Contemplating doing away with myself"  Goes to Mental Health. Says he thought of cutting his wrists.

## 2012-12-20 NOTE — ED Provider Notes (Signed)
History     This chart was scribed for Bruce Jakes, MD, MD by Smitty Pluck, ED Scribe. The patient was seen in room [facility]8/[facility] and the patient's care was started at 12:17 PM.   CSN: [id number]  Arrival date & time 12/20/12  1056    No chief complaint on file.   The history is provided by the patient. No language interpreter was used.   Bruce Goodman is a 57 y.o. male who presents to the Emergency Department complaining of SI onset 1 week ago. Pt reports that he plans to cut his wrist. He denies HI, auditory hallucinations, visual hallucinations, fever, chills, nausea, vomiting, diarrhea, weakness, cough, SOB and any other pain.  No PCP   Past Medical History  Diagnosis Date  . Bipolar 1 disorder   . Depression   . Schizophrenia     Past Surgical History  Procedure Laterality Date  . Wrist surgery      right  . No past surgeries      rt wrist surg and carpal tunnel syndrome     History reviewed. No pertinent family history.  History  Substance Use Topics  . Smoking status: Former Games developer  . Smokeless tobacco: Not on file  . Alcohol Use: No      Review of Systems  Constitutional: Negative for fever and chills.  HENT: Negative for congestion and rhinorrhea.   Respiratory: Negative for cough and shortness of breath.   Cardiovascular: Negative for chest pain.  Gastrointestinal: Negative for nausea, vomiting, abdominal pain and diarrhea.  Genitourinary: Negative for dysuria.  Musculoskeletal: Negative for back pain.  Skin: Negative for rash.  Neurological: Negative for weakness and headaches.  Psychiatric/Behavioral: Positive for suicidal ideas. Negative for confusion.  All other systems reviewed and are negative.    Allergies  Haldol and Seroquel  Home Medications   Current Outpatient Rx  Name  Route  Sig  Dispense  Refill  . gabapentin (NEURONTIN) 600 MG tablet   Oral   Take 1 tablet (600 mg total) by mouth 3 (three) times daily.   90  tablet   0   . methocarbamol (ROBAXIN) 500 MG tablet   Oral   Take 1 tablet (500 mg total) by mouth at bedtime.   30 tablet   0   . mirtazapine (REMERON) 7.5 MG tablet   Oral   Take 1 tablet (7.5 mg total) by mouth at bedtime.   30 tablet   0   . OLANZapine (ZYPREXA) 15 MG tablet   Oral   Take 2 tablets (30 mg total) by mouth at bedtime.   60 tablet   0   . traZODone (DESYREL) 100 MG tablet   Oral   Take 200 mg by mouth at bedtime.           BP 124/76  Pulse 84  Temp(Src) 97.8 F (36.6 C) (Oral)  Resp 18  Ht 5\' 11"  (1.803 m)  Wt 172 lb (78.019 kg)  BMI 24 kg/m2  SpO2 98%  Physical Exam  Nursing note and vitals reviewed. Constitutional: He is oriented to person, place, and time. He appears well-developed and well-nourished. No distress.  HENT:  Head: Normocephalic and atraumatic.  Eyes: Conjunctivae and EOM are normal. Pupils are equal, round, and reactive to light.  Neck: Normal range of motion. Neck supple. No tracheal deviation present.  Cardiovascular: Normal rate, regular rhythm and normal heart sounds.   Pulmonary/Chest: Effort normal and breath sounds normal. No respiratory distress. He  has no wheezes.  Abdominal: Soft. Bowel sounds are normal. He exhibits no distension. There is no tenderness. There is no rebound and no guarding.  Musculoskeletal: Normal range of motion. He exhibits no edema.  Neurological: He is alert and oriented to person, place, and time.  Skin: Skin is warm and dry.  Psychiatric: He has a normal mood and affect. His behavior is normal.    ED Course  Procedures (including critical care time) DIAGNOSTIC STUDIES:   COORDINATION OF CARE: 12:20 PM Discussed ED treatment with pt and pt agrees.     Labs Reviewed  URINALYSIS, ROUTINE W REFLEX MICROSCOPIC - Abnormal; Notable for the following:    Specific Gravity, Urine >1.030 (*)    All other components within normal limits  URINE RAPID DRUG SCREEN (HOSP PERFORMED) - Abnormal;  Notable for the following:    Tetrahydrocannabinol POSITIVE (*)    All other components within normal limits  BASIC METABOLIC PANEL - Abnormal; Notable for the following:    Glucose, Bld 100 (*)    GFR calc non Af Amer 81 (*)    All other components within normal limits  ETHANOL  CBC WITH DIFFERENTIAL   No results found. Results for orders placed during the hospital encounter of 12/20/12  URINALYSIS, ROUTINE W REFLEX MICROSCOPIC      Result Value Range   Color, Urine YELLOW  YELLOW   APPearance CLEAR  CLEAR   Specific Gravity, Urine >1.030 (*) 1.005 - 1.030   pH 6.0  5.0 - 8.0   Glucose, UA NEGATIVE  NEGATIVE mg/dL   Hgb urine dipstick NEGATIVE  NEGATIVE   Bilirubin Urine NEGATIVE  NEGATIVE   Ketones, ur NEGATIVE  NEGATIVE mg/dL   Protein, ur NEGATIVE  NEGATIVE mg/dL   Urobilinogen, UA 1.0  0.0 - 1.0 mg/dL   Nitrite NEGATIVE  NEGATIVE   Leukocytes, UA NEGATIVE  NEGATIVE  URINE RAPID DRUG SCREEN (HOSP PERFORMED)      Result Value Range   Opiates NONE DETECTED  NONE DETECTED   Cocaine NONE DETECTED  NONE DETECTED   Benzodiazepines NONE DETECTED  NONE DETECTED   Amphetamines NONE DETECTED  NONE DETECTED   Tetrahydrocannabinol POSITIVE (*) NONE DETECTED   Barbiturates NONE DETECTED  NONE DETECTED  ETHANOL      Result Value Range   Alcohol, Ethyl (B) <11  0 - 11 mg/dL  CBC WITH DIFFERENTIAL      Result Value Range   WBC 7.2  4.0 - 10.5 K/uL   RBC 4.84  4.22 - 5.81 MIL/uL   Hemoglobin 15.0  13.0 - 17.0 g/dL   HCT 16.1  09.6 - 04.5 %   MCV 93.0  78.0 - 100.0 fL   MCH 31.0  26.0 - 34.0 pg   MCHC 33.3  30.0 - 36.0 g/dL   RDW 40.9  81.1 - 91.4 %   Platelets 190  150 - 400 K/uL   Neutrophils Relative 66  43 - 77 %   Neutro Abs 4.7  1.7 - 7.7 K/uL   Lymphocytes Relative 22  12 - 46 %   Lymphs Abs 1.6  0.7 - 4.0 K/uL   Monocytes Relative 7  3 - 12 %   Monocytes Absolute 0.5  0.1 - 1.0 K/uL   Eosinophils Relative 4  0 - 5 %   Eosinophils Absolute 0.3  0.0 - 0.7 K/uL    Basophils Relative 1  0 - 1 %   Basophils Absolute 0.1  0.0 - 0.1 K/uL  BASIC METABOLIC PANEL      Result Value Range   Sodium 139  135 - 145 mEq/L   Potassium 3.8  3.5 - 5.1 mEq/L   Chloride 100  96 - 112 mEq/L   CO2 31  19 - 32 mEq/L   Glucose, Bld 100 (*) 70 - 99 mg/dL   BUN 12  6 - 23 mg/dL   Creatinine, Ser 1.61  0.50 - 1.35 mg/dL   Calcium 9.5  8.4 - 09.6 mg/dL   GFR calc non Af Amer 81 (*) >90 mL/min   GFR calc Af Amer >90  >90 mL/min     1. Suicidal ideation       MDM  Medically cleared. Patient with distinct suicidal thoughts and plans. Will be evaluated by behavioral health team for probable placement. Patient is currently voluntary.    I personally performed the services described in this documentation, which was scribed in my presence. The recorded information has been reviewed and is accurate.       Bruce Jakes, MD 12/20/12 [phone]

## 2012-12-20 NOTE — ED Provider Notes (Signed)
Patient seen primarily by Dr. Deretha Emory. He had presented earlier stating that he might want to harm himself by cutting his wrists. He has been here for several hours and been evaluated by mental health. At this, patient has retracted his statement. He tells me that he did not mean it when he said earlier that he wanted to harm himself and has no intention of harming himself. He was simply sitting at to get attention.  I repeatedly asked him if he wanted to harm himself at this point and he adamantly denied it each time. Patient has contracted for safety both verbally and in writing. He did confirm to me that he would call 911 or come back to the hospital if he had any thoughts of harming himself or anyone health in the future. Outpatient followup has been arranged for the patient.  Gilda Crease, MD 12/20/12 [phone]

## 2013-02-19 DIAGNOSIS — F3162 Bipolar disorder, current episode mixed, moderate: Secondary | ICD-10-CM | POA: Diagnosis not present

## 2013-05-20 DIAGNOSIS — F319 Bipolar disorder, unspecified: Secondary | ICD-10-CM | POA: Diagnosis not present

## 2013-06-13 ENCOUNTER — Ambulatory Visit: Payer: Self-pay | Admitting: Family Medicine

## 2013-08-19 DIAGNOSIS — F319 Bipolar disorder, unspecified: Secondary | ICD-10-CM | POA: Diagnosis not present

## 2013-08-25 ENCOUNTER — Emergency Department (HOSPITAL_COMMUNITY)
Admission: EM | Admit: 2013-08-25 | Discharge: 2013-08-25 | Disposition: A | Discharge status: Home / Self Care | Payer: Medicare Other | Attending: Emergency Medicine | Admitting: Emergency Medicine

## 2013-08-25 ENCOUNTER — Encounter (HOSPITAL_COMMUNITY): Payer: Self-pay | Admitting: Emergency Medicine

## 2013-08-25 DIAGNOSIS — Z87891 Personal history of nicotine dependence: Secondary | ICD-10-CM | POA: Diagnosis not present

## 2013-08-25 DIAGNOSIS — F209 Schizophrenia, unspecified: Secondary | ICD-10-CM | POA: Insufficient documentation

## 2013-08-25 DIAGNOSIS — B86 Scabies: Secondary | ICD-10-CM | POA: Insufficient documentation

## 2013-08-25 DIAGNOSIS — L089 Local infection of the skin and subcutaneous tissue, unspecified: Secondary | ICD-10-CM | POA: Insufficient documentation

## 2013-08-25 DIAGNOSIS — F3130 Bipolar disorder, current episode depressed, mild or moderate severity, unspecified: Secondary | ICD-10-CM | POA: Diagnosis not present

## 2013-08-25 DIAGNOSIS — Z79899 Other long term (current) drug therapy: Secondary | ICD-10-CM | POA: Diagnosis not present

## 2013-08-25 DIAGNOSIS — R000 Tachycardia, unspecified: Secondary | ICD-10-CM | POA: Diagnosis not present

## 2013-08-25 MED ORDER — PERMETHRIN 5 % EX CREA: TOPICAL_CREAM | CUTANEOUS | Status: DC

## 2013-08-25 MED ORDER — SULFAMETHOXAZOLE-TRIMETHOPRIM 800-160 MG PO TABS: 1.0000 | ORAL_TABLET | Freq: Two times a day (BID) | ORAL | Status: AC

## 2013-08-25 NOTE — ED Notes (Signed)
Pt c/o abscess to left buttock x 4 days.  

## 2013-08-25 NOTE — ED Provider Notes (Signed)
CSN: [id number]     Arrival date & time 08/25/13  1348 History   First MD Initiated Contact with Patient 08/25/13 1356     Chief Complaint  Patient presents with  . Abscess   (Consider location/radiation/quality/duration/timing/severity/associated sxs/prior Treatment) HPI Bruce Goodman is a 57 y.o. male who presents to the ED with a rash on his back, itching and a red tender area on his buttock. He lives in a trailer with his father. He sleeps on a mattress and in a sleeping bag on the floor. The rash started 4 days ago and has gotten a lot worse. The red areas on the buttock have gotten more painful. He denies fever, chills, nausea, vomiting or other problems.    Past Medical History  Diagnosis Date  . Bipolar 1 disorder   . Depression   . Schizophrenia    Past Surgical History  Procedure Laterality Date  . Wrist surgery      right  . No past surgeries      rt wrist surg and carpal tunnel syndrome    History reviewed. No pertinent family history. History  Substance Use Topics  . Smoking status: Former Games developer  . Smokeless tobacco: Not on file  . Alcohol Use: No    Review of Systems Negative except as stated in HPI  Allergies  Haldol and Seroquel  Home Medications   Current Outpatient Rx  Name  Route  Sig  Dispense  Refill  . gabapentin (NEURONTIN) 600 MG tablet   Oral   Take 1 tablet (600 mg total) by mouth 3 (three) times daily.   90 tablet   0   . mirtazapine (REMERON) 7.5 MG tablet   Oral   Take 1 tablet (7.5 mg total) by mouth at bedtime.   30 tablet   0   . OLANZapine (ZYPREXA) 20 MG tablet   Oral   Take 20 mg by mouth at bedtime.         . traZODone (DESYREL) 100 MG tablet   Oral   Take 100 mg by mouth at bedtime.           BP 138/74  Pulse 106  Temp(Src) 98.3 F (36.8 C)  Resp 16  Ht 5\' 11"  (1.803 m)  Wt 173 lb (78.472 kg)  BMI 24.14 kg/m2  SpO2 96% Physical Exam  Nursing note and vitals reviewed. Constitutional: He is oriented  to person, place, and time. He appears well-developed and well-nourished. No distress.  HENT:  Head: Normocephalic.  Eyes: EOM are normal.  Neck: Neck supple.  Cardiovascular: Tachycardia present.   Pulmonary/Chest: Effort normal.  Abdominal: Soft. There is no tenderness.  Musculoskeletal:       Back:  There is a rash noted to the back with tiny scabs. The patient has been scratching the areas. On the buttock there is a slightly raised red area that is tender on the left side.   Neurological: He is alert and oriented to person, place, and time. No cranial nerve deficit.  Skin: Skin is warm and dry.  Psychiatric: He has a normal mood and affect. His behavior is normal.    ED Course: Dr. Adriana Simas in to examine the patient. Will treat for scabies and for infection.   Procedures  EKG Interpretation   None       MDM  57 y.o. male with rash to back and tender red area to buttock x 4 days.  Will treat for possible scabies and start antibiotics for  skin infection on buttocks.  Discussed with the patient and all questioned fully answered.    Medication List    TAKE these medications       permethrin 5 % cream  Commonly known as:  ELIMITE  Apply to affected areas on back once     sulfamethoxazole-trimethoprim 800-160 MG per tablet  Commonly known as:  BACTRIM DS,SEPTRA DS  Take 1 tablet by mouth 2 (two) times daily.      ASK your doctor about these medications       gabapentin 600 MG tablet  Commonly known as:  NEURONTIN  Take 1 tablet (600 mg total) by mouth 3 (three) times daily.     mirtazapine 7.5 MG tablet  Commonly known as:  REMERON  Take 1 tablet (7.5 mg total) by mouth at bedtime.     OLANZapine 20 MG tablet  Commonly known as:  ZYPREXA  Take 20 mg by mouth at bedtime.     traZODone 100 MG tablet  Commonly known as:  DESYREL  Take 100 mg by mouth at bedtime.         Janne Napoleon, [place] 08/25/13 [phone]

## 2013-08-27 NOTE — ED Provider Notes (Signed)
Medical screening examination/treatment/procedure(s) were conducted as a shared visit with non-physician practitioner(s) and myself.  I personally evaluated the patient during the encounter.  EKG Interpretation   None      History and physical consistent with scabies on back. Also minor cellulitis of upper buttocks. Patient is nontoxic  Donnetta Hutching, MD 08/27/13 [phone]

## 2013-11-08 ENCOUNTER — Encounter (HOSPITAL_COMMUNITY): Payer: Self-pay | Admitting: Emergency Medicine

## 2013-11-08 ENCOUNTER — Emergency Department (HOSPITAL_COMMUNITY): Payer: Medicare Other

## 2013-11-08 ENCOUNTER — Emergency Department (HOSPITAL_COMMUNITY)
Admission: EM | Admit: 2013-11-08 | Discharge: 2013-11-08 | Disposition: A | Discharge status: Home / Self Care | Payer: Medicare Other | Attending: Emergency Medicine | Admitting: Emergency Medicine

## 2013-11-08 DIAGNOSIS — F3130 Bipolar disorder, current episode depressed, mild or moderate severity, unspecified: Secondary | ICD-10-CM | POA: Diagnosis not present

## 2013-11-08 DIAGNOSIS — R109 Unspecified abdominal pain: Secondary | ICD-10-CM | POA: Diagnosis present

## 2013-11-08 DIAGNOSIS — R0789 Other chest pain: Secondary | ICD-10-CM | POA: Insufficient documentation

## 2013-11-08 DIAGNOSIS — Z87891 Personal history of nicotine dependence: Secondary | ICD-10-CM | POA: Insufficient documentation

## 2013-11-08 DIAGNOSIS — Z79899 Other long term (current) drug therapy: Secondary | ICD-10-CM | POA: Diagnosis not present

## 2013-11-08 DIAGNOSIS — R079 Chest pain, unspecified: Secondary | ICD-10-CM | POA: Diagnosis not present

## 2013-11-08 DIAGNOSIS — F209 Schizophrenia, unspecified: Secondary | ICD-10-CM | POA: Insufficient documentation

## 2013-11-08 LAB — CBC WITH DIFFERENTIAL/PLATELET
Basophils Absolute: 0 10*3/uL (ref 0.0–0.1)
Basophils Relative: 1 % (ref 0–1)
Eosinophils Absolute: 0.1 10*3/uL (ref 0.0–0.7)
Eosinophils Relative: 2 % (ref 0–5)
HCT: 45.8 % (ref 39.0–52.0)
Hemoglobin: 15.6 g/dL (ref 13.0–17.0)
Lymphocytes Relative: 33 % (ref 12–46)
Lymphs Abs: 2.2 10*3/uL (ref 0.7–4.0)
MCH: 31.7 pg (ref 26.0–34.0)
MCHC: 34.1 g/dL (ref 30.0–36.0)
MCV: 93.1 fL (ref 78.0–100.0)
Monocytes Absolute: 0.5 10*3/uL (ref 0.1–1.0)
Monocytes Relative: 7 % (ref 3–12)
Neutro Abs: 3.9 10*3/uL (ref 1.7–7.7)
Neutrophils Relative %: 58 % (ref 43–77)
Platelets: 195 10*3/uL (ref 150–400)
RBC: 4.92 MIL/uL (ref 4.22–5.81)
RDW: 13.1 % (ref 11.5–15.5)
WBC: 6.6 10*3/uL (ref 4.0–10.5)

## 2013-11-08 LAB — BASIC METABOLIC PANEL
BUN: 13 mg/dL (ref 6–23)
CO2: 26 mEq/L (ref 19–32)
Calcium: 9.3 mg/dL (ref 8.4–10.5)
Chloride: 101 mEq/L (ref 96–112)
Creatinine, Ser: 0.94 mg/dL (ref 0.50–1.35)
GFR calc Af Amer: >90 mL/min (ref >90)
GFR calc non Af Amer: >90 mL/min (ref >90)
Glucose, Bld: 103 mg/dL — ABNORMAL HIGH (ref 70–99)
Potassium: 3.7 mEq/L (ref 3.7–5.3)
Sodium: 141 mEq/L (ref 137–147)

## 2013-11-08 LAB — D-DIMER, QUANTITATIVE (NOT AT ARMC): D-Dimer, Quant: 0.42 ug/mL-FEU (ref 0.00–0.48)

## 2013-11-08 LAB — PRO B NATRIURETIC PEPTIDE: Pro B Natriuretic peptide (BNP): 12.9 pg/mL (ref 0–125)

## 2013-11-08 LAB — TROPONIN I: Troponin I: <0.3 ng/mL (ref <0.30)

## 2013-11-08 MED ORDER — FAMOTIDINE 20 MG PO TABS: 20.0000 mg | ORAL_TABLET | Freq: Two times a day (BID) | ORAL | Status: DC

## 2013-11-08 MED ORDER — TRAMADOL HCL 50 MG PO TABS: 50.0000 mg | ORAL_TABLET | Freq: Four times a day (QID) | ORAL | Status: DC | PRN

## 2013-11-08 NOTE — ED Notes (Signed)
Patient c/o left upper abd pain x3-4 months. Patient denies any nausea, vomiting, diarrhea, or fevers. Patient states "I just got tired of having to put pressure on it to make it stop.

## 2013-11-08 NOTE — ED Provider Notes (Signed)
CSN: [id number]     Arrival date & time 11/08/13  1457 History   First MD Initiated Contact with Patient 11/08/13 1544     Chief Complaint  Patient presents with  . Abdominal Pain   (Consider location/radiation/quality/duration/timing/severity/associated sxs/prior Treatment) Patient is a 58 y.o. male presenting with abdominal pain. The history is provided by the patient.  Abdominal Pain Associated symptoms: chest pain   Associated symptoms: no cough, no dysuria, no fever, no nausea, no shortness of breath and no vomiting    patient with complaint of left lateral chest pain been present for several months he says anywhere from 4-6 months constant no changing. Denies any nausea or vomiting denies shortness of breath. Patient states that the pain is more of a discomfort. Nothing makes it better nothing makes it worse. He he rates the pain as 3/10. Patient denies any injuries or falls. Patient denies is getting worse. At triage patient was complaining of abdominal pain but he clearly points to his left lateral chest wall as the area pain which is somewhere around 6 and seventh rib area. Past Medical History  Diagnosis Date  . Bipolar 1 disorder   . Depression   . Schizophrenia    Past Surgical History  Procedure Laterality Date  . Wrist surgery      right  . No past surgeries      rt wrist surg and carpal tunnel syndrome    History reviewed. No pertinent family history. History  Substance Use Topics  . Smoking status: Former Smoker -- 0.50 packs/day for 10 years    Types: Cigarettes  . Smokeless tobacco: Never Used  . Alcohol Use: No    Review of Systems  Constitutional: Negative for fever.  HENT: Negative for congestion.   Eyes: Negative for redness.  Respiratory: Negative for cough and shortness of breath.   Cardiovascular: Positive for chest pain.  Gastrointestinal: Negative for nausea, vomiting and abdominal pain.  Genitourinary: Negative for dysuria.  Musculoskeletal:  Negative for back pain.  Skin: Negative for rash.  Neurological: Negative for headaches.  Hematological: Does not bruise/bleed easily.  Psychiatric/Behavioral: Negative for confusion.    Allergies  Haldol and Seroquel  Home Medications   Current Outpatient Rx  Name  Route  Sig  Dispense  Refill  . gabapentin (NEURONTIN) 600 MG tablet   Oral   Take 1 tablet (600 mg total) by mouth 3 (three) times daily.   90 tablet   0   . mirtazapine (REMERON) 7.5 MG tablet   Oral   Take 1 tablet (7.5 mg total) by mouth at bedtime.   30 tablet   0   . OLANZapine (ZYPREXA) 20 MG tablet   Oral   Take 20 mg by mouth at bedtime.         . traZODone (DESYREL) 100 MG tablet   Oral   Take 100 mg by mouth at bedtime.          . famotidine (PEPCID) 20 MG tablet   Oral   Take 1 tablet (20 mg total) by mouth 2 (two) times daily.   28 tablet   0   . traMADol (ULTRAM) 50 MG tablet   Oral   Take 1 tablet (50 mg total) by mouth every 6 (six) hours as needed.   15 tablet   0    BP 148/90  Pulse 99  Temp(Src) 98.3 F (36.8 C) (Oral)  Resp 20  Ht 5\' 11"  (1.803 m)  Wt 186 lb  4.8 oz (84.505 kg)  BMI 26.00 kg/m2  SpO2 98% Physical Exam  Nursing note and vitals reviewed. Constitutional: He appears well-developed and well-nourished. No distress.  HENT:  Head: Normocephalic and atraumatic.  Mouth/Throat: Oropharynx is clear and moist.  Eyes: Conjunctivae are normal. Pupils are equal, round, and reactive to light.  Neck: Normal range of motion.  Cardiovascular: Normal rate, regular rhythm and normal heart sounds.   No murmur heard. Pulmonary/Chest: Breath sounds normal. No respiratory distress.  Abdominal: Soft. Bowel sounds are normal. There is no tenderness.  Musculoskeletal: Normal range of motion. He exhibits no edema.  Neurological: He is alert. No cranial nerve deficit. He exhibits normal muscle tone. Coordination normal.  Skin: Skin is warm. No rash noted.    ED Course   Procedures (including critical care time)  The patient's room air sats are 98%.  Labs Review Labs Reviewed  BASIC METABOLIC PANEL - Abnormal; Notable for the following:    Glucose, Bld 103 (*)    All other components within normal limits  TROPONIN I  D-DIMER, QUANTITATIVE  PRO B NATRIURETIC PEPTIDE  CBC WITH DIFFERENTIAL   Results for orders placed during the hospital encounter of 11/08/13  TROPONIN I      Result Value Range   Troponin I <0.30  <0.30 ng/mL  D-DIMER, QUANTITATIVE      Result Value Range   D-Dimer, Quant 0.42  0.00 - 0.48 ug/mL-FEU  PRO B NATRIURETIC PEPTIDE      Result Value Range   Pro B Natriuretic peptide (BNP) 12.9  0 - 125 pg/mL  CBC WITH DIFFERENTIAL      Result Value Range   WBC 6.6  4.0 - 10.5 K/uL   RBC 4.92  4.22 - 5.81 MIL/uL   Hemoglobin 15.6  13.0 - 17.0 g/dL   HCT 45.8  39.0 - 52.0 %   MCV 93.1  78.0 - 100.0 fL   MCH 31.7  26.0 - 34.0 pg   MCHC 34.1  30.0 - 36.0 g/dL   RDW 13.1  11.5 - 15.5 %   Platelets 195  150 - 400 K/uL   Neutrophils Relative % 58  43 - 77 %   Neutro Abs 3.9  1.7 - 7.7 K/uL   Lymphocytes Relative 33  12 - 46 %   Lymphs Abs 2.2  0.7 - 4.0 K/uL   Monocytes Relative 7  3 - 12 %   Monocytes Absolute 0.5  0.1 - 1.0 K/uL   Eosinophils Relative 2  0 - 5 %   Eosinophils Absolute 0.1  0.0 - 0.7 K/uL   Basophils Relative 1  0 - 1 %   Basophils Absolute 0.0  0.0 - 0.1 K/uL  BASIC METABOLIC PANEL      Result Value Range   Sodium 141  137 - 147 mEq/L   Potassium 3.7  3.7 - 5.3 mEq/L   Chloride 101  96 - 112 mEq/L   CO2 26  19 - 32 mEq/L   Glucose, Bld 103 (*) 70 - 99 mg/dL   BUN 13  6 - 23 mg/dL   Creatinine, Ser 0.94  0.50 - 1.35 mg/dL   Calcium 9.3  8.4 - 10.5 mg/dL   GFR calc non Af Amer >90  >90 mL/min   GFR calc Af Amer >90  >90 mL/min    Imaging Review Dg Ribs Unilateral W/chest Left  11/08/2013   CLINICAL DATA:  Anterior rib pain  EXAM: LEFT RIBS AND CHEST - 3+  VIEW  COMPARISON:  11/04/2012  FINDINGS: No  fracture or other bone lesions are seen involving the ribs. There is no evidence of pneumothorax or pleural effusion. Both lungs are clear. Heart size and mediastinal contours are within normal limits.  IMPRESSION: No acute abnormality noted.   Electronically Signed   By: Inez Catalina M.D.   On: 11/08/2013 16:29    EKG Interpretation    Date/Time:  Friday November 08 2013 16:05:15 EST Ventricular Rate:  82 PR Interval:  166 QRS Duration: 98 QT Interval:  360 QTC Calculation: 420 R Axis:   -19 Text Interpretation:  Normal sinus rhythm Incomplete right bundle branch block Borderline ECG When compared with ECG of 04-Nov-2012 20:31, Incomplete right bundle branch block is now Present Confirmed by Emersen Carroll  MD, Amora Sheehy ([id number]) on 11/08/2013 4:08:05 PM            MDM   1. Chest pain    Workup for the chest pain is negative for acute cardiac event. No significant changes on EKG. Troponin is negative. Labs without any specific abnormalities to include a negative d-dimer negative BNP. Chest x-rays negative for pneumonia pneumothorax or pulmonary edema. No evidence based on d-dimer of pulmonary embolus. The symptoms have been ongoing for a long period of time most concerning for probably chest wall pain. Referral information provided. Will treat with Pepcid and tramadol and recommend patient to primary care Dr. for followup.   Mervin Kung, MD 11/08/13 1726

## 2013-11-08 NOTE — Discharge Instructions (Signed)
°Emergency Department Resource Guide °1) Find a Doctor and Pay Out of Pocket °Although you won't have to find out who is covered by your insurance plan, it is a good idea to ask around and get recommendations. You will then need to call the office and see if the doctor you have chosen will accept you as a new patient and what types of options they offer for patients who are self-pay. Some doctors offer discounts or will set up payment plans for their patients who do not have insurance, but you will need to ask so you aren't surprised when you get to your appointment. ° °2) Contact Your Local Health Department °Not all health departments have doctors that can see patients for sick visits, but many do, so it is worth a call to see if yours does. If you don't know where your local health department is, you can check in your phone book. The CDC also has a tool to help you locate your state's health department, and many state websites also have listings of all of their local health departments. ° °3) Find a Walk-in Clinic °If your illness is not likely to be very severe or complicated, you may want to try a walk in clinic. These are popping up all over the country in pharmacies, drugstores, and shopping centers. They're usually staffed by nurse practitioners or physician assistants that have been trained to treat common illnesses and complaints. They're usually fairly quick and inexpensive. However, if you have serious medical issues or chronic medical problems, these are probably not your best option. ° °No Primary Care Doctor: °- Call Health Connect at  [phone] - they can help you locate a primary care doctor that  accepts your insurance, provides certain services, etc. °- Physician Referral Service- 1-800-533-3463 ° °Chronic Pain Problems: °Organization         Address  Phone   Notes  °[facility]  ([phone]) [phone] Patients need to be referred by their primary care doctor.  ° °Medication  Assistance: °Organization         Address  Phone   Notes  °[place] [facility] [address] °[place], [place] [address] (336) 641-8030 --Must be a resident of [place] °-- Must have NO insurance coverage whatsoever (no Medicaid/ Medicare, etc.) °-- The pt. MUST have a primary care doctor that directs their care regularly and follows them in the community °  °[facility]  (866) 331-1348   °[employer]  (888) 892-1162   ° °Agencies that provide inexpensive medical care: °Organization         Address  Phone   Notes  °[facility]  (336) 832-8035   °[facility]    (336) 832-7272   °[facility] [address] °[place], [place] [address] (336) 832-4777   °[facility] [address], °[place] (336) 271-4999   °[facility]    (336) 373-0678   °[facility]    (336) 272-1050   °[facility] ° [address], [place] Phone:  (336) 832-4444, Fax:  (336) 832-4440 Hours of Operation:  9 am - 6 pm, M-F.  Also accepts Medicaid/Medicare and self-pay.  °[facility] ° [address],  Phone: (336) 832-3150, Fax: (336) [phone]. Hours of Operation:  8:30 am - 5:30 pm, M-F.  Also accepts Medicaid and self-pay.  °[facility] [place] [address], [place] Phone: (336) 878-6027   °[facility] [address], [place], [place] (336)723-1848, Ext. 123 Mondays & Thursdays: 7-9 AM.  First 15 patients are seen on a first come, first serve basis. °  ° °Medicaid-accepting [place] Providers: ° °Organization         Address  Phone   Notes  °[facility] [address], [place] (336) 641-2100 Also accepts self-pay patients.  °[facility] [address], [place] ° (336) 856-9996   °[facility] [address], [place]  (336) 288-8857   °[facility] [address] [place] [address], [place] (336) 299-7000   °[facility] [address], [place]  ° (336) 373-1557 Only accepts [place] Access Medicaid patients after they have their name applied to their card.  ° °Self-Pay (no insurance) in [place]: ° °Organization         Address  Phone   Notes  °Sickle Cell Patients, [facility] [address], [place] (336) 832-1970   °[facility] [address], [place] (336) 832-4400   °[facility] ° [address], [place] (336) 992-4800   °[facility]/Dr. Osei-Bonsu ° [address] [place] [address], [place] or [address], [place] (336) [phone] Phone number for both [place] and [place] locations is the same.  °[facility] [address], [place] (336) 299-0000   °[facility] [address] [place] [address], [place] or [address] (336) 852-7530 °(336) 878-2260   °[facility] [address], [place] (336) 350-1642, phone; (336) 294-5005, fax Sees patients 1st and 3rd Saturday of every month.  Must not qualify for public or private insurance (i.e. Medicaid, Medicare, [place] Health Choice, Veterans' Benefits) • Household income should be no more than 200% of the poverty level •The clinic cannot treat you if you are pregnant or think you are pregnant • Sexually transmitted diseases are not treated at the clinic.  ° ° °Dental Care: °Organization         Address  Phone  Notes  °[place] [facility] [address], [place] (336) 641-6152 Accepts children up to age 21 who are enrolled in Medicaid or [place] Health Choice; pregnant women with a Medicaid card; and children who have applied for Medicaid or [place] Health Choice, but were declined, whose parents can pay a reduced fee at time of service.  °[place]  [facility] [place]  [address], [place] (336) 641-7733 Accepts children up to age 21 who are enrolled in Medicaid or [place] Health Choice; pregnant women with a Medicaid card; and children who have applied for Medicaid or [place] Health Choice, but were declined, whose parents can pay a reduced fee at time of service.  °[facility] ° [address], [place] (336) 641-4533 Patients are seen by appointment only. Walk-ins are not accepted. [facility] will see patients 18 years of age and older. °Monday - Tuesday (8am-5pm) °Most Wednesdays (8:30-5pm) °$30 per visit, cash only  °[facility] ° [address], [place] (336) 641-4533 Patients are seen by appointment only. Walk-ins are not accepted. [facility] will see patients 18 years of age and older. °One   Wednesday Evening (Monthly: Volunteer Based).  $30 per visit, cash only  °[facility]  (919) 537-3737 for adults; Children under age 4, call Graduate Pediatric Dentistry at (919) [phone]. Children aged 4-14, please call (919) [phone] to request a pediatric application. ° Dental services are provided in all areas of dental care including fillings, crowns and bridges, complete and partial dentures, implants, gum treatment, root canals, and extractions. Preventive care is also provided. Treatment is provided to both adults and children. °Patients are selected via a lottery and there is often a waiting list. °  °[facility] [address], °[place] ° (336) 763-8833 [email or web address] °  °[facility] [address], [place], [place] (336)723-1848, Ext. 123 Second and Fourth Thursday of each month, opens at 6:30 AM; Clinic ends at 9 AM.  Patients are seen on a first-come first-served basis, and a limited number are seen during each clinic.  ° °[facility] ° [address], [place], [place] (336) [phone]    Eligibility Requirements °You must have lived in [place], [place], or [place] counties for at least the last three months. °  You cannot be eligible for state or federal sponsored healthcare insurance, including Veterans Administration, Medicaid, or Medicare. °  You generally cannot be eligible for healthcare insurance through your employer.  °  How to apply: °Eligibility screenings are held every Tuesday and Wednesday afternoon from 1:00 pm until 4:00 pm. You do not need an appointment for the interview!  °[facility] [address], [place], [place] 336-631-2330   °[facility]  336-342-8273   °[facility]  336-703-3100   °[facility]  336-570-6415   ° °Behavioral Health Resources in the Community: °Intensive Outpatient Programs °Organization         Address  Phone  Notes  °[facility] [address], [place], [place] 336-878-6098   °[facility] [address], [place], [place] 336-832-9800   °[facility] [address], [place], [place] ° 336-882-2125   °[facility] [address],  °[place], [place] 1-800-853-5163 or 336-641-4981   °Substance Abuse Resources °Organization         Address  Phone  Notes  °[facility]  336-882-2125   °[facility]  336-784-9470   °[facility]  336-285-9073   °[facility]  336-845-3988   °Residential & Outpatient Substance Abuse Program  1-800-659-3381   °Psychological Services °Organization         Address  Phone  Notes  ° Health  336- 832-9600   °[facility]  336- 378-7881   °[facility] [address], [place] 1-800-853-5163 or 336-641-4981   ° °Mobile Crisis Teams °Organization         Address  Phone  Notes  °[facility], Mobile Crisis Care Unit  1-877-626-1772   °Assertive °[facility] ° [address]  [place],  336-834-9664   °Sharon DeEsch [address] °[place]  336-554-5454   ° °Self-Help/Support Groups °Organization         Address  Phone             Notes  °[facility] - variety of support groups  336- 373-1402 Call for more information  °Narcotics Anonymous (NA), [facility] [address], °[place]   2 meetings at this location  ° °  Residential Treatment Programs Organization         Address  Phone  Notes  [facility] [address]    [place]  1-364-570-6888   [facility]  [address] [place] [address], [place], [place]   [place], [place] [phone] Admissions: 8am-3pm M-F  [facility] [place] [address],    [place], [place] 923-300-7622   [facility] [address] [place], [place], [place]   [facility] [address]  [place], [place]   [facility] - Intensive Outpatient [address], [place], [place]   [facility] [address]  [place], [place] 1-7160084383 or 915-210-6908   [facility] [address], [place], [place] Accepts Medicaid  [facility] [address]  [place] 1-806-224-6899 Substance Abuse/Addiction Treatment   [facility]         Address  Phone  Notes  [facility]  (867)410-1300   Domenic Schwab, PhD [address] [place], [place]   820-151-0619 or (571) 115-7925   [facility] [place], [place] 250-489-6269   [facility] [address], [place], [place] 720-291-7242 Insurance/Medicaid/sponsorship through [facility] [address]                                    [place], [place] [phone] [place] [address], [place] [phone]    Dr. Adele Schilder  7622471641   [facility] Dept. 1) [address], [place] 2) [place] 3)  [address], [place] [phone]  (719) [phone]   [place] 204-134-8559 or [phone] (After Hours)      Resource guide provided to help you find a regular doctor. Workup here today for the chest pain was negative. No evidence of serious problem. Take Pepcid as directed take tramadol as needed for pain. Work on finding her record Dr. for followup. Return for any new or worse symptoms.

## 2013-11-11 DIAGNOSIS — F319 Bipolar disorder, unspecified: Secondary | ICD-10-CM | POA: Diagnosis not present

## 2013-12-16 ENCOUNTER — Ambulatory Visit (INDEPENDENT_AMBULATORY_CARE_PROVIDER_SITE_OTHER): Payer: Medicare Other

## 2013-12-16 ENCOUNTER — Ambulatory Visit (INDEPENDENT_AMBULATORY_CARE_PROVIDER_SITE_OTHER): Payer: Medicare Other | Admitting: Family Medicine

## 2013-12-16 ENCOUNTER — Encounter: Payer: Self-pay | Admitting: Family Medicine

## 2013-12-16 ENCOUNTER — Encounter (INDEPENDENT_AMBULATORY_CARE_PROVIDER_SITE_OTHER): Payer: Self-pay

## 2013-12-16 VITALS — BP 124/86 | HR 98 | Temp 99.1°F | Ht 71.0 in | Wt 198.0 lb

## 2013-12-16 DIAGNOSIS — R0781 Pleurodynia: Secondary | ICD-10-CM

## 2013-12-16 DIAGNOSIS — Z1211 Encounter for screening for malignant neoplasm of colon: Secondary | ICD-10-CM

## 2013-12-16 DIAGNOSIS — R079 Chest pain, unspecified: Secondary | ICD-10-CM | POA: Diagnosis not present

## 2013-12-16 DIAGNOSIS — R911 Solitary pulmonary nodule: Secondary | ICD-10-CM

## 2013-12-16 DIAGNOSIS — R5383 Other fatigue: Secondary | ICD-10-CM | POA: Diagnosis not present

## 2013-12-16 DIAGNOSIS — Z136 Encounter for screening for cardiovascular disorders: Secondary | ICD-10-CM | POA: Diagnosis not present

## 2013-12-16 DIAGNOSIS — R5381 Other malaise: Secondary | ICD-10-CM | POA: Diagnosis not present

## 2013-12-16 LAB — POCT GLYCOSYLATED HEMOGLOBIN (HGB A1C): Hemoglobin A1C: 5.3

## 2013-12-16 MED ORDER — MELOXICAM 15 MG PO TABS: 15.0000 mg | ORAL_TABLET | Freq: Every day | ORAL | Status: DC
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
## 2013-12-16 NOTE — Patient Instructions (Signed)
Check with insurance company about cost of Zostavax, Tdap, and Prevnar.  These vaccinations are recommended.

## 2013-12-17 LAB — NMR, LIPOPROFILE
Cholesterol: 264 mg/dL — ABNORMAL HIGH (ref <200)
HDL Cholesterol by NMR: 42 mg/dL (ref >=40)
HDL Particle Number: 28.9 umol/L — ABNORMAL LOW (ref >=30.5)
LDL Particle Number: 2615 nmol/L — ABNORMAL HIGH (ref <1000)
LDL Size: 20.4 nm — ABNORMAL LOW (ref >20.5)
LDLC SERPL CALC-MCNC: 155 mg/dL — ABNORMAL HIGH (ref <100)
LP-IR Score: 83 — ABNORMAL HIGH (ref <=45)
Small LDL Particle Number: 1592 nmol/L — ABNORMAL HIGH (ref <=527)
Triglycerides by NMR: 334 mg/dL — ABNORMAL HIGH (ref <150)

## 2013-12-17 LAB — COMPREHENSIVE METABOLIC PANEL
ALT: 18 IU/L (ref 0–44)
AST: 17 IU/L (ref 0–40)
Albumin/Globulin Ratio: 2 (ref 1.1–2.5)
Albumin: 4.8 g/dL (ref 3.5–5.5)
Alkaline Phosphatase: 58 IU/L (ref 39–117)
BUN/Creatinine Ratio: 17 (ref 9–20)
BUN: 16 mg/dL (ref 6–24)
CO2: 20 mmol/L (ref 18–29)
Calcium: 9.6 mg/dL (ref 8.7–10.2)
Chloride: 103 mmol/L (ref 97–108)
Creatinine, Ser: 0.96 mg/dL (ref 0.76–1.27)
GFR calc Af Amer: 101 mL/min/{1.73_m2} (ref >59)
GFR calc non Af Amer: 87 mL/min/{1.73_m2} (ref >59)
Globulin, Total: 2.4 g/dL (ref 1.5–4.5)
Glucose: 90 mg/dL (ref 65–99)
Potassium: 4.2 mmol/L (ref 3.5–5.2)
Sodium: 141 mmol/L (ref 134–144)
Total Bilirubin: 1.3 mg/dL — ABNORMAL HIGH (ref 0.0–1.2)
Total Protein: 7.2 g/dL (ref 6.0–8.5)

## 2013-12-17 LAB — CBC

## 2013-12-17 LAB — TSH: TSH: 1.75 u[IU]/mL (ref 0.450–4.500)

## 2013-12-18 ENCOUNTER — Other Ambulatory Visit: Payer: Self-pay | Admitting: Family Medicine

## 2013-12-18 LAB — CBC
HCT: 45.7 % (ref 37.5–51.0)
Hemoglobin: 15.7 g/dL (ref 12.6–17.7)
MCH: 32.1 pg (ref 26.6–33.0)
MCHC: 34.4 g/dL (ref 31.5–35.7)
MCV: 94 fL (ref 79–97)
Platelets: 213 10*3/uL (ref 150–379)
RBC: 4.89 x10E6/uL (ref 4.14–5.80)
RDW: 14.5 % (ref 12.3–15.4)
WBC: 6.9 10*3/uL (ref 3.4–10.8)

## 2013-12-18 MED ORDER — PRAVASTATIN SODIUM 80 MG PO TABS: 80.0000 mg | ORAL_TABLET | Freq: Every day | ORAL | Status: DC

## 2013-12-20 ENCOUNTER — Telehealth: Payer: Self-pay | Admitting: Family Medicine

## 2013-12-20 NOTE — Telephone Encounter (Signed)
Patient aware Dr. Ernestina Patches would be in monday

## 2014-01-16 ENCOUNTER — Encounter: Payer: Self-pay | Admitting: Family Medicine

## 2014-02-04 DIAGNOSIS — F319 Bipolar disorder, unspecified: Secondary | ICD-10-CM | POA: Diagnosis not present

## 2014-05-19 DIAGNOSIS — F319 Bipolar disorder, unspecified: Secondary | ICD-10-CM | POA: Diagnosis not present

## 2014-06-28 IMAGING — CR DG CHEST 2V
2 series · 2 of 2 positions shown · non-contrast
Comparison: 04/09/2011.

CLINICAL DATA: Fall.  Tremors.  Dizziness.  Loss of consciousness.

CHEST - 2 VIEW

[view not recorded (1 of 2)]
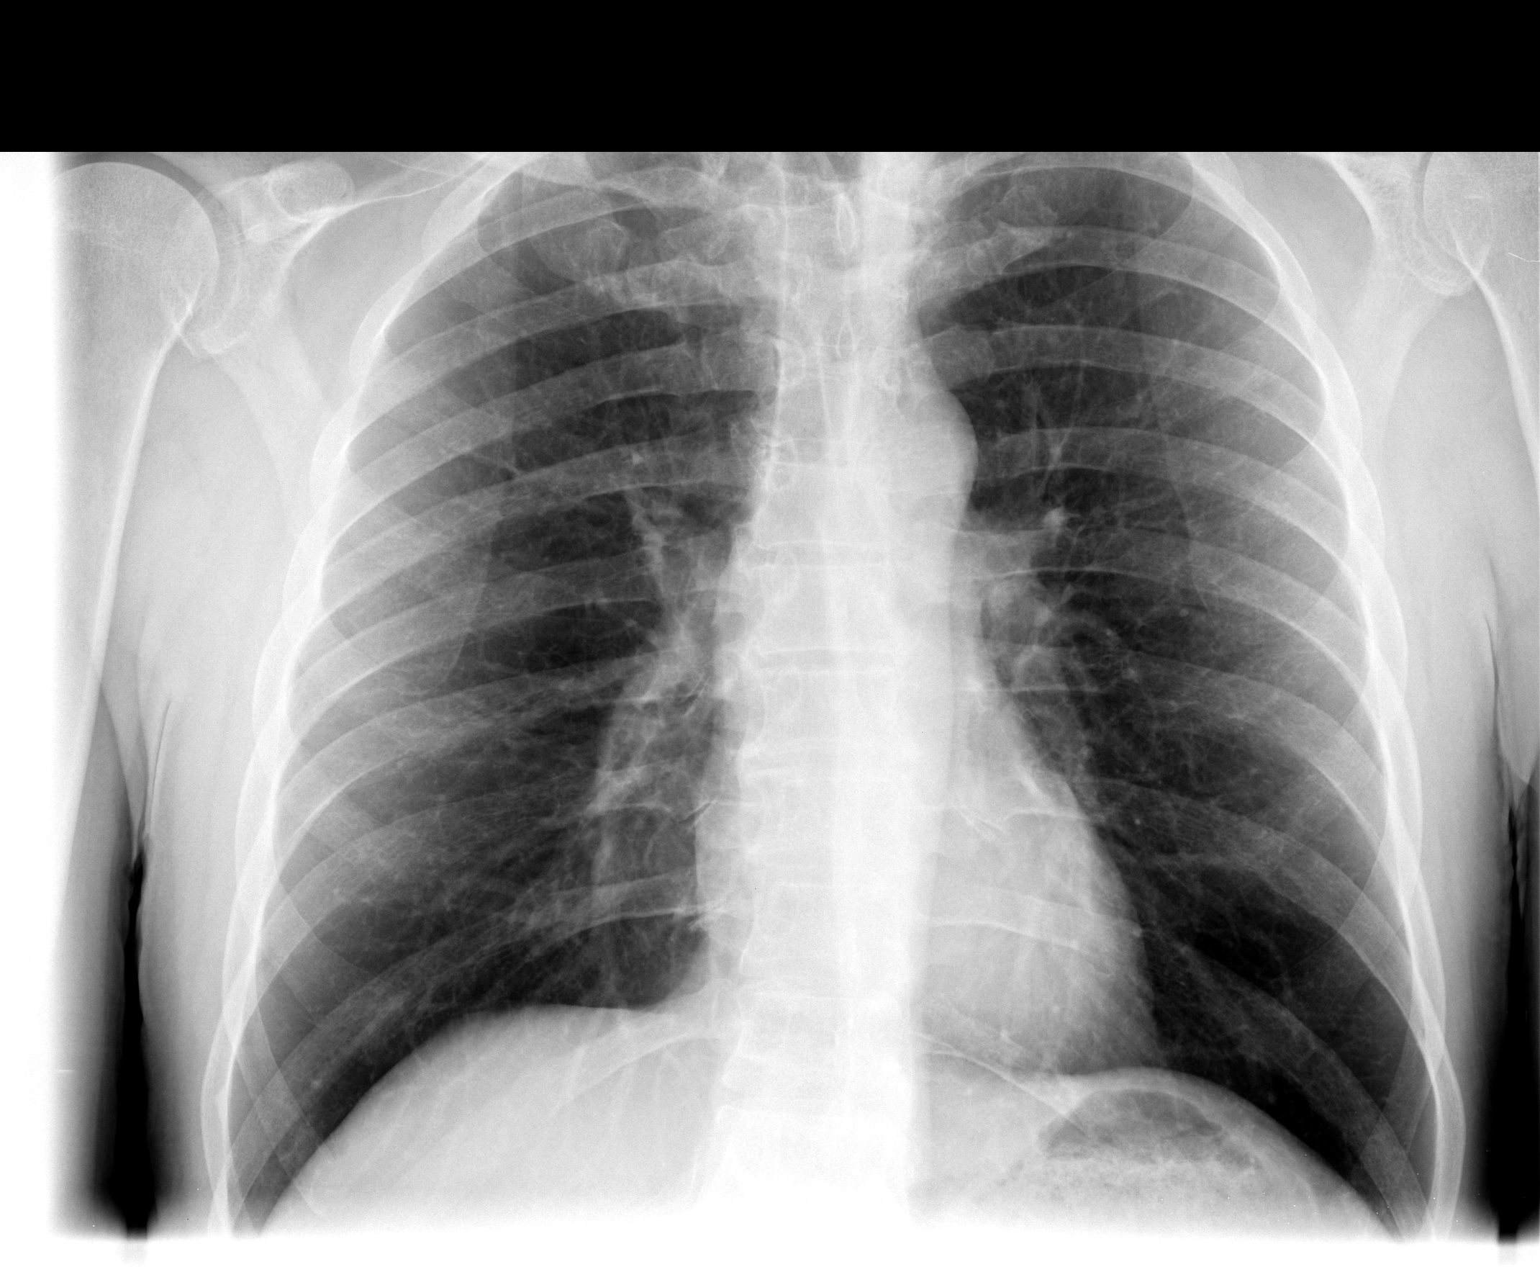

[view not recorded (2 of 2)]
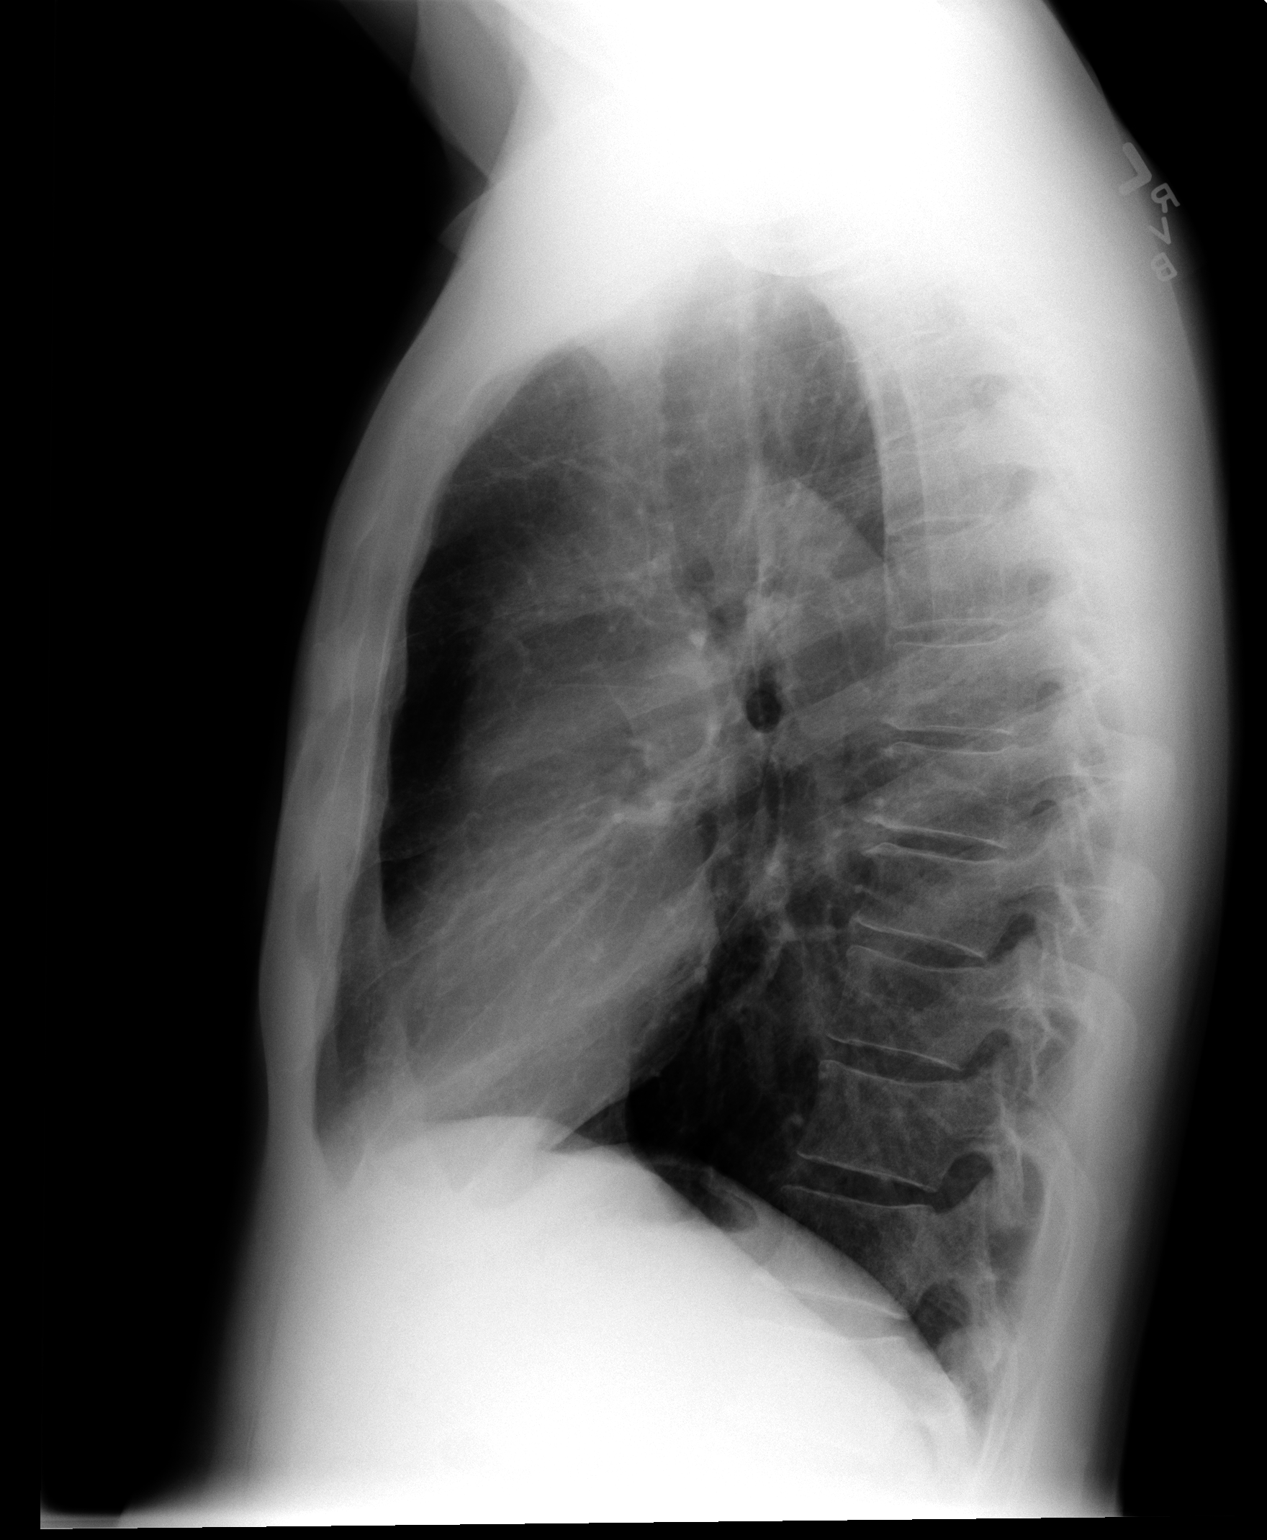

[2 of 2 positions shown; findings below may reference images not displayed]

FINDINGS: Cardiopericardial silhouette within normal limits.
Mediastinal contours normal. Trachea midline.  No airspace disease
or effusion.
IMPRESSION: No active cardiopulmonary disease.  No interval change.

## 2014-08-11 DIAGNOSIS — F319 Bipolar disorder, unspecified: Secondary | ICD-10-CM | POA: Diagnosis not present

## 2014-11-03 DIAGNOSIS — F319 Bipolar disorder, unspecified: Secondary | ICD-10-CM | POA: Diagnosis not present

## 2015-01-26 DIAGNOSIS — F319 Bipolar disorder, unspecified: Secondary | ICD-10-CM | POA: Diagnosis not present

## 2015-04-20 DIAGNOSIS — F319 Bipolar disorder, unspecified: Secondary | ICD-10-CM | POA: Diagnosis not present

## 2015-05-20 ENCOUNTER — Encounter: Payer: Medicare Other | Admitting: Family Medicine

## 2015-05-21 ENCOUNTER — Encounter: Payer: Self-pay | Admitting: Family Medicine

## 2015-07-13 DIAGNOSIS — S29012A Strain of muscle and tendon of back wall of thorax, initial encounter: Secondary | ICD-10-CM | POA: Diagnosis not present

## 2015-07-20 DIAGNOSIS — F319 Bipolar disorder, unspecified: Secondary | ICD-10-CM | POA: Diagnosis not present

## 2015-08-09 NOTE — Progress Notes (Signed)
Subjective:  Patient ID: Bruce Goodman, male    DOB: May 31, 1956  Age: 59 y.o. MRN: [id number]  CC: Annual Exam   HPI Bruce Goodman presents for annual wellness. He has a long history of chronic pain and sees psychiatry for schizophrenia. Meds have been decreased recently. The list below includes some no longer in use. Master list updated. Pt. Has a lung nodule he was supposed to have checked via CT. First found 2 years ago. Original recommendation was to have a 6 month repeat. He could not, due to lack of insurance. He would like to have that done now. Additionally he requested a muscle relaxer for his foot cramps. Occur through the day and night. No relation to activity noted. States cyclobenzaprine doesn't help.  History Bruce Goodman has a past medical history of Bipolar 1 disorder ([place]); Depression; Schizophrenia ([place]); and Lung nodule.   He has past surgical history that includes Wrist surgery and No past surgeries.   His family history includes COPD in his sister.He reports that he has quit smoking. His smoking use included Cigarettes. He has a 5 pack-year smoking history. He has never used smokeless tobacco. He reports that he does not drink alcohol or use illicit drugs.  Outpatient Prescriptions Prior to Visit  Medication Sig Dispense Refill  . gabapentin (NEURONTIN) 600 MG tablet Take 1 tablet (600 mg total) by mouth 3 (three) times daily. 90 tablet 0  . mirtazapine (REMERON) 7.5 MG tablet Take 1 tablet (7.5 mg total) by mouth at bedtime. 30 tablet 0  . traZODone (DESYREL) 100 MG tablet Take 100 mg by mouth at bedtime.     Marland Kitchen acetaminophen (TYLENOL) 500 MG tablet Take 500 mg by mouth every 6 (six) hours as needed.    . meloxicam (MOBIC) 15 MG tablet Take 1 tablet (15 mg total) by mouth daily. 30 tablet 1  . OLANZapine (ZYPREXA) 20 MG tablet Take 20 mg by mouth at bedtime.    . pravastatin (PRAVACHOL) 80 MG tablet Take 1 tablet (80 mg total) by mouth daily. (Patient not taking:  Reported on 08/10/2015) 90 tablet 3   No facility-administered medications prior to visit.    ROS Review of Systems  Constitutional: Negative for fever, chills and diaphoresis.  HENT: Negative for congestion, rhinorrhea and sore throat.   Respiratory: Negative for cough, shortness of breath and wheezing.   Cardiovascular: Negative for chest pain.  Gastrointestinal: Negative for nausea, vomiting, abdominal pain, diarrhea, constipation and abdominal distention.  Genitourinary: Negative for dysuria and frequency.  Musculoskeletal: Negative for joint swelling and arthralgias.  Skin: Negative for rash.  Neurological: Negative for headaches.    Objective:  BP 125/77 mmHg  Pulse 86  Temp(Src) 97.4 F (36.3 C) (Oral)  Ht _0  (1.803 m)  Wt 170 lb 6.4 oz (77.293 kg)  BMI 23.78 kg/m2  SpO2 98%  BP Readings from Last 3 Encounters:  08/10/15 125/77  12/16/13 124/86  11/08/13 124/84    Wt Readings from Last 3 Encounters:  08/10/15 170 lb 6.4 oz (77.293 kg)  12/16/13 198 lb (89.812 kg)  11/08/13 186 lb 4.8 oz (84.505 kg)     Physical Exam  Constitutional: He is oriented to person, place, and time. He appears well-developed and well-nourished. No distress.  HENT:  Head: Normocephalic and atraumatic.  Right Ear: External ear normal.  Left Ear: External ear normal.  Nose: Nose normal.  Mouth/Throat: Oropharynx is clear and moist.  Eyes: Conjunctivae and EOM are normal. Pupils are equal,  round, and reactive to light.  Neck: Normal range of motion. Neck supple. No thyromegaly present.  Cardiovascular: Normal rate, regular rhythm and normal heart sounds.   No murmur heard. Pulmonary/Chest: Effort normal and breath sounds normal. No respiratory distress. He has no wheezes. He has no rales.  Abdominal: Soft. Bowel sounds are normal. He exhibits no distension. There is no tenderness.  Lymphadenopathy:    He has no cervical adenopathy.  Neurological: He is alert and oriented to  person, place, and time. He has normal reflexes.  Skin: Skin is warm and dry.  Psychiatric: He has a normal mood and affect. His behavior is normal. Judgment and thought content normal.    Lab Results  Component Value Date   HGBA1C 5.3 12/16/2013   HGBA1C 5.6 10/21/2012    Lab Results  Component Value Date   WBC CANCELED 12/16/2013   WBC 6.9 12/16/2013   HGB CANCELED 12/16/2013   HGB 15.7 12/16/2013   HCT CANCELED 12/16/2013   HCT 45.7 12/16/2013   PLT CANCELED 12/16/2013   PLT 213 12/16/2013   GLUCOSE 90 12/16/2013   CHOL 264* 12/16/2013   TRIG 334* 12/16/2013   HDL 42 12/16/2013   LDLCALC 155* 12/16/2013   ALT 18 12/16/2013   AST 17 12/16/2013   NA 141 12/16/2013   K 4.2 12/16/2013   CL 103 12/16/2013   CREATININE 0.96 12/16/2013   BUN 16 12/16/2013   CO2 20 12/16/2013   TSH 1.750 12/16/2013   HGBA1C 5.3 12/16/2013    Dg Ribs Unilateral W/chest Left  11/08/2013  CLINICAL DATA:  Anterior rib pain EXAM: LEFT RIBS AND CHEST - 3+ VIEW COMPARISON:  11/04/2012 FINDINGS: No fracture or other bone lesions are seen involving the ribs. There is no evidence of pneumothorax or pleural effusion. Both lungs are clear. Heart size and mediastinal contours are within normal limits. IMPRESSION: No acute abnormality noted. Electronically Signed   By: Inez Catalina M.D.   On: 11/08/2013 16:29    Assessment & Plan:   Bruce Goodman was seen today for annual exam.  Diagnoses and all orders for this visit:  Wellness examination -     CBC with Differential/Platelet -     CMP14+EGFR -     POCT urinalysis dipstick -     Cancel: PSA, Medicare -     Hepatitis C antibody -     HIV antibody (with reflex) -     PSA, total and free  Generalized anxiety disorder -     CMP14+EGFR -     POCT urinalysis dipstick -     PSA, total and free  Bipolar affective disorder, depressed, severe, with psychotic behavior ([place]) -     CBC with Differential/Platelet -     CMP14+EGFR -     POCT urinalysis  dipstick -     PSA, total and free  Foot cramps -     Discontinue: tizanidine (ZANAFLEX) 2 MG capsule; Take 1 capsule (2 mg total) by mouth 3 (three) times daily. As needed for foot cramps -     CMP14+EGFR -     POCT urinalysis dipstick -     PSA, total and free  Prostate cancer screening -     CMP14+EGFR -     POCT urinalysis dipstick -     Cancel: PSA, Medicare -     PSA, total and free  Vitamin D deficiency -     CMP14+EGFR -     Vit D  25 hydroxy (  rtn osteoporosis monitoring) -     POCT urinalysis dipstick -     PSA, total and free  Hyperlipidemia -     CBC with Differential/Platelet -     CMP14+EGFR -     Lipid panel -     POCT urinalysis dipstick -     PSA, total and free  Lung nodule seen on imaging study -     CT CHEST NODULE FOLLOW UP LOW DOSE W/O; Future -     PSA, total and free  Screening for malignant neoplasm of the rectum -     Fecal occult blood, imunochemical   I have discontinued Bruce Goodman OLANZapine, acetaminophen, meloxicam, and pravastatin. I am also having him maintain his gabapentin, mirtazapine, and traZODone.  Meds ordered this encounter  Medications  . DISCONTD: tizanidine (ZANAFLEX) 2 MG capsule    Sig: Take 1 capsule (2 mg total) by mouth 3 (three) times daily. As needed for foot cramps    Dispense:  90 capsule    Refill:  2     Follow-up: Return in about 1 year (around 08/09/2016) for CPE.  Claretta Fraise, M.D.

## 2015-08-10 ENCOUNTER — Ambulatory Visit (INDEPENDENT_AMBULATORY_CARE_PROVIDER_SITE_OTHER): Payer: Medicare Other | Admitting: Family Medicine

## 2015-08-10 ENCOUNTER — Other Ambulatory Visit: Payer: Self-pay | Admitting: *Deleted

## 2015-08-10 ENCOUNTER — Encounter: Payer: Self-pay | Admitting: Family Medicine

## 2015-08-10 VITALS — BP 125/77 | HR 86 | Temp 97.4°F | Ht 71.0 in | Wt 170.4 lb

## 2015-08-10 DIAGNOSIS — Z0189 Encounter for other specified special examinations: Secondary | ICD-10-CM | POA: Diagnosis not present

## 2015-08-10 DIAGNOSIS — R252 Cramp and spasm: Secondary | ICD-10-CM | POA: Diagnosis not present

## 2015-08-10 DIAGNOSIS — E559 Vitamin D deficiency, unspecified: Secondary | ICD-10-CM

## 2015-08-10 DIAGNOSIS — E785 Hyperlipidemia, unspecified: Secondary | ICD-10-CM

## 2015-08-10 DIAGNOSIS — Z125 Encounter for screening for malignant neoplasm of prostate: Secondary | ICD-10-CM

## 2015-08-10 DIAGNOSIS — F315 Bipolar disorder, current episode depressed, severe, with psychotic features: Secondary | ICD-10-CM | POA: Diagnosis not present

## 2015-08-10 DIAGNOSIS — Z0000 Encounter for general adult medical examination without abnormal findings: Secondary | ICD-10-CM

## 2015-08-10 DIAGNOSIS — Z1212 Encounter for screening for malignant neoplasm of rectum: Secondary | ICD-10-CM

## 2015-08-10 DIAGNOSIS — R911 Solitary pulmonary nodule: Secondary | ICD-10-CM | POA: Diagnosis not present

## 2015-08-10 DIAGNOSIS — F411 Generalized anxiety disorder: Secondary | ICD-10-CM

## 2015-08-10 MED ORDER — TIZANIDINE HCL 2 MG PO CAPS: 2.0000 mg | ORAL_CAPSULE | Freq: Three times a day (TID) | ORAL | Status: DC

## 2015-08-10 NOTE — Progress Notes (Signed)
Order sent o [facility] [place] in error [facility] pt request

## 2015-08-12 ENCOUNTER — Other Ambulatory Visit: Payer: Self-pay | Admitting: Family Medicine

## 2015-08-12 LAB — CBC WITH DIFFERENTIAL/PLATELET
Basophils Absolute: 0 10*3/uL (ref 0.0–0.2)
Basos: 0 %
EOS (ABSOLUTE): 0.1 10*3/uL (ref 0.0–0.4)
Eos: 1 %
Hematocrit: 41.3 % (ref 37.5–51.0)
Hemoglobin: 14.5 g/dL (ref 12.6–17.7)
Immature Grans (Abs): 0 10*3/uL (ref 0.0–0.1)
Immature Granulocytes: 0 %
Lymphocytes Absolute: 1.9 10*3/uL (ref 0.7–3.1)
Lymphs: 19 %
MCH: 32.9 pg (ref 26.6–33.0)
MCHC: 35.1 g/dL (ref 31.5–35.7)
MCV: 94 fL (ref 79–97)
Monocytes Absolute: 0.4 10*3/uL (ref 0.1–0.9)
Monocytes: 4 %
Neutrophils Absolute: 7.4 10*3/uL — ABNORMAL HIGH (ref 1.4–7.0)
Neutrophils: 76 %
Platelets: 193 10*3/uL (ref 150–379)
RBC: 4.41 x10E6/uL (ref 4.14–5.80)
RDW: 13.8 % (ref 12.3–15.4)
WBC: 9.8 10*3/uL (ref 3.4–10.8)

## 2015-08-12 LAB — SPECIMEN STATUS REPORT

## 2015-08-12 LAB — CMP14+EGFR
ALT: 9 IU/L (ref 0–44)
AST: 13 IU/L (ref 0–40)
Albumin/Globulin Ratio: 1.6 (ref 1.1–2.5)
Albumin: 4 g/dL (ref 3.5–5.5)
Alkaline Phosphatase: 64 IU/L (ref 39–117)
BUN/Creatinine Ratio: 9 (ref 9–20)
BUN: 8 mg/dL (ref 6–24)
Bilirubin Total: 0.6 mg/dL (ref 0.0–1.2)
CO2: 28 mmol/L (ref 18–29)
Calcium: 8.9 mg/dL (ref 8.7–10.2)
Chloride: 100 mmol/L (ref 97–106)
Creatinine, Ser: 0.94 mg/dL (ref 0.76–1.27)
GFR calc Af Amer: 102 mL/min/{1.73_m2} (ref >59)
GFR calc non Af Amer: 88 mL/min/{1.73_m2} (ref >59)
Globulin, Total: 2.5 g/dL (ref 1.5–4.5)
Glucose: 106 mg/dL — ABNORMAL HIGH (ref 65–99)
Potassium: 4.2 mmol/L (ref 3.5–5.2)
Sodium: 141 mmol/L (ref 136–144)
Total Protein: 6.5 g/dL (ref 6.0–8.5)

## 2015-08-12 LAB — PSA, TOTAL AND FREE
PSA, Free Pct: 18.5 %
PSA, Free: 0.24 ng/mL
Prostate Specific Ag, Serum: 1.3 ng/mL (ref 0.0–4.0)

## 2015-08-12 LAB — HEPATITIS C ANTIBODY: Hep C Virus Ab: <0.1 s/co ratio (ref 0.0–0.9)

## 2015-08-12 LAB — VITAMIN D 25 HYDROXY (VIT D DEFICIENCY, FRACTURES): Vit D, 25-Hydroxy: 20.1 ng/mL — ABNORMAL LOW (ref 30.0–100.0)

## 2015-08-12 LAB — LIPID PANEL
Chol/HDL Ratio: 4.9 ratio units (ref 0.0–5.0)
Cholesterol, Total: 162 mg/dL (ref 100–199)
HDL: 33 mg/dL — ABNORMAL LOW (ref >39)
LDL Calculated: 87 mg/dL (ref 0–99)
Triglycerides: 210 mg/dL — ABNORMAL HIGH (ref 0–149)
VLDL Cholesterol Cal: 42 mg/dL — ABNORMAL HIGH (ref 5–40)

## 2015-08-12 LAB — HIV ANTIBODY (ROUTINE TESTING W REFLEX): HIV Screen 4th Generation wRfx: NONREACTIVE

## 2015-08-12 MED ORDER — VITAMIN D (ERGOCALCIFEROL) 1.25 MG (50000 UNIT) PO CAPS: 50000.0000 [IU] | ORAL_CAPSULE | ORAL | Status: DC

## 2015-08-13 LAB — FECAL OCCULT BLOOD, IMMUNOCHEMICAL: Fecal Occult Bld: NEGATIVE

## 2015-08-20 ENCOUNTER — Ambulatory Visit (HOSPITAL_COMMUNITY): Admission: RE | Admit: 2015-08-20 | Payer: Medicare Other | Source: Ambulatory Visit

## 2015-09-16 ENCOUNTER — Encounter: Payer: Self-pay | Admitting: *Deleted

## 2015-10-15 DIAGNOSIS — R918 Other nonspecific abnormal finding of lung field: Secondary | ICD-10-CM | POA: Diagnosis not present

## 2015-10-15 DIAGNOSIS — F209 Schizophrenia, unspecified: Secondary | ICD-10-CM | POA: Diagnosis not present

## 2015-10-15 DIAGNOSIS — Z915 Personal history of self-harm: Secondary | ICD-10-CM | POA: Diagnosis not present

## 2015-10-15 DIAGNOSIS — Z79899 Other long term (current) drug therapy: Secondary | ICD-10-CM | POA: Diagnosis not present

## 2015-10-15 DIAGNOSIS — F329 Major depressive disorder, single episode, unspecified: Secondary | ICD-10-CM | POA: Diagnosis not present

## 2015-10-15 DIAGNOSIS — R45851 Suicidal ideations: Secondary | ICD-10-CM | POA: Diagnosis not present

## 2015-10-15 DIAGNOSIS — Z87891 Personal history of nicotine dependence: Secondary | ICD-10-CM | POA: Diagnosis not present

## 2015-10-16 DIAGNOSIS — Z79899 Other long term (current) drug therapy: Secondary | ICD-10-CM | POA: Diagnosis not present

## 2015-10-16 DIAGNOSIS — F209 Schizophrenia, unspecified: Secondary | ICD-10-CM | POA: Diagnosis not present

## 2015-10-16 DIAGNOSIS — F17210 Nicotine dependence, cigarettes, uncomplicated: Secondary | ICD-10-CM | POA: Diagnosis present

## 2015-10-16 DIAGNOSIS — F250 Schizoaffective disorder, bipolar type: Secondary | ICD-10-CM | POA: Diagnosis not present

## 2015-10-16 DIAGNOSIS — R918 Other nonspecific abnormal finding of lung field: Secondary | ICD-10-CM | POA: Diagnosis present

## 2015-10-16 DIAGNOSIS — F329 Major depressive disorder, single episode, unspecified: Secondary | ICD-10-CM | POA: Diagnosis not present

## 2015-10-16 DIAGNOSIS — R45851 Suicidal ideations: Secondary | ICD-10-CM | POA: Diagnosis present

## 2015-10-16 DIAGNOSIS — Z915 Personal history of self-harm: Secondary | ICD-10-CM | POA: Diagnosis not present

## 2015-10-16 DIAGNOSIS — G629 Polyneuropathy, unspecified: Secondary | ICD-10-CM | POA: Diagnosis present

## 2015-10-28 DIAGNOSIS — R918 Other nonspecific abnormal finding of lung field: Secondary | ICD-10-CM | POA: Diagnosis not present

## 2015-11-07 ENCOUNTER — Other Ambulatory Visit: Payer: Self-pay | Admitting: Family Medicine

## 2015-11-23 DIAGNOSIS — F319 Bipolar disorder, unspecified: Secondary | ICD-10-CM | POA: Diagnosis not present

## 2016-01-04 ENCOUNTER — Other Ambulatory Visit: Payer: Self-pay | Admitting: Family Medicine

## 2016-02-12 ENCOUNTER — Emergency Department (HOSPITAL_COMMUNITY)
Admission: EM | Admit: 2016-02-12 | Discharge: 2016-02-12 | Disposition: A | Discharge status: Home / Self Care | Payer: Medicare Other | Attending: Emergency Medicine | Admitting: Emergency Medicine

## 2016-02-12 ENCOUNTER — Emergency Department (HOSPITAL_COMMUNITY): Payer: Medicare Other

## 2016-02-12 ENCOUNTER — Encounter (HOSPITAL_COMMUNITY): Payer: Self-pay | Admitting: Emergency Medicine

## 2016-02-12 DIAGNOSIS — Z87891 Personal history of nicotine dependence: Secondary | ICD-10-CM | POA: Insufficient documentation

## 2016-02-12 DIAGNOSIS — F209 Schizophrenia, unspecified: Secondary | ICD-10-CM | POA: Diagnosis not present

## 2016-02-12 DIAGNOSIS — R112 Nausea with vomiting, unspecified: Secondary | ICD-10-CM

## 2016-02-12 DIAGNOSIS — Z79899 Other long term (current) drug therapy: Secondary | ICD-10-CM | POA: Insufficient documentation

## 2016-02-12 DIAGNOSIS — R1030 Lower abdominal pain, unspecified: Secondary | ICD-10-CM | POA: Diagnosis not present

## 2016-02-12 DIAGNOSIS — F319 Bipolar disorder, unspecified: Secondary | ICD-10-CM | POA: Insufficient documentation

## 2016-02-12 DIAGNOSIS — R197 Diarrhea, unspecified: Secondary | ICD-10-CM | POA: Insufficient documentation

## 2016-02-12 DIAGNOSIS — R109 Unspecified abdominal pain: Secondary | ICD-10-CM | POA: Diagnosis not present

## 2016-02-12 LAB — CBC
HCT: 45.4 % (ref 39.0–52.0)
Hemoglobin: 15.4 g/dL (ref 13.0–17.0)
MCH: 32.6 pg (ref 26.0–34.0)
MCHC: 33.9 g/dL (ref 30.0–36.0)
MCV: 96.2 fL (ref 78.0–100.0)
Platelets: 188 10*3/uL (ref 150–400)
RBC: 4.72 MIL/uL (ref 4.22–5.81)
RDW: 13.2 % (ref 11.5–15.5)
WBC: 5.9 10*3/uL (ref 4.0–10.5)

## 2016-02-12 LAB — LIPASE, BLOOD: Lipase: 44 U/L (ref 11–51)

## 2016-02-12 LAB — COMPREHENSIVE METABOLIC PANEL WITH GFR
ALT: 12 U/L — ABNORMAL LOW (ref 17–63)
AST: 18 U/L (ref 15–41)
Albumin: 4.1 g/dL (ref 3.5–5.0)
Alkaline Phosphatase: 63 U/L (ref 38–126)
Anion gap: 4 — ABNORMAL LOW (ref 5–15)
BUN: 10 mg/dL (ref 6–20)
CO2: 29 mmol/L (ref 22–32)
Calcium: 9.1 mg/dL (ref 8.9–10.3)
Chloride: 101 mmol/L (ref 101–111)
Creatinine, Ser: 0.85 mg/dL (ref 0.61–1.24)
GFR calc Af Amer: >60 mL/min
GFR calc non Af Amer: >60 mL/min
Glucose, Bld: 120 mg/dL — ABNORMAL HIGH (ref 65–99)
Potassium: 3.4 mmol/L — ABNORMAL LOW (ref 3.5–5.1)
Sodium: 134 mmol/L — ABNORMAL LOW (ref 135–145)
Total Bilirubin: 1.4 mg/dL — ABNORMAL HIGH (ref 0.3–1.2)
Total Protein: 7.1 g/dL (ref 6.5–8.1)

## 2016-02-12 LAB — URINALYSIS, ROUTINE W REFLEX MICROSCOPIC
Bilirubin Urine: NEGATIVE
Glucose, UA: NEGATIVE mg/dL
Hgb urine dipstick: NEGATIVE
Ketones, ur: NEGATIVE mg/dL
Leukocytes, UA: NEGATIVE
Nitrite: NEGATIVE
Protein, ur: NEGATIVE mg/dL
Specific Gravity, Urine: 1.01 (ref 1.005–1.030)
pH: 7 (ref 5.0–8.0)

## 2016-02-12 LAB — C DIFFICILE QUICK SCREEN W PCR REFLEX
C Diff antigen: NEGATIVE
C Diff interpretation: NEGATIVE
C Diff toxin: NEGATIVE

## 2016-02-12 MED ORDER — IOPAMIDOL (ISOVUE-300) INJECTION 61%
100.0000 mL | Freq: Once | INTRAVENOUS | Status: AC | PRN
  Administered 2016-02-12: 100 mL via INTRAVENOUS

## 2016-02-12 MED ORDER — ONDANSETRON HCL 4 MG/2ML IJ SOLN
4.0000 mg | Freq: Once | INTRAMUSCULAR | Status: AC
  Administered 2016-02-12: 4 mg via INTRAVENOUS
  Filled 2016-02-12: qty 2

## 2016-02-12 MED ORDER — SODIUM CHLORIDE 0.9 % IV SOLN
Freq: Once | INTRAVENOUS | Status: AC
  Administered 2016-02-12: 12:00:00 via INTRAVENOUS

## 2016-02-12 MED ORDER — SODIUM CHLORIDE 0.9 % IV BOLUS (SEPSIS)
1000.0000 mL | Freq: Once | INTRAVENOUS | Status: AC
  Administered 2016-02-12: 1000 mL via INTRAVENOUS

## 2016-02-12 NOTE — Discharge Instructions (Signed)
Diarrhea Follow up with your doctor. Keep yourself hydrated. Return to the ED if you develop new or worsening symptoms. Diarrhea is frequent loose and watery bowel movements. It can cause you to feel weak and dehydrated. Dehydration can cause you to become tired and thirsty, have a dry mouth, and have decreased urination that often is dark yellow. Diarrhea is a sign of another problem, most often an infection that will not last long. In most cases, diarrhea typically lasts 2-3 days. However, it can last longer if it is a sign of something more serious. It is important to treat your diarrhea as directed by your caregiver to lessen or prevent future episodes of diarrhea. CAUSES  Some common causes include:  Gastrointestinal infections caused by viruses, bacteria, or parasites.  Food poisoning or food allergies.  Certain medicines, such as antibiotics, chemotherapy, and laxatives.  Artificial sweeteners and fructose.  Digestive disorders. HOME CARE INSTRUCTIONS  Ensure adequate fluid intake (hydration): Have 1 cup (8 oz) of fluid for each diarrhea episode. Avoid fluids that contain simple sugars or sports drinks, fruit juices, whole milk products, and sodas. Your urine should be clear or pale yellow if you are drinking enough fluids. Hydrate with an oral rehydration solution that you can purchase at pharmacies, retail stores, and online. You can prepare an oral rehydration solution at home by mixing the following ingredients together:   - tsp table salt.   tsp baking soda.   tsp salt substitute containing potassium chloride.  1  tablespoons sugar.  1 L (34 oz) of water.  Certain foods and beverages may increase the speed at which food moves through the gastrointestinal (GI) tract. These foods and beverages should be avoided and include:  Caffeinated and alcoholic beverages.  High-fiber foods, such as raw fruits and vegetables, nuts, seeds, and whole grain breads and cereals.  Foods  and beverages sweetened with sugar alcohols, such as xylitol, sorbitol, and mannitol.  Some foods may be well tolerated and may help thicken stool including:  Starchy foods, such as rice, toast, pasta, low-sugar cereal, oatmeal, grits, baked potatoes, crackers, and bagels.  Bananas.  Applesauce.  Add probiotic-rich foods to help increase healthy bacteria in the GI tract, such as yogurt and fermented milk products.  Wash your hands well after each diarrhea episode.  Only take over-the-counter or prescription medicines as directed by your caregiver.  Take a warm bath to relieve any burning or pain from frequent diarrhea episodes. SEEK IMMEDIATE MEDICAL CARE IF:   You are unable to keep fluids down.  You have persistent vomiting.  You have blood in your stool, or your stools are black and tarry.  You do not urinate in 6-8 hours, or there is only a small amount of very dark urine.  You have abdominal pain that increases or localizes.  You have weakness, dizziness, confusion, or light-headedness.  You have a severe headache.  Your diarrhea gets worse or does not get better.  You have a fever or persistent symptoms for more than 2-3 days.  You have a fever and your symptoms suddenly get worse. MAKE SURE YOU:   Understand these instructions.  Will watch your condition.  Will get help right away if you are not doing well or get worse.   This information is not intended to replace advice given to you by your health care provider. Make sure you discuss any questions you have with your health care provider.   Document Released: 09/09/2002 Document Revised: 10/10/2014 Document Reviewed: 05/27/2012  Elsevier Interactive Patient Education ©2016 [employer] ° °

## 2016-02-12 NOTE — ED Provider Notes (Signed)
CSN: [id number]     Arrival date & time 02/12/16  1117 History  By signing my name below, I, Sonum Patel, attest that this documentation has been prepared under the direction and in the presence of Ezequiel Essex, MD. Electronically Signed: Sonum Patel, Scribe. 02/12/2016. 12:09 PM.    Chief Complaint  Patient presents with  . Diarrhea    The history is provided by the patient. No language interpreter was used.   HPI Comments: Bruce Goodman is a 60 y.o. male who presents to the Emergency Department complaining of generalized fatigue for the past 6 days with associated subjective fever, nausea, vomiting, and diarrhea. He reports 5 episodes of vomiting and dysuria yesterday; 5 episodes of diarrhea daily for the past few days. He is currently nauseated. He reports ticks to the bilateral ankles that he removed 2 days ago. He denies sick contacts, recent antibiotic use, or recent travel. He denies hematochezia, HA. He denies any cardiac or pulmonary issues.   Past Medical History  Diagnosis Date  . Bipolar 1 disorder ([place])   . Depression   . Schizophrenia ([place])   . Lung nodule     left   Past Surgical History  Procedure Laterality Date  . Wrist surgery      right  . No past surgeries      rt wrist surg and carpal tunnel syndrome    Family History  Problem Relation Age of Onset  . COPD Sister    Social History  Substance Use Topics  . Smoking status: Former Smoker -- 0.50 packs/day for 10 years    Types: Cigarettes  . Smokeless tobacco: Never Used  . Alcohol Use: No    Review of Systems  A complete 10 system review of systems was obtained and all systems are negative except as noted in the HPI and PMH.    Allergies  Haldol and Seroquel  Home Medications   Prior to Admission medications   Medication Sig Start Date End Date Taking? Authorizing Provider  gabapentin (NEURONTIN) 800 MG tablet Take 800 mg by mouth 3 (three) times daily.   Yes Historical Provider, MD   mirtazapine (REMERON) 15 MG tablet Take 15 mg by mouth at bedtime.   Yes Historical Provider, MD  tiZANidine (ZANAFLEX) 4 MG tablet TAKE 1/2 TABLET 3 TIMES DAILY AS NEEDED 01/04/16  Yes Claretta Fraise, MD  traZODone (DESYREL) 100 MG tablet Take 100 mg by mouth at bedtime.    Yes Historical Provider, MD   BP 120/76 mmHg  Pulse 70  Temp(Src) 98 F (36.7 C) (Oral)  Resp 16  Ht 5\' 11"  (1.803 m)  Wt 170 lb (77.111 kg)  BMI 23.72 kg/m2  SpO2 100% Physical Exam  Constitutional: He is oriented to person, place, and time. He appears well-developed and well-nourished. No distress.  Disheveled appearing   HENT:  Head: Normocephalic and atraumatic.  Mouth/Throat: Oropharynx is clear and moist. No oropharyngeal exudate.  Eyes: Conjunctivae and EOM are normal. Pupils are equal, round, and reactive to light.  Neck: Normal range of motion. Neck supple.  No meningismus.  Cardiovascular: Normal rate, regular rhythm, normal heart sounds and intact distal pulses.   No murmur heard. Pulmonary/Chest: Effort normal and breath sounds normal. No respiratory distress.  Abdominal: Soft. There is tenderness ( Left sided tenderness ). There is no rebound and no guarding.  Genitourinary:  No testicular pain  Musculoskeletal: Normal range of motion. He exhibits no edema or tenderness.  Neurological: He is alert and  oriented to person, place, and time. No cranial nerve deficit. He exhibits normal muscle tone. Coordination normal.  No ataxia on finger to nose bilaterally. No pronator drift. 5/5 strength throughout. CN 2-12 intact.Equal grip strength. Sensation intact.   Skin: Skin is warm.  Multiple scab lesions to the lower extremities.  Psychiatric: He has a normal mood and affect. His behavior is normal.  Nursing note and vitals reviewed.   ED Course  Procedures (including critical care time)  DIAGNOSTIC STUDIES: Oxygen Saturation is 98% on RA, normal by my interpretation.    COORDINATION OF CARE: 12:17  PM Discussed treatment plan with pt at bedside and pt agreed to plan.   Labs Review Labs Reviewed  COMPREHENSIVE METABOLIC PANEL - Abnormal; Notable for the following:    Sodium 134 (*)    Potassium 3.4 (*)    Glucose, Bld 120 (*)    ALT 12 (*)    Total Bilirubin 1.4 (*)    Anion gap 4 (*)    All other components within normal limits  C DIFFICILE QUICK SCREEN W PCR REFLEX  LIPASE, BLOOD  CBC  URINALYSIS, ROUTINE W REFLEX MICROSCOPIC (NOT AT [facility])    Imaging Review No results found. I have personally reviewed and evaluated these images and lab results as part of my medical decision-making.   EKG Interpretation None      MDM   Final diagnoses:  Nausea vomiting and diarrhea   5 days of nausea vomiting and diarrhea with generalized weakness. No fever. No sick contacts or recent travel. No antibiotic use.  Labs unremarkable.  UA negative.  IVF given.  No vomiting in the ED. Patient states 2 loose stools. Did not save sample for C dif.   CT will be obtained given ongoing abdominal pain and diarrhea.  Pending at time of sign out to Dr. Regenia Skeeter.   I personally performed the services described in this documentation, which was scribed in my presence. The recorded information has been reviewed and is accurate.   Ezequiel Essex, MD 02/12/16 1700

## 2016-02-12 NOTE — ED Provider Notes (Signed)
5:59 PM CT is negative. No UTI to correlate with nonspecific bladder findings. Likely GI illness. D/c home  Results for orders placed or performed during the hospital encounter of 02/12/16  Lipase, blood  Result Value Ref Range   Lipase 44 11 - 51 U/L  Comprehensive metabolic panel  Result Value Ref Range   Sodium 134 (L) 135 - 145 mmol/L   Potassium 3.4 (L) 3.5 - 5.1 mmol/L   Chloride 101 101 - 111 mmol/L   CO2 29 22 - 32 mmol/L   Glucose, Bld 120 (H) 65 - 99 mg/dL   BUN 10 6 - 20 mg/dL   Creatinine, Ser 0.85 0.61 - 1.24 mg/dL   Calcium 9.1 8.9 - 10.3 mg/dL   Total Protein 7.1 6.5 - 8.1 g/dL   Albumin 4.1 3.5 - 5.0 g/dL   AST 18 15 - 41 U/L   ALT 12 (L) 17 - 63 U/L   Alkaline Phosphatase 63 38 - 126 U/L   Total Bilirubin 1.4 (H) 0.3 - 1.2 mg/dL   GFR calc non Af Amer >60 >60 mL/min   GFR calc Af Amer >60 >60 mL/min   Anion gap 4 (L) 5 - 15  CBC  Result Value Ref Range   WBC 5.9 4.0 - 10.5 K/uL   RBC 4.72 4.22 - 5.81 MIL/uL   Hemoglobin 15.4 13.0 - 17.0 g/dL   HCT 45.4 39.0 - 52.0 %   MCV 96.2 78.0 - 100.0 fL   MCH 32.6 26.0 - 34.0 pg   MCHC 33.9 30.0 - 36.0 g/dL   RDW 13.2 11.5 - 15.5 %   Platelets 188 150 - 400 K/uL  Urinalysis, Routine w reflex microscopic  Result Value Ref Range   Color, Urine YELLOW YELLOW   APPearance CLEAR CLEAR   Specific Gravity, Urine 1.010 1.005 - 1.030   pH 7.0 5.0 - 8.0   Glucose, UA NEGATIVE NEGATIVE mg/dL   Hgb urine dipstick NEGATIVE NEGATIVE   Bilirubin Urine NEGATIVE NEGATIVE   Ketones, ur NEGATIVE NEGATIVE mg/dL   Protein, ur NEGATIVE NEGATIVE mg/dL   Nitrite NEGATIVE NEGATIVE   Leukocytes, UA NEGATIVE NEGATIVE   Ct Abdomen Pelvis W Contrast  02/12/2016  CLINICAL DATA:  Nausea, vomiting, diarrhea for 5 days, pulled off ticks his legs 3 days ago, right lower abdominal pain EXAM: CT ABDOMEN AND PELVIS WITH CONTRAST TECHNIQUE: Multidetector CT imaging of the abdomen and pelvis was performed using the standard protocol following bolus  administration of intravenous contrast. CONTRAST:  136mL ISOVUE-300 IOPAMIDOL (ISOVUE-300) INJECTION 61% COMPARISON:  None. FINDINGS: Lower chest:  The lung bases are unremarkable. Hepatobiliary: Enhanced liver is unremarkable. No calcified gallstones are noted within gallbladder. No CBD dilatation. Pancreas: No mass, inflammatory changes, or other significant abnormality. Spleen: Within normal limits in size and appearance. Adrenals/Urinary Tract: No adrenal gland mass is noted. Enhanced kidneys are symmetrical in size. No hydronephrosis or hydroureter. Delayed renal images shows bilateral renal symmetrical excretion. Bilateral visualized proximal ureter is unremarkable. Subtle mild thickening of urinary bladder wall. Clinical correlation is necessary to exclude inflammation or mild cystitis. Stomach/Bowel: No gastric outlet obstruction. No small bowel obstruction. No thickened or dilated small bowel loops. The terminal ileum is unremarkable. No pericecal inflammation. Normal appendix clearly visualize in axial image 55. No colonic obstruction. Some colonic gas and contrast material noted in right colon and transverse colon. Contrast material noted in distal colon and rectum. No colonic obstruction. No thickening of colonic wall. No evidence of colitis or diverticulitis. Vascular/Lymphatic:  No aortic aneurysm. No retroperitoneal or mesenteric adenopathy. Reproductive: Prostate gland and seminal vesicles are unremarkable. No pelvic mass or adenopathy. Other: No ascites or free air. Small nonspecific bilateral inguinal lymph nodes are noted. Musculoskeletal: No destructive bony lesions are noted. Sagittal images of the spine shows mild degenerative changes lumbar spine. Significant disc space flattening with vacuum disc phenomenon and endplate sclerotic changes mild anterior and mild posterior spurring at L5-S1 level. Moderate anterior spurring upper endplate of L5. No destructive bony lesions are noted within  pelvis. IMPRESSION: 1. There is no evidence of acute inflammatory process within abdomen. 2. No pericecal inflammation.  Normal appendix. 3. No small bowel obstruction. 4. Nonspecific subtle mild thickening of urinary bladder wall. Mild inflammation or cystitis cannot be entirely excluded. Clinical correlation is necessary. 5. No colitis or diverticulitis.  No colonic obstruction. 6. Degenerative changes lumbar spine at L5-S1 level. Electronically Signed   By: Lahoma Crocker M.D.   On: 02/12/2016 17:37      Sherwood Gambler, MD 02/12/16 1759

## 2016-02-12 NOTE — ED Notes (Signed)
Pt has finished drinking po contrast for CT.

## 2016-02-12 NOTE — ED Notes (Signed)
Pt reports generalized weakness, nausea, vomiting and diarrhea x5 days.  Pt also reports pulling a few ticks off of his legs 3 days.  Pt alert and oriented.

## 2016-02-15 DIAGNOSIS — F319 Bipolar disorder, unspecified: Secondary | ICD-10-CM | POA: Diagnosis not present

## 2016-03-06 ENCOUNTER — Other Ambulatory Visit: Payer: Self-pay | Admitting: Family Medicine

## 2016-03-07 NOTE — Telephone Encounter (Signed)
Authorize 30 days only. Then contact the patient letting them know that they will need an appointment before any further prescriptions can be sent in. 

## 2016-03-07 NOTE — Telephone Encounter (Signed)
Last seen 08/10/15  Dr Livia Snellen

## 2016-03-08 NOTE — Telephone Encounter (Signed)
RX called into [facility] Pt notified 

## 2016-05-20 ENCOUNTER — Encounter (HOSPITAL_COMMUNITY): Payer: Self-pay | Admitting: Emergency Medicine

## 2016-05-20 ENCOUNTER — Emergency Department (HOSPITAL_COMMUNITY)
Admission: EM | Admit: 2016-05-20 | Discharge: 2016-05-21 | Disposition: A | Discharge status: Other Acute Inpatient Hospital | Payer: Medicare Other | Attending: Emergency Medicine | Admitting: Emergency Medicine

## 2016-05-20 DIAGNOSIS — Z87891 Personal history of nicotine dependence: Secondary | ICD-10-CM | POA: Insufficient documentation

## 2016-05-20 DIAGNOSIS — Z5181 Encounter for therapeutic drug level monitoring: Secondary | ICD-10-CM | POA: Insufficient documentation

## 2016-05-20 DIAGNOSIS — F329 Major depressive disorder, single episode, unspecified: Secondary | ICD-10-CM | POA: Diagnosis not present

## 2016-05-20 DIAGNOSIS — R45851 Suicidal ideations: Secondary | ICD-10-CM | POA: Diagnosis not present

## 2016-05-20 DIAGNOSIS — Z046 Encounter for general psychiatric examination, requested by authority: Secondary | ICD-10-CM | POA: Diagnosis present

## 2016-05-20 HISTORY — DX: Cannabis abuse, uncomplicated: F12.10

## 2016-05-20 HISTORY — DX: Opioid abuse, uncomplicated: F11.10

## 2016-05-20 LAB — COMPREHENSIVE METABOLIC PANEL
ALT: 9 U/L — ABNORMAL LOW (ref 17–63)
AST: 16 U/L (ref 15–41)
Albumin: 4.1 g/dL (ref 3.5–5.0)
Alkaline Phosphatase: 53 U/L (ref 38–126)
Anion gap: 6 (ref 5–15)
BUN: 6 mg/dL (ref 6–20)
CO2: 28 mmol/L (ref 22–32)
Calcium: 8.5 mg/dL — ABNORMAL LOW (ref 8.9–10.3)
Chloride: 104 mmol/L (ref 101–111)
Creatinine, Ser: 0.94 mg/dL (ref 0.61–1.24)
GFR calc Af Amer: >60 mL/min (ref >60)
GFR calc non Af Amer: >60 mL/min (ref >60)
Glucose, Bld: 102 mg/dL — ABNORMAL HIGH (ref 65–99)
Potassium: 3.7 mmol/L (ref 3.5–5.1)
Sodium: 138 mmol/L (ref 135–145)
Total Bilirubin: 0.9 mg/dL (ref 0.3–1.2)
Total Protein: 6.8 g/dL (ref 6.5–8.1)

## 2016-05-20 LAB — RAPID URINE DRUG SCREEN, HOSP PERFORMED
Amphetamines: NOT DETECTED
Barbiturates: NOT DETECTED
Benzodiazepines: NOT DETECTED
Cocaine: NOT DETECTED
Opiates: NOT DETECTED
Tetrahydrocannabinol: POSITIVE — AB

## 2016-05-20 LAB — CBC
HCT: 42.3 % (ref 39.0–52.0)
Hemoglobin: 14.3 g/dL (ref 13.0–17.0)
MCH: 32.3 pg (ref 26.0–34.0)
MCHC: 33.8 g/dL (ref 30.0–36.0)
MCV: 95.5 fL (ref 78.0–100.0)
Platelets: 203 10*3/uL (ref 150–400)
RBC: 4.43 MIL/uL (ref 4.22–5.81)
RDW: 13.4 % (ref 11.5–15.5)
WBC: 6.4 10*3/uL (ref 4.0–10.5)

## 2016-05-20 LAB — SALICYLATE LEVEL: Salicylate Lvl: <4 mg/dL (ref 2.8–30.0)

## 2016-05-20 LAB — ETHANOL: Alcohol, Ethyl (B): <5 mg/dL (ref <5)

## 2016-05-20 LAB — ACETAMINOPHEN LEVEL: Acetaminophen (Tylenol), Serum: <10 ug/mL — ABNORMAL LOW (ref 10–30)

## 2016-05-20 NOTE — ED Notes (Signed)
Pt is upset that whites are dating blacks. He has 2 mixed race grandchildren. He reports "some" of previous psych admissions with last he believes about 3 years ago in [place]. He has a plan to OD on his meds. He is unshaven, slight of build and somewhat odoriferous. He meets eye contact while speaking with RN.

## 2016-05-20 NOTE — ED Notes (Signed)
Meal tray given 

## 2016-05-20 NOTE — ED Notes (Signed)
Continues to await assessment from [facility]

## 2016-05-20 NOTE — ED Notes (Signed)
Awaiting [facility] assessment

## 2016-05-20 NOTE — ED Notes (Signed)
Pt awaiting TTS eval

## 2016-05-20 NOTE — ED Provider Notes (Signed)
[facility] DEPT Provider Note   CSN: [id number] Arrival date & time: 05/20/16  1632     History   Chief Complaint Chief Complaint  Patient presents with  . V70.1    HPI Bruce Goodman is a 60 y.o. male.  60 yo male with schizophrenia, bipolar presents with worsening depressive sxs and SI for the past few months.  Intermittent.  Multi-factorial - financial stress, lives in a tent all summer- hot/ bugs, social stressors in town.  No current plan.  Pt wants help.       Past Medical History:  Diagnosis Date  . Bipolar 1 disorder ([place])   . Depression   . Heroin abuse   . Lung nodule    left  . Marijuana abuse   . Schizophrenia [facility])     Patient Active Problem List   Diagnosis Date Noted  . Schizoaffective disorder, bipolar type ([place]) 05/21/2016  . Foot cramps 08/10/2015  . Lung nodule seen on imaging study 08/10/2015  . Cannabis abuse 06/29/2012    Class: Chronic  . Generalized anxiety disorder 06/29/2012    Class: Chronic    Past Surgical History:  Procedure Laterality Date  . NO PAST SURGERIES     rt wrist surg and carpal tunnel syndrome   . WRIST SURGERY     right       Home Medications    Prior to Admission medications   Medication Sig Start Date End Date Taking? Authorizing Provider  ARIPiprazole (ABILIFY) 5 MG tablet Take 1 tablet (5 mg total) by mouth daily. For mood control 05/23/16   Encarnacion Slates, NP  gabapentin (NEURONTIN) 300 MG capsule Take 1 capsule (300 mg total) by mouth 3 (three) times daily. For agitation 05/23/16   Encarnacion Slates, NP  hydrOXYzine (ATARAX/VISTARIL) 25 MG tablet Take 1 tablet (25 mg total) by mouth every 6 (six) hours as needed for anxiety. 05/23/16   Encarnacion Slates, NP  mirtazapine (REMERON) 15 MG tablet Take 1 tablet (15 mg total) by mouth at bedtime. For sleep/depression 05/23/16   Encarnacion Slates, NP  traZODone (DESYREL) 300 MG tablet Take 1 tablet (300 mg total) by mouth at bedtime. For sleep 05/23/16   Encarnacion Slates, NP     Family History Family History  Problem Relation Age of Onset  . COPD Sister     Social History Social History  Substance Use Topics  . Smoking status: Former Smoker    Packs/day: 0.50    Years: 10.00    Types: Cigarettes  . Smokeless tobacco: Never Used  . Alcohol use No     Allergies   Haldol [haloperidol decanoate] and Seroquel [quetiapine fumerate]   Review of Systems Review of Systems  Constitutional: Negative for chills and fever.  HENT: Negative for congestion.   Eyes: Negative for visual disturbance.  Respiratory: Negative for shortness of breath.   Cardiovascular: Negative for chest pain.  Gastrointestinal: Negative for abdominal pain and vomiting.  Genitourinary: Negative for dysuria and flank pain.  Musculoskeletal: Negative for back pain, neck pain and neck stiffness.  Skin: Negative for rash.  Neurological: Negative for light-headedness and headaches.  Psychiatric/Behavioral: Positive for dysphoric mood.     Physical Exam Updated Vital Signs BP 117/80 (BP Location: Right Arm)   Pulse 71   Temp 98.1 F (36.7 C) (Oral)   Resp 18   Ht 5\' 11"  (1.803 m)   Wt 170 lb (77.1 kg)   SpO2 98%   BMI 23.71  kg/m   Physical Exam  Constitutional: He is oriented to person, place, and time. He appears well-developed and well-nourished.  HENT:  Head: Normocephalic and atraumatic.  Eyes: Conjunctivae are normal. Right eye exhibits no discharge. Left eye exhibits no discharge.  Neck: Normal range of motion. Neck supple. No tracheal deviation present.  Cardiovascular: Normal rate and regular rhythm.   Pulmonary/Chest: Effort normal and breath sounds normal.  Abdominal: Soft. He exhibits no distension. There is no tenderness. There is no guarding.  Musculoskeletal: He exhibits no edema.  Neurological: He is alert and oriented to person, place, and time. No cranial nerve deficit.  Skin: Skin is warm. No rash noted.  Psychiatric: He exhibits a depressed mood. He  expresses suicidal ideation. He expresses no suicidal plans.  Nursing note and vitals reviewed.    ED Treatments / Results  Labs (all labs ordered are listed, but only abnormal results are displayed) Labs Reviewed  COMPREHENSIVE METABOLIC PANEL - Abnormal; Notable for the following:       Result Value   Glucose, Bld 102 (*)    Calcium 8.5 (*)    ALT 9 (*)    All other components within normal limits  ACETAMINOPHEN LEVEL - Abnormal; Notable for the following:    Acetaminophen (Tylenol), Serum <10 (*)    All other components within normal limits  URINE RAPID DRUG SCREEN, HOSP PERFORMED - Abnormal; Notable for the following:    Tetrahydrocannabinol POSITIVE (*)    All other components within normal limits  ETHANOL  SALICYLATE LEVEL  CBC    EKG  EKG Interpretation None       Radiology No results found.  Procedures Procedures (including critical care time)  Medications Ordered in ED Medications - No data to display   Initial Impression / Assessment and Plan / ED Course  I have reviewed the triage vital signs and the nursing notes.  Pertinent labs & imaging results that were available during my care of the patient were reviewed by me and considered in my medical decision making (see chart for details).  Clinical Course   Pt with depression and SI.  No current plan, voluntary.  Medically clear on my exam.   TTS assessment.    Final Clinical Impressions(s) / ED Diagnoses   Final diagnoses:  Suicidal ideation    New Prescriptions Discharge Medication List as of 05/21/2016  2:27 AM       Bruce Morrison, MD 05/28/16 [phone]

## 2016-05-20 NOTE — ED Triage Notes (Signed)
Pt states he has been having thoughts of wanting to hurt self.  States he is going to take an overdose of pills if he does not get some help with depression.

## 2016-05-21 ENCOUNTER — Inpatient Hospital Stay (HOSPITAL_COMMUNITY)
Admission: AD | Admit: 2016-05-21 | Discharge: 2016-05-23 | DRG: 885 | Disposition: A | Discharge status: Home / Self Care | Payer: Medicare Other | Source: Intra-hospital | Attending: Psychiatry | Admitting: Psychiatry

## 2016-05-21 ENCOUNTER — Encounter (HOSPITAL_COMMUNITY): Payer: Self-pay

## 2016-05-21 ENCOUNTER — Encounter (HOSPITAL_COMMUNITY): Payer: Self-pay | Admitting: Emergency Medicine

## 2016-05-21 DIAGNOSIS — F250 Schizoaffective disorder, bipolar type: Secondary | ICD-10-CM | POA: Diagnosis not present

## 2016-05-21 DIAGNOSIS — F339 Major depressive disorder, recurrent, unspecified: Secondary | ICD-10-CM | POA: Diagnosis present

## 2016-05-21 DIAGNOSIS — F329 Major depressive disorder, single episode, unspecified: Secondary | ICD-10-CM | POA: Diagnosis present

## 2016-05-21 DIAGNOSIS — Z825 Family history of asthma and other chronic lower respiratory diseases: Secondary | ICD-10-CM | POA: Diagnosis not present

## 2016-05-21 DIAGNOSIS — Z590 Homelessness: Secondary | ICD-10-CM

## 2016-05-21 DIAGNOSIS — Z818 Family history of other mental and behavioral disorders: Secondary | ICD-10-CM

## 2016-05-21 DIAGNOSIS — F411 Generalized anxiety disorder: Secondary | ICD-10-CM | POA: Diagnosis present

## 2016-05-21 DIAGNOSIS — G4700 Insomnia, unspecified: Secondary | ICD-10-CM | POA: Diagnosis present

## 2016-05-21 DIAGNOSIS — Z5181 Encounter for therapeutic drug level monitoring: Secondary | ICD-10-CM | POA: Diagnosis not present

## 2016-05-21 DIAGNOSIS — Z87891 Personal history of nicotine dependence: Secondary | ICD-10-CM | POA: Diagnosis not present

## 2016-05-21 DIAGNOSIS — F1210 Cannabis abuse, uncomplicated: Secondary | ICD-10-CM | POA: Diagnosis present

## 2016-05-21 DIAGNOSIS — R45851 Suicidal ideations: Secondary | ICD-10-CM | POA: Diagnosis present

## 2016-05-21 DIAGNOSIS — F315 Bipolar disorder, current episode depressed, severe, with psychotic features: Secondary | ICD-10-CM | POA: Diagnosis present

## 2016-05-21 MED ORDER — MIRTAZAPINE 15 MG PO TABS
15.0000 mg | ORAL_TABLET | Freq: Every day | ORAL | Status: DC
  Administered 2016-05-21: 15 mg via ORAL
  Filled 2016-05-21 (×3): qty 1

## 2016-05-21 MED ORDER — TRAZODONE HCL 150 MG PO TABS
300.0000 mg | ORAL_TABLET | Freq: Every day | ORAL | Status: DC
  Administered 2016-05-21 – 2016-05-22 (×2): 300 mg via ORAL
  Filled 2016-05-21 (×5): qty 2
  Filled 2016-05-21: qty 14

## 2016-05-21 MED ORDER — HYDROXYZINE HCL 25 MG PO TABS
25.0000 mg | ORAL_TABLET | Freq: Four times a day (QID) | ORAL | Status: DC | PRN
  Administered 2016-05-21 – 2016-05-23 (×2): 25 mg via ORAL
  Filled 2016-05-21 (×2): qty 1
  Filled 2016-05-21: qty 10

## 2016-05-21 MED ORDER — LORAZEPAM 1 MG PO TABS: 1.0000 mg | ORAL_TABLET | Freq: Three times a day (TID) | ORAL | Status: DC | PRN

## 2016-05-21 MED ORDER — ACETAMINOPHEN 325 MG PO TABS: 650.0000 mg | ORAL_TABLET | ORAL | Status: DC | PRN

## 2016-05-21 MED ORDER — MIRTAZAPINE 15 MG PO TBDP
ORAL_TABLET | ORAL | Status: AC
  Filled 2016-05-21: qty 1

## 2016-05-21 MED ORDER — NICOTINE 14 MG/24HR TD PT24: 14.0000 mg | MEDICATED_PATCH | Freq: Every day | TRANSDERMAL | Status: DC

## 2016-05-21 MED ORDER — TRAZODONE HCL 50 MG PO TABS
100.0000 mg | ORAL_TABLET | Freq: Every day | ORAL | Status: DC
  Administered 2016-05-21: 100 mg via ORAL
  Filled 2016-05-21: qty 2

## 2016-05-21 MED ORDER — GABAPENTIN 800 MG PO TABS
800.0000 mg | ORAL_TABLET | Freq: Every day | ORAL | Status: DC
  Administered 2016-05-21: 800 mg via ORAL
  Filled 2016-05-21 (×3): qty 1

## 2016-05-21 MED ORDER — ARIPIPRAZOLE 5 MG PO TABS
5.0000 mg | ORAL_TABLET | Freq: Every day | ORAL | Status: DC
  Administered 2016-05-21 – 2016-05-23 (×3): 5 mg via ORAL
  Filled 2016-05-21 (×2): qty 1
  Filled 2016-05-21: qty 7
  Filled 2016-05-21 (×4): qty 1

## 2016-05-21 MED ORDER — IBUPROFEN 400 MG PO TABS: 600.0000 mg | ORAL_TABLET | Freq: Three times a day (TID) | ORAL | Status: DC | PRN

## 2016-05-21 MED ORDER — ONDANSETRON HCL 4 MG PO TABS: 4.0000 mg | ORAL_TABLET | Freq: Three times a day (TID) | ORAL | Status: DC | PRN

## 2016-05-21 MED ORDER — ALUM & MAG HYDROXIDE-SIMETH 200-200-20 MG/5ML PO SUSP: 30.0000 mL | ORAL | Status: DC | PRN

## 2016-05-21 MED ORDER — GABAPENTIN 400 MG PO CAPS
ORAL_CAPSULE | ORAL | Status: AC
  Filled 2016-05-21: qty 2

## 2016-05-21 MED ORDER — MAGNESIUM HYDROXIDE 400 MG/5ML PO SUSP: 30.0000 mL | Freq: Every day | ORAL | Status: DC | PRN

## 2016-05-21 MED ORDER — ZOLPIDEM TARTRATE 5 MG PO TABS: 5.0000 mg | ORAL_TABLET | Freq: Every evening | ORAL | Status: DC | PRN

## 2016-05-21 MED ORDER — ACETAMINOPHEN 325 MG PO TABS
650.0000 mg | ORAL_TABLET | Freq: Four times a day (QID) | ORAL | Status: DC | PRN
  Administered 2016-05-23: 650 mg via ORAL
  Filled 2016-05-21 (×2): qty 2

## 2016-05-21 NOTE — Tx Team (Addendum)
Initial Interdisciplinary Treatment Plan   PATIENT STRESSORS: Educational concerns Marital or family conflict Medication change or noncompliance   PATIENT STRENGTHS: Ability for insight Active sense of humor General fund of knowledge Motivation for treatment/growth   PROBLEM LIST: Problem List/Patient Goals Date to be addressed Date deferred Reason deferred Estimated date of resolution  depression 05/21/2016     anxiety 05/21/2016     Risk for suicide 05/21/2016     "how to relax and get all the anger out of me and have fun without drugs" 05/21/2016                                    DISCHARGE CRITERIA:  Ability to meet basic life and health needs Adequate post-discharge living arrangements Improved stabilization in mood, thinking, and/or behavior Medical problems require only outpatient monitoring Motivation to continue treatment in a less acute level of care Verbal commitment to aftercare and medication compliance  PRELIMINARY DISCHARGE PLAN: Attend aftercare/continuing care group Attend PHP/IOP Participate in family therapy Placement in alternative living arrangements  PATIENT/FAMIILY INVOLVEMENT: This treatment plan has been presented to and reviewed with the patient, Bruce Goodman, and/or family member.  The patient and family have been given the opportunity to ask questions and make suggestions.  Bruce Goodman, Bruce Goodman 05/21/2016, 4:38 AM

## 2016-05-21 NOTE — ED Notes (Signed)
Call to [facility] regarding when pt will be assessed- will be aiming for 20 minutes until assessment

## 2016-05-21 NOTE — [facility] Group Notes (Signed)
[facility] Group Notes:  (Clinical Social Work)  05/21/2016    11:00AM-12:00PM  Summary of Progress/Problems:   The main focus of today's process group was to explore in depth the perceived benefits and costs of unhealthy coping techniques, as well as the  benefits and costs of replacing that with a healthy coping skills.  After talking about the possible benefits and definite costs of patients' specific unhealthy coping techniques, the patients were given the opportunity to write a goodbye letter to the unhealthy coping of their choice.  Some patients shared their letters with the group.  It was a tearful group with a lot of mutual support.  The patient expressed a) healthy and b) unhealthy coping techniques including a) writing music and b) smoking heroin.  He did not want to talk in group, he stated, but was very alert and listening.  He did write a letter at the end of group but did not want to share it, said he has a hard time with spelling.  Type of Therapy:  Group Therapy - Process   Participation Level:  Active  Participation Quality:  Attentive  Affect:  Blunted and Depressed  Cognitive:  Appropriate  Insight:  Developing/Improving  Engagement in Therapy:  Engaged  Modes of Intervention:  Exploration, Activity  Selmer Dominion, LCSW 05/21/2016, 1:43 PM

## 2016-05-21 NOTE — Progress Notes (Signed)
Patient ID: Bruce Goodman, male   DOB: 20-Nov-1955, 60 y.o.   MRN: [id number] PER STATE REGULATIONS 482.30  THIS CHART WAS REVIEWED FOR MEDICAL NECESSITY WITH RESPECT TO THE PATIENT'S ADMISSION/DURATION OF STAY.  NEXT REVIEW DATE:05/25/16  Roma Schanz, RN, BSN CASE MANAGER
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
## 2016-05-21 NOTE — ED Provider Notes (Signed)
Patient has been accepted at [facility] by Dr. Sindy Messing.     Delora Fuel, [place] [id number]
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
## 2016-05-21 NOTE — ED Notes (Signed)
Pt has signed his voluntary paperwork, awaiting transfer

## 2016-05-21 NOTE — ED Notes (Signed)
Pt left with [facility] enroute to [facility]- Call to Vaughan Basta, RN for update

## 2016-05-21 NOTE — Progress Notes (Signed)
Patient ID: Bruce Goodman, male   DOB: 04-23-1956, 60 y.o.   MRN: [id number]   Pt currently presents with a flat affect and depressed behavior. Pt reports to writer that their goal is to "sleep tonight." Pt states "I stay with my daughter sometimes but I don't mind it in the tent, it wasn't too bad of a winter last winter ." Pt reports "okay" sleep with current medication regimen.   Pt provided with medications per providers orders. Pt's labs and vitals were monitored throughout the night. Pt supported emotionally and encouraged to express concerns and questions. Pt educated on medications.  Pt's safety ensured with 15 minute and environmental checks. Pt currently denies SI/HI and A/V hallucinations. Pt verbally agrees to seek staff if SI/HI or A/VH occurs and to consult with staff before acting on any harmful thoughts. Pt attends wrap up group, minimal interaction with peers. Will continue POC.

## 2016-05-21 NOTE — BHH Group Notes (Signed)
[facility] Group Notes:  (Nursing/MHT/Case Management/Adjunct)  Date:  05/21/2016  Time:  6:35 PM  Type of Therapy:  Nurse Education  Participation Level:  Did Not Attend   Cheri Kearns 05/21/2016, 6:35 PM

## 2016-05-21 NOTE — Progress Notes (Signed)
Data. Patient reports feeling SI today, along with, "Anger at the world", denies HI/AVH. Patient is able to verbally contract for safety on the unit and to come to staff if suicidal thoughts become overwhelming and patient feels unsafe. Patient has been wandering the halls and spending time in his room, with minimal interaction with peers or staff. Patient interacting well with staff and other patients. On his self assessment patient reports 10/10 for depression, 7/10 for hopelessness and 9/10 for anxiety.  Patient has no goal today. Action. Emotional support and encouragement offered. Education provided on medication, indications and side effect. Q 15 minute checks done for safety. Response. Safety on the unit maintained through 15 minute checks.  Medications taken as prescribed. Attended groups. Remained calm and appropriate through out shift.
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
## 2016-05-22 MED ORDER — GABAPENTIN 800 MG PO TABS
800.0000 mg | ORAL_TABLET | Freq: Every day | ORAL | Status: DC
  Administered 2016-05-22 – 2016-05-23 (×2): 800 mg via ORAL
  Filled 2016-05-22 (×3): qty 1

## 2016-05-22 MED ORDER — MIRTAZAPINE 15 MG PO TABS
15.0000 mg | ORAL_TABLET | Freq: Every day | ORAL | Status: DC
  Administered 2016-05-22: 15 mg via ORAL
  Filled 2016-05-22: qty 1
  Filled 2016-05-22: qty 7
  Filled 2016-05-22 (×2): qty 1

## 2016-05-22 MED ORDER — ENSURE ENLIVE PO LIQD
237.0000 mL | Freq: Two times a day (BID) | ORAL | Status: DC
  Administered 2016-05-22 – 2016-05-23 (×4): 237 mL via ORAL

## 2016-05-22 MED ORDER — NICOTINE 14 MG/24HR TD PT24
14.0000 mg | MEDICATED_PATCH | Freq: Every day | TRANSDERMAL | Status: DC
  Filled 2016-05-22 (×2): qty 1

## 2016-05-22 NOTE — Progress Notes (Signed)
[facility] MD Progress Note  05/22/2016 10:35 AM Bruce Goodman  MRN:  [id number] Subjective:  I need my two medications back , my Remeron and Gabapentin. I had a lot of thinking last night. I woke up at 3 but I took my medicine at 9. I hadnt slep in days so even 6 hours is good. I m going back to [employer] , I like to read and that was very relaxing for me. I have my [place] documents but I dont remember the medications or the year I served. "   Objective: [name] 60 year old presented voluntarily to [place] [facility], with c/o of increased anxiety, crying and suicidal ideations. Reports his increasing stressors include housing, daughters being of disappointment one in jail for drug charges, other daughter is using drugs, and conflict with dad. Patient  has been compliant with his medication and inpatient psychiatric program including milieu therapy and group therapy. Patient  has a disturbance of sleep that was improved with Trazodone being resumed and appetite remains poor at this time. He is willing to drink Ensure to supplement nutrition at this time. Patient has rates depression  3/10 and anxiety 0/10 and denies suicidal ideation without intention or plan at this time. Patient has stressed out about his daughters and dad not being in good communication with him.  Patient stated that he is feeling safer in the hospital contract for safety while in the hospital. Reports that he continues to attend/participate in group which is helping him, and his goal today is to read and prepare for discharge.  States that he was able to think long and hard yesterday and he is ready to return to his life again.   At this time patient denies suicidal/self harming thoughts an psychosis.   Principal Problem: Schizoaffective disorder, bipolar type ([place]) Diagnosis:   Patient Active Problem List   Diagnosis Date Noted  . Schizoaffective disorder, bipolar type ([place]) [F25.0] 05/21/2016  . Foot cramps [R25.2] 08/10/2015  . Lung nodule  seen on imaging study [R91.1] 08/10/2015  . Cannabis abuse [F12.10] 06/29/2012    Class: Chronic  . Generalized anxiety disorder [F41.1] 06/29/2012    Class: Chronic   Total Time spent with patient: 45 minutes  Past Psychiatric History:  Past Medical History:  Past Medical History:  Diagnosis Date  . Bipolar 1 disorder ([place])   . Depression   . Heroin abuse   . Lung nodule    left  . Marijuana abuse   . Schizophrenia [facility])     Past Surgical History:  Procedure Laterality Date  . NO PAST SURGERIES     rt wrist surg and carpal tunnel syndrome   . WRIST SURGERY     right   Family History:  Family History  Problem Relation Age of Onset  . COPD Sister    Family Psychiatric  History: Substance and alcohol abuse  Social History:  History  Alcohol Use No     History  Drug Use  . Types: Marijuana    Comment: heroin on occasion    Social History   Social History  . Marital status: Divorced    Spouse name: N/A  . Number of children: N/A  . Years of education: N/A   Social History Main Topics  . Smoking status: Former Smoker    Packs/day: 0.50    Years: 10.00    Types: Cigarettes  . Smokeless tobacco: Never Used  . Alcohol use No  . Drug use:  Types: Marijuana     Comment: heroin on occasion  . Sexual activity: No   Other Topics Concern  . None   Social History Narrative  . None   Additional Social History:      Sleep: Good  Appetite:  Fair  Current Medications: Current Facility-Administered Medications  Medication Dose Route Frequency Provider Last Rate Last Dose  . acetaminophen (TYLENOL) tablet 650 mg  650 mg Oral Q6H PRN Dara Hoyer, PA-C      . alum & mag hydroxide-simeth (MAALOX/MYLANTA) 200-200-20 MG/5ML suspension 30 mL  30 mL Oral Q4H PRN Dara Hoyer, PA-C      . ARIPiprazole (ABILIFY) tablet 5 mg  5 mg Oral Daily Nanci Pina, FNP   5 mg at 05/22/16 0831  . feeding supplement (ENSURE ENLIVE) (ENSURE ENLIVE) liquid 237 mL   237 mL Oral BID BM Myer Peer Cobos, MD   237 mL at 05/22/16 1001  . hydrOXYzine (ATARAX/VISTARIL) tablet 25 mg  25 mg Oral Q6H PRN Nanci Pina, FNP   25 mg at 05/21/16 2102  . magnesium hydroxide (MILK OF MAGNESIA) suspension 30 mL  30 mL Oral Daily PRN Dara Hoyer, PA-C      . traZODone (DESYREL) tablet 300 mg  300 mg Oral QHS Nanci Pina, FNP   300 mg at 05/21/16 2101    Lab Results:  Results for orders placed or performed during the hospital encounter of 05/20/16 (from the past 48 hour(s))  Comprehensive metabolic panel     Status: Abnormal   Collection Time: 05/20/16  5:25 PM  Result Value Ref Range   Sodium 138 135 - 145 mmol/L   Potassium 3.7 3.5 - 5.1 mmol/L   Chloride 104 101 - 111 mmol/L   CO2 28 22 - 32 mmol/L   Glucose, Bld 102 (H) 65 - 99 mg/dL   BUN 6 6 - 20 mg/dL   Creatinine, Ser 0.94 0.61 - 1.24 mg/dL   Calcium 8.5 (L) 8.9 - 10.3 mg/dL   Total Protein 6.8 6.5 - 8.1 g/dL   Albumin 4.1 3.5 - 5.0 g/dL   AST 16 15 - 41 U/L   ALT 9 (L) 17 - 63 U/L   Alkaline Phosphatase 53 38 - 126 U/L   Total Bilirubin 0.9 0.3 - 1.2 mg/dL   GFR calc non Af Amer >60 >60 mL/min   GFR calc Af Amer >60 >60 mL/min    Comment: (NOTE) The eGFR has been calculated using the CKD EPI equation. This calculation has not been validated in all clinical situations. eGFR's persistently <60 mL/min signify possible Chronic Kidney Disease.    Anion gap 6 5 - 15  Ethanol     Status: None   Collection Time: 05/20/16  5:25 PM  Result Value Ref Range   Alcohol, Ethyl (B) <5 <5 mg/dL    Comment:        LOWEST DETECTABLE LIMIT FOR SERUM ALCOHOL IS 5 mg/dL FOR MEDICAL PURPOSES ONLY   Salicylate level     Status: None   Collection Time: 05/20/16  5:25 PM  Result Value Ref Range   Salicylate Lvl <0.9 2.8 - 30.0 mg/dL  Acetaminophen level     Status: Abnormal   Collection Time: 05/20/16  5:25 PM  Result Value Ref Range   Acetaminophen (Tylenol), Serum <10 (L) 10 - 30 ug/mL    Comment:         THERAPEUTIC CONCENTRATIONS VARY SIGNIFICANTLY. A RANGE OF 10-30 ug/mL  MAY BE AN EFFECTIVE CONCENTRATION FOR MANY PATIENTS. HOWEVER, SOME ARE BEST TREATED AT CONCENTRATIONS OUTSIDE THIS RANGE. ACETAMINOPHEN CONCENTRATIONS >150 ug/mL AT 4 HOURS AFTER INGESTION AND >50 ug/mL AT 12 HOURS AFTER INGESTION ARE OFTEN ASSOCIATED WITH TOXIC REACTIONS.   cbc     Status: None   Collection Time: 05/20/16  5:25 PM  Result Value Ref Range   WBC 6.4 4.0 - 10.5 K/uL   RBC 4.43 4.22 - 5.81 MIL/uL   Hemoglobin 14.3 13.0 - 17.0 g/dL   HCT 42.3 39.0 - 52.0 %   MCV 95.5 78.0 - 100.0 fL   MCH 32.3 26.0 - 34.0 pg   MCHC 33.8 30.0 - 36.0 g/dL   RDW 13.4 11.5 - 15.5 %   Platelets 203 150 - 400 K/uL  Rapid urine drug screen (hospital performed)     Status: Abnormal   Collection Time: 05/20/16  7:10 PM  Result Value Ref Range   Opiates NONE DETECTED NONE DETECTED   Cocaine NONE DETECTED NONE DETECTED   Benzodiazepines NONE DETECTED NONE DETECTED   Amphetamines NONE DETECTED NONE DETECTED   Tetrahydrocannabinol POSITIVE (A) NONE DETECTED   Barbiturates NONE DETECTED NONE DETECTED    Comment:        DRUG SCREEN FOR MEDICAL PURPOSES ONLY.  IF CONFIRMATION IS NEEDED FOR ANY PURPOSE, NOTIFY LAB WITHIN 5 DAYS.        LOWEST DETECTABLE LIMITS FOR URINE DRUG SCREEN Drug Class       Cutoff (ng/mL) Amphetamine      1000 Barbiturate      200 Benzodiazepine   416 Tricyclics       606 Opiates          300 Cocaine          300 THC              50     Blood Alcohol level:  Lab Results  Component Value Date   ETH <5 05/20/2016   ETH <11 30/16/0109    Metabolic Disorder Labs: Lab Results  Component Value Date   HGBA1C 5.3 12/16/2013   MPG 114 10/21/2012   No results found for: PROLACTIN Lab Results  Component Value Date   CHOL 162 08/10/2015   TRIG 210 (H) 08/10/2015   HDL 33 (L) 08/10/2015   CHOLHDL 4.9 08/10/2015   VLDL 29 10/21/2012   LDLCALC 87 08/10/2015   LDLCALC 155 (H)  12/16/2013    Physical Findings: AIMS: Facial and Oral Movements Muscles of Facial Expression: None, normal Lips and Perioral Area: None, normal Jaw: None, normal Tongue: None, normal,Extremity Movements Upper (arms, wrists, hands, fingers): None, normal Lower (legs, knees, ankles, toes): None, normal, Trunk Movements Neck, shoulders, hips: None, normal, Overall Severity Severity of abnormal movements (highest score from questions above): None, normal Incapacitation due to abnormal movements: None, normal Patient's awareness of abnormal movements (rate only patient's report): No Awareness, Dental Status Current problems with teeth and/or dentures?: Yes (no teeth) Does patient usually wear dentures?: No  CIWA:    COWS:     Musculoskeletal: Strength & Muscle Tone: within normal limits Gait & Station: normal Patient leans: N/A  Psychiatric Specialty Exam: Physical Exam  ROS  Blood pressure 107/69, pulse (!) 104, temperature 98 F (36.7 C), resp. rate 16, height 5' 10.08" (1.78 m), weight 70.8 kg (156 lb).Body mass index is 22.33 kg/m.  General Appearance: Fairly Groomed  Eye Contact:  Fair  Speech:  Clear and Coherent and Normal Rate  Volume:  Normal  Mood:  Depressed  Affect:  Appropriate and Congruent  Thought Process:  Coherent and Goal Directed  Orientation:  Full (Time, Place, and Person)  Thought Content:  WDL  Suicidal Thoughts:  No  Homicidal Thoughts:  No  Memory:  Immediate;   Fair Recent;   Fair  Judgement:  Intact  Insight:  Present  Psychomotor Activity:  Normal  Concentration:  Concentration: Fair and Attention Span: Fair  Recall:  [employer] of Knowledge:  Fair  Language:  Fair  Akathisia:  No  Handed:  Right  AIMS (if indicated):     Assets:  Communication Skills Desire for Improvement Financial Resources/Insurance Leisure Time Physical Health Social Support Talents/Skills  ADL's:  Intact  Cognition:  WNL  Sleep:  Number of Hours: 4.25    Treatment Plan Summary: Daily contact with patient to assess and evaluate symptoms and progress in treatment and Medication management Plan: Review of chart, vital signs, medications, and notes.  1-Admit for crisis management and stabilization. Estimated length of stay 5-7 days past his current stay of 1  2-Individual and group therapy encouraged  3-Medication management for depression and anxiety to reduce current symptoms to base line and improve the patient's overall level of functioning: Medications reviewed with the patient and Trazodone 356m added for sleep issues and Vistaril 25 mg every six hours PRN anxiety. Will start Abilify 517mpo daily for depression, agitation, anger and suicidal thoughts. He also reports taking Remeron for sleep and depression. These rx were verified and will be resumed as well as gabapentin. Gabapentin 80069mo daily. Remeron 54m28m qhs 4-Coping skills for depression, substance abuse, anger issues, and anxiety developing--  5-Continue crisis stabilization and management  6-Address health issues--monitoring vital signs, stable  7-Treatment plan in progress to prevent relapse of depression, angry outbursts, and anxiety  8-Psychosocial education regarding relapse prevention and self-care  9-Health care follow up as needed for any health concerns  10-Call for consult with hospitalist for additional specialty patient services as needed. TakiNanci PinaP 05/22/2016, 10:35 AM

## 2016-05-22 NOTE — Progress Notes (Signed)
D: Patient up and visible in the milieu. Spoke with patient 1:1. Rates sleep fair, appetite fair, energy high and concentration good. Patient's affect anxious, mood congruent mood. Rating her depression at a 3/10, hopelessness at a 1/10 and anxiety at a 4/10. States goal for today is to "work on myself." Denies pain, physical problems.   A: Medicated per orders, no prn meds given or requested. Emotional support offered and self inventory reviewed. Fall precautions reviewed and in place.   R: Patient verbalizes understanding. Patient denies SI/HI and remains safe on level III obs.

## 2016-05-22 NOTE — BHH Group Notes (Signed)
[facility] Group Notes:  (Clinical Social Work)   @TODAY @   1:15-2:15PM  Summary of Progress/Problems:   The main focus of today's process group was to                        1)  Discuss the importance of adding supports                       2)  Identify various supports that could be added for various needs                       3)  Talk about barriers to using supports  An emphasis was placed on using counselor, doctor, therapy groups, 12-step groups, and problem-specific support groups to expand supports.  Much detail was given by CSW, but mostly by other patients, on each of these types of professional supports.  The patient expressed full comprehension of the concepts presented, and agreed that there is a need to add more supports.  The patient asked to have the "definition" of schizophrenia shared with him.  After group he stated he enjoyed the groups, had been in a lot of facilities and usually did not interact.  He laughed and joked and shared fully in group today.  Type of Therapy:  Process Group with Motivational Interviewing  Participation Level:  Active  Participation Quality:  Attentive  Affect:  Appropriate  Cognitive:  Appropriate  Insight:  Developing/Improving  Engagement in Therapy:  Engaged  Modes of Intervention:   Education, Support and Processing, Activity  [employer], LCSW @TODAY @   4:34 PM
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
## 2016-05-22 NOTE — Progress Notes (Signed)
[place] Group Notes:  (Nursing/MHT/Case Management/Adjunct)  Date:  05/22/2016  Time:  10:12 PM  Type of Therapy:  Psychoeducational Skills  Participation Level:  Active  Participation Quality:  Appropriate  Affect:  Appropriate  Cognitive:  Appropriate  Insight:  Improving  Engagement in Group:  Improving  Modes of Intervention:  Education  Summary of Progress/Problems: Patient states that he had a "pretty good" day overall. The patient explained with a brighter affect that he enjoyed talking to the male nursing student from [employer] He states that he does not have a support system at this time (theme of the day).   [name] S 05/22/2016, 10:12 PM

## 2016-05-22 NOTE — BHH Group Notes (Signed)
[facility] Group Notes:  (Nursing/MHT/Case Management/Adjunct)  Date:  05/22/2016  Time:  1030  Type of Therapy:  Nurse Education - Healthy Support Systems  Participation Level:  Active  Participation Quality:  Attentive  Affect:  Blunted  Cognitive:  Alert  Insight:  Limited  Engagement in Group:  Engaged  Modes of Intervention:  Discussion, Education and Support  Summary of Progress/Problems: Patient attended and participated in group therapy.  Loletta Specter [facility] 05/22/2016, 1100

## 2016-05-22 NOTE — Progress Notes (Signed)
Psychoeducational Group Note  Date:  05/22/2016 Time:  0055  Group Topic/Focus:  Wrap-Up Group:   The focus of this group is to help patients review their daily goal of treatment and discuss progress on daily workbooks.   Participation Level: Did Not Attend  Participation Quality:  Not Applicable  Affect:  Not Applicable  Cognitive:  Not Applicable  Insight:  Not Applicable  Engagement in Group: Not Applicable  Additional Comments:  The patient did not attend last evening's wrap-up group.    Marcques Wrightsman S 05/22/2016, 12:55 AM

## 2016-05-22 NOTE — Progress Notes (Signed)
NUTRITION ASSESSMENT  Pt identified as at risk on the Malnutrition Screen Tool  INTERVENTION: 1. Supplements: Ensure Enlive po BID, each supplement provides 350 kcal and 20 grams of protein  NUTRITION DIAGNOSIS: Unintentional weight loss related to sub-optimal intake as evidenced by pt report.   Goal: Pt to meet >/= 90% of their estimated nutrition needs.  Monitor:  PO intake  Assessment:  Pt admitted with anxiety and depression. Pt with history of substance abuse. Pt reports poor appetite and not eating for 3-4 weeks. Per weight history, pt has lost 14 lb since 5/12 (8% wt loss x 3 months, significant for time frame). Pt would benefit from nutritional supplementation. RD to order.  Height: Ht Readings from Last 1 Encounters:  05/21/16 5' 10.08" (1.78 m)    Weight: Wt Readings from Last 1 Encounters:  05/21/16 156 lb (70.8 kg)    Weight Hx: Wt Readings from Last 10 Encounters:  05/21/16 156 lb (70.8 kg)  05/20/16 170 lb (77.1 kg)  02/12/16 170 lb (77.1 kg)  08/10/15 170 lb 6.4 oz (77.3 kg)  12/16/13 198 lb (89.8 kg)  11/08/13 186 lb 4.8 oz (84.5 kg)  08/25/13 173 lb (78.5 kg)  12/20/12 172 lb (78 kg)  11/04/12 172 lb (78 kg)  10/19/12 162 lb (73.5 kg)    BMI:  Body mass index is 22.33 kg/m. Pt meets criteria for normal range based on current BMI.  Estimated Nutritional Needs: Kcal: 25-30 kcal/kg Protein: > 1 gram protein/kg Fluid: 1 ml/kcal  Diet Order: Diet regular Room service appropriate? Yes; Fluid consistency: Thin Pt is also offered choice of unit snacks mid-morning and mid-afternoon.  Pt is eating as desired.   Lab results and medications reviewed.   Clayton Bibles, MS, RD, LDN Pager: [phone] After Hours Pager: [phone]

## 2016-05-23 MED ORDER — TRAZODONE HCL 300 MG PO TABS: 300.0000 mg | ORAL_TABLET | Freq: Every day | ORAL | 0 refills | Status: DC

## 2016-05-23 MED ORDER — GABAPENTIN 300 MG PO CAPS
300.0000 mg | ORAL_CAPSULE | Freq: Three times a day (TID) | ORAL | Status: DC
  Administered 2016-05-23: 300 mg via ORAL
  Filled 2016-05-23: qty 21
  Filled 2016-05-23 (×3): qty 1
  Filled 2016-05-23: qty 21
  Filled 2016-05-23 (×2): qty 1
  Filled 2016-05-23: qty 21
  Filled 2016-05-23: qty 1

## 2016-05-23 MED ORDER — HYDROXYZINE HCL 25 MG PO TABS: 25.0000 mg | ORAL_TABLET | Freq: Four times a day (QID) | ORAL | 0 refills | Status: DC | PRN

## 2016-05-23 MED ORDER — GABAPENTIN 300 MG PO CAPS: 300.0000 mg | ORAL_CAPSULE | Freq: Three times a day (TID) | ORAL | 0 refills | Status: DC

## 2016-05-23 MED ORDER — GABAPENTIN 600 MG PO TABS
300.0000 mg | ORAL_TABLET | Freq: Three times a day (TID) | ORAL | Status: DC
  Filled 2016-05-23 (×3): qty 0.5

## 2016-05-23 MED ORDER — ONDANSETRON HCL 4 MG PO TABS
4.0000 mg | ORAL_TABLET | Freq: Three times a day (TID) | ORAL | Status: DC | PRN
  Administered 2016-05-23: 4 mg via ORAL
  Filled 2016-05-23: qty 1

## 2016-05-23 MED ORDER — MIRTAZAPINE 15 MG PO TABS: 15.0000 mg | ORAL_TABLET | Freq: Every day | ORAL | 0 refills | Status: DC

## 2016-05-23 MED ORDER — ARIPIPRAZOLE 5 MG PO TABS: 5.0000 mg | ORAL_TABLET | Freq: Every day | ORAL | 0 refills | Status: DC

## 2016-05-23 NOTE — Progress Notes (Signed)
D: Patient visible on day room and hallway. Denies pain, SI, AH/VH at this time. Patient made no new complaint.  A: Staff offered support and encouragement as needed. Due meds given as ordered. Every 15 minutes check for safety maintained. Will continue to monitor patient for safety and stability. R: Patient remains safe. Sleeping at this time.

## 2016-05-23 NOTE — BHH Suicide Risk Assessment (Signed)
[facility] INPATIENT:  Family/Significant Other Suicide Prevention Education  Suicide Prevention Education:  Contact Attempts: Bruce Goodman, Pt's daughter [phone], has been identified by the patient as the family member/significant other with whom the patient will be residing, and identified as the person(s) who will aid the patient in the event of a mental health crisis.  With written consent from the patient, two attempts were made to provide suicide prevention education, prior to and/or following the patient's discharge.  We were unsuccessful in providing suicide prevention education.  A suicide education pamphlet was given to the patient to share with family/significant other.  Date and time of first attempt:05/23/16 @ 3:45pm Date and time of second attempt: 05/23/16 @ 4:50pm  Bruce Goodman 05/23/2016, 3:04 PM

## 2016-05-23 NOTE — Progress Notes (Signed)
Recreation Therapy Notes  Date: 05/23/16 Time: 0930 Location: 300 Hall Group Room  Group Topic: Stress Management  Goal Area(s) Addresses:  Patient will verbalize importance of using healthy stress management.  Patient will identify positive emotions associated with healthy stress management.   Intervention: Stress Management  Activity :  Progressive Muscle Relaxation.  LRT introduced the stress management technique of progressive muscle relaxation to the patients.  Patients were to follow along as LRT read script to engaged in the technique.  Education:  Stress Management, Discharge Planning.   Education Outcome: Acknowledges edcuation/In group clarification offered/Needs additional education  Clinical Observations/Feedback: Pt did not attend group.   Victorino Sparrow, LRT/CTRS   Ria Comment, Loden Laurent A 05/23/2016 1:08 PM

## 2016-05-23 NOTE — Discharge Summary (Signed)
Physician Discharge Summary Note  Patient:  Bruce Goodman is an 60 y.o., male  MRN:  [id number]  DOB:  July 31, 1956  Patient phone:  [phone] (home)   Patient address:   [place] [address],   Total Time spent with patient: Greater than 30 minutes  Date of Admission:  05/21/2016  Date of Discharge: 05-23-16  Reason for Admission:  Worsening symptoms of anxiety/depression.  Principal Problem: Schizoaffective disorder, bipolar type [facility])  Discharge Diagnoses: Patient Active Problem List   Diagnosis Date Noted  . Schizoaffective disorder, bipolar type ([place]) [F25.0] 05/21/2016  . Foot cramps [R25.2] 08/10/2015  . Lung nodule seen on imaging study [R91.1] 08/10/2015  . Cannabis abuse [F12.10] 06/29/2012    Class: Chronic  . Generalized anxiety disorder [F41.1] 06/29/2012    Class: Chronic   Past Psychiatric History: Major depression.  Past Medical History:  Past Medical History:  Diagnosis Date  . Bipolar 1 disorder ([place])   . Depression   . Heroin abuse   . Lung nodule    left  . Marijuana abuse   . Schizophrenia [facility])     Past Surgical History:  Procedure Laterality Date  . NO PAST SURGERIES     rt wrist surg and carpal tunnel syndrome   . WRIST SURGERY     right   Family History:  Family History  Problem Relation Age of Onset  . COPD Sister    Family Psychiatric  History: Familial hx of substance abuse, Schizoaffective disorder  Social History:  History  Alcohol Use No     History  Drug Use  . Types: Marijuana    Comment: heroin on occasion    Social History   Social History  . Marital status: Divorced    Spouse name: N/A  . Number of children: N/A  . Years of education: N/A   Social History Main Topics  . Smoking status: Former Smoker    Packs/day: 0.50    Years: 10.00    Types: Cigarettes  . Smokeless tobacco: Never Used  . Alcohol use No  . Drug use:     Types: Marijuana     Comment: heroin on occasion  .  Sexual activity: No   Other Topics Concern  . None   Social History Narrative  . None   Hospital Course: [name] I have a rough life. Need something to calm my nerves and anger. My dad and my daughter. I had a hard time sleeping for 4 months, more so in the past 3 weeks. I cant seem to shake myself. Depression , anxiety, nervous breakdown. My dad is a professional drunk, he is gay, a child molester, and he murdered my mom with his mouth. My other daughter is in prison shooting up and has hepatitis. My oldest daughter snort pain pills. I was living with my daughter but I couldn't watch her do drugs anymore. I need some serious help and intense therapy. I can't afford and cant travel for it. Live [place] my mom's farm. My mom died, and dad lives in my moms house".  Bruce Goodman was admitted to the [facility] adult unit with complaints of worsening symptoms of depression, anger issues & anxiety. He cited familial stressors as the trigger. He was in need of mood stabilization treatments. During the course of his treatment, Bruce Goodman was medicated & discharged on, Abilify 5 mg for mood control, Hydroxyzine 25 mg prn for anxiety, Gabapentin 300 mg for agitation, Mirtazapine 15 mg for  depression/insomnia & Trazodone 300 mg insomnia. He was enrolled & participated in the group counseling sessions being offered & held on this unit. He was counseled & learned coping skills that should help him cope better & maintain mood stability after discharge. Bruce Goodman presented no other previously existing medical issues that required treatments. He tolerated his treatment regimen without any adverse effects reported.   While his treatment was on going, Bruce Goodman's improvement was monitored by observation & his daily reports of symptom reduction noted.  His emotional & mental status were monitored by daily self-inventory reports completed by him & the clinical staff. Bruce Goodman was evaluated daily by the treatment team for mood stability  & the need for continued recovery after discharge. His motivation was an integral factor in his recovery & mood stability. He was offered further treatment options upon discharge & will follow up with the outpatient psychiatric services as listed below.     Upon discharge, Bruce Goodman was both mentally & medically stable for discharge. He is currently denying suicidal, homicidal ideation, auditory, visual/tactile hallucinations, delusional thoughts & or paranoia. He was provided with a 7 days worth, supply samples of his [facility] discharge medications. Bruce Goodman left [facility] with all personal belongings in no apparent distress. Transportation per [employer] arranged transportation.  Physical Findings: AIMS: Facial and Oral Movements Muscles of Facial Expression: None, normal Lips and Perioral Area: None, normal Jaw: None, normal Tongue: None, normal,Extremity Movements Upper (arms, wrists, hands, fingers): None, normal Lower (legs, knees, ankles, toes): None, normal, Trunk Movements Neck, shoulders, hips: None, normal, Overall Severity Severity of abnormal movements (highest score from questions above): None, normal Incapacitation due to abnormal movements: None, normal Patient's awareness of abnormal movements (rate only patient's report): No Awareness, Dental Status Current problems with teeth and/or dentures?: Yes Does patient usually wear dentures?: No  CIWA:    COWS:     Musculoskeletal: Strength & Muscle Tone: within normal limits Gait & Station: normal Patient leans: N/A  Psychiatric Specialty Exam: Physical Exam  Constitutional: He is oriented to person, place, and time. He appears well-developed and well-nourished.  HENT:  Head: Normocephalic.  Eyes: Pupils are equal, round, and reactive to light.  Neck: Normal range of motion.  Cardiovascular: Normal rate.   Respiratory: Effort normal.  GI: Soft.  Genitourinary:  Genitourinary Comments: Denies any issues in this area  Musculoskeletal:  Normal range of motion.  Neurological: He is alert and oriented to person, place, and time.  Skin: Skin is warm.    Review of Systems  Constitutional: Negative.   HENT: Negative.   Eyes: Negative.   Respiratory: Negative.   Cardiovascular: Negative.   Gastrointestinal: Negative.   Genitourinary: Negative.   Musculoskeletal: Negative.   Skin: Negative.   Neurological: Negative.   Endo/Heme/Allergies: Negative.   Psychiatric/Behavioral: Positive for depression (Stable) and substance abuse (Hx, Cannabis abuse). Negative for hallucinations, memory loss and suicidal ideas. The patient has insomnia (Stable). The patient is not nervous/anxious.     Blood pressure 110/68, pulse 94, temperature 97.9 F (36.6 C), temperature source Oral, resp. rate 16, height 5' 10.08" (1.78 m), weight 70.8 kg (156 lb).Body mass index is 22.33 kg/m.  See Md's SRA   Have you used any form of tobacco in the last 30 days? (Cigarettes, Smokeless Tobacco, Cigars, and/or Pipes): Yes  Has this patient used any form of tobacco in the last 30 days? (Cigarettes, Smokeless Tobacco, Cigars, and/or Pipes): No  Blood Alcohol level:  Lab Results  Component Value Date   ETH <  5 05/20/2016   ETH <11 [id number]   Metabolic Disorder Labs:  Lab Results  Component Value Date   HGBA1C 5.3 12/16/2013   MPG 114 10/21/2012   No results found for: PROLACTIN Lab Results  Component Value Date   CHOL 162 08/10/2015   TRIG 210 (H) 08/10/2015   HDL 33 (L) 08/10/2015   CHOLHDL 4.9 08/10/2015   VLDL 29 10/21/2012   LDLCALC 87 08/10/2015   LDLCALC 155 (H) 12/16/2013   See Psychiatric Specialty Exam and Suicide Risk Assessment completed by Attending Physician prior to discharge.  Discharge destination:  Home  Is patient on multiple antipsychotic therapies at discharge:  No   Has Patient had three or more failed trials of antipsychotic monotherapy by history:  No  Recommended Plan for Multiple Antipsychotic  Therapies: NA    Medication List    STOP taking these medications   gabapentin 800 MG tablet Commonly known as:  NEURONTIN Replaced by:  gabapentin 300 MG capsule     TAKE these medications     Indication  ARIPiprazole 5 MG tablet Commonly known as:  ABILIFY Take 1 tablet (5 mg total) by mouth daily. For mood control  Indication:  Mood control   gabapentin 300 MG capsule Commonly known as:  NEURONTIN Take 1 capsule (300 mg total) by mouth 3 (three) times daily. For agitation Replaces:  gabapentin 800 MG tablet  Indication:  Agitation   hydrOXYzine 25 MG tablet Commonly known as:  ATARAX/VISTARIL Take 1 tablet (25 mg total) by mouth every 6 (six) hours as needed for anxiety.  Indication:  Anxiety Neurosis   mirtazapine 15 MG tablet Commonly known as:  REMERON Take 1 tablet (15 mg total) by mouth at bedtime. For sleep/depression What changed:  additional instructions  Indication:  Trouble Sleeping, Major Depressive Disorder   trazodone 300 MG tablet Commonly known as:  DESYREL Take 1 tablet (300 mg total) by mouth at bedtime. For sleep What changed:  medication strength  how much to take  additional instructions  Indication:  Trouble Sleeping      Follow-up Information    [facility] Follow up on 05/24/2016.   Why:  Please walk-in between 8am-11am for your hospital discharge appointment. Please bring your hospital paperwork with you. Contact information: [address] [place], [place] [address] Phone: [phone] Fax: [phone]        Follow-up recommendations: Activity:  As tolerated Diet: As recommended by your primary care doctor. Keep all scheduled follow-up appointments as recommended.   Comments: Patient is instructed prior to discharge to: Take all medications as prescribed by his/her mental healthcare provider. Report any adverse effects and or reactions from the medicines to his/her outpatient provider promptly. Patient has been  instructed & cautioned: To not engage in alcohol and or illegal drug use while on prescription medicines. In the event of worsening symptoms, patient is instructed to call the crisis hotline, 911 and or go to the nearest ED for appropriate evaluation and treatment of symptoms. To follow-up with his/her primary care provider for your other medical issues, concerns and or health care needs.   Signed: Encarnacion Slates, NP, PMHNP, FNP-BC 05/25/2016, 1:20 PM  Patient seen, Suicide Assessment Completed.  Disposition Plan Reviewed

## 2016-05-23 NOTE — Progress Notes (Signed)
Data. Patient denies SI/HI/AVH. Patient interacting well with staff and other patients. On his self assessment patient reports 3/10 for anxiety and depression and 1/10 for hopelessness. He did not have a goal today, however, she does state that he is looking forwoed to leaving th facility and going, "home". He did request samples of meds, as he was unsure if his insurance would pay for his new antidepressant. Action. Emotional support and encouragement offered. Education provided on medication, indications and side effect. Q 15 minute checks done for safety. Response. Safety on the unit maintained through 15 minute checks.  Medications taken as prescribed. Attended groups. Remained calm and appropriate through out shift.  Pt. discharged to lobby.  Transport provided by [employer]. Belongings sheet reviewed and signed by pt. and all belongings, including scripts and medication samples, sent home. Paperwork reviewed and pt. able to verbalize understanding of education. Pt. in no current distress and ambulatory.

## 2016-05-23 NOTE — Progress Notes (Signed)
  [facility] Adult Case Management Discharge Plan :  Will you be returning to the same living situation after discharge:  Yes,  Pt returning to his tent  At discharge, do you have transportation home?: Yes,  Medicaid transportation arranged Do you have the ability to pay for your medications: Yes,  Pt provided with prescriptions  Release of information consent forms completed and in the chart;  Patient's signature needed at discharge.  Patient to Follow up at: Follow-up Information    [facility] Follow up on 05/24/2016.   Why:  Please walk-in between 8am-11am for your hospital discharge appointment. Please bring your hospital paperwork with you. Contact information: [address] [place], [place] [address] Phone: [phone] Fax: [phone]          Next level of care provider has access to [place] and Suicide Prevention discussed: Yes,  with Pt; two unsuccessful attempts made with daughter  Have you used any form of tobacco in the last 30 days? (Cigarettes, Smokeless Tobacco, Cigars, and/or Pipes): Yes  Has patient been referred to the Quitline?: Patient refused referral  Patient has been referred for addiction treatment: Yes  Elery Cadenhead Kandis Cocking 05/23/2016, 3:04 PM

## 2016-05-23 NOTE — Tx Team (Signed)
Interdisciplinary Treatment Plan Update (Adult) Date: 05/23/2016   Date: 05/23/2016 9:34 AM  Progress in Treatment:  Attending groups: Yes  Participating in groups: Yes  Taking medication as prescribed: Yes  Tolerating medication: Yes  Family/Significant othe contact made: No, CSW attempting to make contact with daughter Patient understands diagnosis: Yes AEB seeking help with depression Discussing patient identified problems/goals with staff: Yes  Medical problems stabilized or resolved: Yes  Denies suicidal/homicidal ideation: Yes Patient has not harmed self or Others: Yes   New problem(s) identified: None identified at this time.   Discharge Plan or Barriers: CSW will assess for appropriate discharge plan and relevant barriers.   Additional comments:  Patient and CSW reviewed pt's identified goals and treatment plan. Patient verbalized understanding and agreed to treatment plan.   Reason for Continuation of Hospitalization:  Depression Medication stabilization Suicidal ideation  Estimated length of stay: 2-3 days  Review of initial/current patient goals per problem list:   1.  Goal(s): Patient will participate in aftercare plan  Met:  No  Target date: 3-5 days from date of admission   As evidenced by: Patient will participate within aftercare plan AEB aftercare provider and housing plan at discharge being identified.  05/23/16: CSW to work with Pt to assess for appropriate discharge plan and faciliate appointments and referrals as needed prior to d/c.  2.  Goal (s): Patient will exhibit decreased depressive symptoms and suicidal ideations.  Met:  Yes  Target date: 3-5 days from date of admission   As evidenced by: Patient will utilize self rating of depression at 3 or below and demonstrate decreased signs of depression or be deemed stable for discharge by MD.  05/23/16: Pt rates depression at 3/10; denies SI  Attendees:  Patient:    Family:    Physician: Dr.  Parke Poisson, MD  05/23/2016 9:34 AM  Nursing: Darrol Angel, RN; Leanne Lovely, RN 05/23/2016 9:34 AM  Clinical Social Worker Peri Maris, [place] 05/23/2016 9:34 AM  Other: Tilden Fossa, LCSWA 05/23/2016 9:34 AM  Clinical: Lars Pinks, RN Case manager  05/23/2016 9:34 AM  Other:  05/23/2016 9:34 AM  Other:     Peri Maris, [place] Work [phone]

## 2016-05-23 NOTE — [facility]HH Suicide Risk Assessment (Addendum)
[facility] Discharge Suicide Risk Assessment   Principal Problem: Schizoaffective disorder, bipolar type [facility]) Discharge Diagnoses:  Patient Active Problem List   Diagnosis Date Noted  . Schizoaffective disorder, bipolar type ([place]) [F25.0] 05/21/2016  . Foot cramps [R25.2] 08/10/2015  . Lung nodule seen on imaging study [R91.1] 08/10/2015  . Cannabis abuse [F12.10] 06/29/2012    Class: Chronic  . Generalized anxiety disorder [F41.1] 06/29/2012    Class: Chronic    Total Time spent with patient: 30 minutes  Musculoskeletal: Strength & Muscle Tone: within normal limits Gait & Station: normal Patient leans: N/A  Psychiatric Specialty Exam: ROS denies headache, no chest pain, no shortness of breath, no vomiting   Blood pressure 110/68, pulse 94, temperature 97.9 F (36.6 C), temperature source Oral, resp. rate 16, height 5' 10.08" (1.78 m), weight 156 lb (70.8 kg).Body mass index is 22.33 kg/m.  General Appearance: Fairly Groomed  Engineer, water::  Good  Speech:  Normal Rate409  Volume:  Normal  Mood:  states he is feeling better, presents euthymic at this time   Affect:  Appropriate and reactive   Thought Process:  Linear  Orientation:  Full (Time, Place, and Person)  Thought Content:  no hallucinations, no delusions, not internally preoccupied   Suicidal Thoughts:  No- at this time denies suicidal ideations, denies any self injurious ideations  Homicidal Thoughts:  No- denies any homicidal or violent ideations   Memory:  recent and remote grossly intact   Judgement:  Other:  improved   Insight:  improved   Psychomotor Activity:  Normal  Concentration:  Good  Recall:  Good  Fund of Knowledge:Good  Language: Negative  Akathisia:  Negative  Handed:  Right  AIMS (if indicated):     Assets:  Desire for Improvement Resilience  Sleep:  Number of Hours: 6.5  Cognition: WNL  ADL's:  Intact   Mental Status Per Nursing Assessment::   On Admission:     Demographic Factors:  60  year old  divorced male, homeless   Loss Factors: Disability, homelessness   Historical Factors: Depression  , prior psychiatric admissions, history of suicide attempts.   Risk Reduction Factors:   Positive coping skills or problem solving skills  Continued Clinical Symptoms:  At this time patient is alert, attentive, calm, pleasant, and presenting with a full range of affect, describes his mood as "OK" and today presents euthymic, no thought disorder, no suicidal ideations, no hallucinations, no delusions,not internally preoccupied . Denies medication side effects and reports he feels medications are helpful. We decided to switch Neurontin from QHS dosing to TID dosing.  Of note, patient is on a high dose of Trazodone - states that he tolerates it well, denies side effects, and states that lower doses not as effective, denies medication side effects or any serotonergic symptoms , side effects at this time - I cautioned him about potential medication side effects and sedation .  Cognitive Features That Contribute To Risk:  No gross cognitive deficits noted upon discharge. Is alert , attentive, and oriented x 3    Suicide Risk:  Mild:  Suicidal ideation of limited frequency, intensity, duration, and specificity.  There are no identifiable plans, no associated intent, mild dysphoria and related symptoms, good self-control (both objective and subjective assessment), few other risk factors, and identifiable protective factors, including available and accessible social support.    Plan Of Care/Follow-up recommendations:  Activity:  aqs tolerated  Diet:  Regular Tests:  NA Other:  See below n Patient  is requesting discharge and there are no current grounds for involuntary commitment . Patient states he plans to return to live in a tent " like I always do in the [place] ", and states that in the fall he will move in to live with a family member . States he has a PCP in [place] he plans to  follow up with for medical issues as needed . Neita Garnet, MD 05/23/2016, 1:03 PM

## 2016-05-24 DIAGNOSIS — F319 Bipolar disorder, unspecified: Secondary | ICD-10-CM | POA: Diagnosis not present

## 2016-08-10 ENCOUNTER — Encounter: Payer: Medicare Other | Admitting: Family Medicine

## 2016-08-15 DIAGNOSIS — F319 Bipolar disorder, unspecified: Secondary | ICD-10-CM | POA: Diagnosis not present

## 2017-04-21 ENCOUNTER — Encounter: Payer: Medicare Other | Admitting: Family Medicine

## 2017-06-19 ENCOUNTER — Encounter: Payer: Medicare Other | Admitting: Family Medicine

## 2017-07-17 ENCOUNTER — Encounter: Payer: Medicare Other | Admitting: Family Medicine

## 2017-07-19 ENCOUNTER — Encounter: Payer: Self-pay | Admitting: Family Medicine

## 2017-07-19 ENCOUNTER — Ambulatory Visit (INDEPENDENT_AMBULATORY_CARE_PROVIDER_SITE_OTHER): Payer: Medicare Other | Admitting: Family Medicine

## 2017-07-19 VITALS — BP 117/64 | HR 93 | Temp 98.5°F | Ht 70.08 in | Wt 153.0 lb

## 2017-07-19 DIAGNOSIS — F1123 Opioid dependence with withdrawal: Secondary | ICD-10-CM | POA: Diagnosis not present

## 2017-07-19 DIAGNOSIS — M10072 Idiopathic gout, left ankle and foot: Secondary | ICD-10-CM

## 2017-07-19 DIAGNOSIS — F1193 Opioid use, unspecified with withdrawal: Secondary | ICD-10-CM

## 2017-07-19 MED ORDER — COLCHICINE 0.6 MG PO TABS: ORAL_TABLET | ORAL | 0 refills | Status: DC

## 2017-07-19 MED ORDER — MIRTAZAPINE 15 MG PO TABS: 15.0000 mg | ORAL_TABLET | Freq: Every day | ORAL | 0 refills | Status: DC

## 2017-07-19 MED ORDER — DIPHENOXYLATE-ATROPINE 2.5-0.025 MG PO TABS: ORAL_TABLET | ORAL | 0 refills | Status: DC

## 2017-07-19 MED ORDER — TRAZODONE HCL 300 MG PO TABS: 300.0000 mg | ORAL_TABLET | Freq: Every day | ORAL | 0 refills | Status: DC

## 2017-07-19 NOTE — Progress Notes (Signed)
Subjective:  Patient ID: GIROLAMO LORTIE, male    DOB: [birth year]  Age: 61 y.o. MRN: [id number]  CC: Diarrhea (pt here today c/o diarrhea for the past few weeks)   HPI Andros L Scribner presents for Left first toe pain for several weeks off and on. Getting worse. Left toe is painful to touch and swollen. With regard to diarrhea he states that he has had it off and on for 2 weeks 1-3 loose bowel movements a day. He also has been weaned off of Suboxone and Nucynta. His last dose of either opiate was 2 weeks ago. He is seen at a pain clinic in [place] but does not want to return there. He does not want to go to rehabilitation he does not want to go to detox. He just wants something to relieve the current symptom.  Depression screen [facility] 2/9 07/19/2017 08/10/2015  Decreased Interest 0 0  Down, Depressed, Hopeless 0 0  PHQ - 2 Score 0 0    History Siddhanth has a past medical history of Bipolar 1 disorder ([place]); Depression; Heroin abuse ([place]); Lung nodule; Marijuana abuse; and Schizophrenia ([place]).   He has a past surgical history that includes Wrist surgery and No past surgeries.   His family history includes COPD in his sister.He reports that he has quit smoking. His smoking use included Cigarettes. He has a 5.00 pack-year smoking history. He has never used smokeless tobacco. He reports that he uses drugs, including Marijuana. He reports that he does not drink alcohol.    ROS Review of Systems  Constitutional: Positive for chills and diaphoresis. Negative for fever and unexpected weight change.  HENT: Negative for congestion, hearing loss, rhinorrhea and sore throat.   Eyes: Negative for visual disturbance.  Respiratory: Negative for cough and shortness of breath.   Cardiovascular: Negative for chest pain.  Gastrointestinal: Negative for abdominal pain, constipation and diarrhea.  Genitourinary: Negative for dysuria and flank pain.  Musculoskeletal: Positive for arthralgias (Left  great toe). Negative for joint swelling.  Skin: Negative for rash.  Neurological: Positive for tremors and weakness (all over). Negative for dizziness and headaches.  Psychiatric/Behavioral: Negative for dysphoric mood and sleep disturbance.    Objective:  BP 117/64   Pulse 93   Temp 98.5 F (36.9 C) (Oral)   Ht 5' 10.08" (1.78 m)   Wt 153 lb (69.4 kg)   BMI 21.90 kg/m   BP Readings from Last 3 Encounters:  07/19/17 117/64  05/21/16 117/80  02/12/16 124/81    Wt Readings from Last 3 Encounters:  07/19/17 153 lb (69.4 kg)  05/20/16 170 lb (77.1 kg)  02/12/16 170 lb (77.1 kg)     Physical Exam  Constitutional: He appears well-developed and well-nourished.  HENT:  Head: Normocephalic and atraumatic.  Right Ear: Tympanic membrane and external ear normal. No decreased hearing is noted.  Left Ear: Tympanic membrane and external ear normal. No decreased hearing is noted.  Mouth/Throat: No oropharyngeal exudate or posterior oropharyngeal erythema.  Eyes: Pupils are equal, round, and reactive to light.  Neck: Normal range of motion. Neck supple.  Cardiovascular: Normal rate and regular rhythm.   No murmur heard. Pulmonary/Chest: Breath sounds normal. No respiratory distress.  Abdominal: Soft. Bowel sounds are normal. He exhibits no mass. There is no tenderness.  Skin: Skin is warm and dry.  Psychiatric: His mood appears anxious. His speech is rapid and/or pressured. He is agitated. Thought content is paranoid. Cognition and memory are normal. He expresses impulsivity.  Vitals reviewed.     Assessment & Plan:   Jerran was seen today for diarrhea.  Diagnoses and all orders for this visit:  Opiate withdrawal ([place])  Acute idiopathic gout involving toe of left foot  Other orders -     colchicine 0.6 MG tablet; Take 1 tablet PO every 2 hours x 4 and then one every AM. -     diphenoxylate-atropine (LOMOTIL) 2.5-0.025 MG tablet; Take 1-2 tablets by mouth every 6 hours as  needed for diarrhea. -     mirtazapine (REMERON) 15 MG tablet; Take 1 tablet (15 mg total) by mouth at bedtime. For sleep/depression -     trazodone (DESYREL) 300 MG tablet; Take 1 tablet (300 mg total) by mouth at bedtime. For sleep       I have discontinued Mr. Rodriguez ARIPiprazole, gabapentin, and hydrOXYzine. I am also having him start on colchicine and diphenoxylate-atropine. Additionally, I am having him maintain his mirtazapine and trazodone.  Allergies as of 07/19/2017      Reactions   Haldol [haloperidol Decanoate] Other (See Comments)   Alters mental status   Seroquel [quetiapine Fumerate] Other (See Comments)   UNKNOWN REACTION      Medication List       Accurate as of 07/19/17 10:58 AM. Always use your most recent med list.          colchicine 0.6 MG tablet Take 1 tablet PO every 2 hours x 4 and then one every AM.   diphenoxylate-atropine 2.5-0.025 MG tablet Commonly known as:  LOMOTIL Take 1-2 tablets by mouth every 6 hours as needed for diarrhea.   mirtazapine 15 MG tablet Commonly known as:  REMERON Take 1 tablet (15 mg total) by mouth at bedtime. For sleep/depression   trazodone 300 MG tablet Commonly known as:  DESYREL Take 1 tablet (300 mg total) by mouth at bedtime. For sleep      After extended discussion patient is willing to try detox. ER at [facility] or [place] recommended. Contact information for [facility] given  Follow-up: Return if symptoms worsen or fail to improve.  Claretta Fraise, M.D.

## 2017-07-24 ENCOUNTER — Telehealth: Payer: Self-pay | Admitting: Family Medicine

## 2017-07-24 ENCOUNTER — Encounter: Payer: Medicare Other | Admitting: Family Medicine

## 2017-07-24 NOTE — Telephone Encounter (Signed)
I sent in the requested prescription 

## 2017-07-24 NOTE — Telephone Encounter (Signed)
Aware.  Lomotil was called to [facility].

## 2017-07-24 NOTE — Telephone Encounter (Signed)
Please advise if medicine will be sent to pharmacy. 

## 2017-07-25 ENCOUNTER — Encounter: Payer: Self-pay | Admitting: General Practice

## 2017-07-26 NOTE — Telephone Encounter (Signed)
Attempted to contact patient - NVM 

## 2017-07-26 NOTE — Telephone Encounter (Signed)
What is the name of the medication? colchicine 0.6 MG tablet  Have you contacted your pharmacy to request a refill? No refills  Which pharmacy would you like this sent to? [facility] in [place]   Patient notified that their request is being sent to the clinical staff for review and that they should receive a call once it is complete. If they do not receive a call within 24 hours they can check with their pharmacy or our office.

## 2017-07-31 ENCOUNTER — Other Ambulatory Visit: Payer: Self-pay | Admitting: Family Medicine

## 2017-08-03 ENCOUNTER — Other Ambulatory Visit: Payer: Self-pay | Admitting: Family Medicine

## 2017-08-09 ENCOUNTER — Ambulatory Visit (INDEPENDENT_AMBULATORY_CARE_PROVIDER_SITE_OTHER): Payer: Medicare Other | Admitting: Family Medicine

## 2017-08-09 ENCOUNTER — Encounter: Payer: Self-pay | Admitting: Family Medicine

## 2017-08-09 VITALS — BP 130/75 | HR 85 | Temp 97.1°F | Ht 70.08 in | Wt 157.0 lb

## 2017-08-09 DIAGNOSIS — Z0000 Encounter for general adult medical examination without abnormal findings: Secondary | ICD-10-CM | POA: Diagnosis not present

## 2017-08-09 DIAGNOSIS — M79675 Pain in left toe(s): Secondary | ICD-10-CM

## 2017-08-09 DIAGNOSIS — Z125 Encounter for screening for malignant neoplasm of prostate: Secondary | ICD-10-CM | POA: Diagnosis not present

## 2017-08-09 DIAGNOSIS — G4700 Insomnia, unspecified: Secondary | ICD-10-CM

## 2017-08-09 DIAGNOSIS — Z136 Encounter for screening for cardiovascular disorders: Secondary | ICD-10-CM | POA: Diagnosis not present

## 2017-08-09 LAB — URINALYSIS
Bilirubin, UA: NEGATIVE
Glucose, UA: NEGATIVE
Ketones, UA: NEGATIVE
Leukocytes, UA: NEGATIVE
Nitrite, UA: NEGATIVE
Protein, UA: NEGATIVE
RBC, UA: NEGATIVE
Specific Gravity, UA: 1.015 (ref 1.005–1.030)
Urobilinogen, Ur: 0.2 mg/dL (ref 0.2–1.0)
pH, UA: 7 (ref 5.0–7.5)

## 2017-08-09 MED ORDER — TRAZODONE HCL 300 MG PO TABS: 600.0000 mg | ORAL_TABLET | Freq: Every day | ORAL | 5 refills | Status: DC

## 2017-08-09 NOTE — Progress Notes (Signed)
Subjective:  Patient ID: Bruce Goodman, male    DOB: [birth year]  Age: 61 y.o. MRN: [id number]  CC: Annual Exam (pt here today for CPE and also c/o insomnia, trazodone and remeron isn't working. He is also c/o left great toe swelling and painful.)   HPI Bruce Goodman presents for patient states that he is doing well except he is just not getting any sleep.  He is able to function but is miserable at times.  He has been taking tramadol at 600 mg at night without more than a couple of hours.  He states that the mirtazapine was useless.  He is tried Ambien in the past without relief.  He has also tried Valium and Xanax and is unwilling to try those again because they are habit-forming.  He says melatonin is useless as well.  He does not want to try anything that is habit forming.  Depression screen [facility] 2/9 08/09/2017 07/19/2017 08/10/2015  Decreased Interest 0 0 0  Down, Depressed, Hopeless 0 0 0  PHQ - 2 Score 0 0 0    History Bruce Goodman has a past medical history of Bipolar 1 disorder ([place]), Depression, Heroin abuse ([place]), Lung nodule, Marijuana abuse, and Schizophrenia ([place]).   He has a past surgical history that includes Wrist surgery and No past surgeries.   His family history includes COPD in his sister.He reports that he has quit smoking. His smoking use included cigarettes. He has a 5.00 pack-year smoking history. he has never used smokeless tobacco. He reports that he uses drugs. Drug: Marijuana. He reports that he does not drink alcohol.    ROS Review of Systems  Constitutional: Negative for activity change, appetite change, chills, diaphoresis, fatigue, fever and unexpected weight change.  HENT: Negative for congestion, ear pain, hearing loss, postnasal drip, rhinorrhea, sore throat, tinnitus and trouble swallowing.   Eyes: Negative for photophobia, pain, discharge and redness.  Respiratory: Negative for apnea, cough, choking, chest tightness, shortness of breath, wheezing  and stridor.   Cardiovascular: Negative for chest pain, palpitations and leg swelling.  Gastrointestinal: Negative for abdominal distention, abdominal pain, blood in stool, constipation, diarrhea, nausea and vomiting.  Endocrine: Negative for cold intolerance, heat intolerance, polydipsia, polyphagia and polyuria.  Genitourinary: Negative for difficulty urinating, dysuria, enuresis, flank pain, frequency, genital sores, hematuria and urgency.  Musculoskeletal: Negative for arthralgias and joint swelling.  Skin: Negative for color change, rash and wound.  Allergic/Immunologic: Negative for immunocompromised state.  Neurological: Negative for dizziness, tremors, seizures, syncope, facial asymmetry, speech difficulty, weakness, light-headedness, numbness and headaches.  Hematological: Does not bruise/bleed easily.  Psychiatric/Behavioral: Positive for sleep disturbance. Negative for agitation, behavioral problems, confusion, decreased concentration, dysphoric mood, hallucinations and suicidal ideas. The patient is nervous/anxious. The patient is not hyperactive.     Objective:  BP 130/75   Pulse 85   Temp (!) 97.1 F (36.2 C) (Oral)   Ht 5' 10.08" (1.78 m)   Wt 157 lb (71.2 kg)   BMI 22.48 kg/m   BP Readings from Last 3 Encounters:  08/09/17 130/75  07/19/17 117/64  05/21/16 117/80    Wt Readings from Last 3 Encounters:  08/09/17 157 lb (71.2 kg)  07/19/17 153 lb (69.4 kg)  05/20/16 170 lb (77.1 kg)     Physical Exam  Constitutional: He is oriented to person, place, and time. He appears well-developed and well-nourished.  HENT:  Head: Normocephalic and atraumatic.  Mouth/Throat: Oropharynx is clear and moist.  Eyes: EOM are normal. Pupils  are equal, round, and reactive to light.  Neck: Normal range of motion. No tracheal deviation present. No thyromegaly present.  Cardiovascular: Normal rate, regular rhythm and normal heart sounds. Exam reveals no gallop and no friction rub.    No murmur heard. Pulmonary/Chest: Breath sounds normal. He has no wheezes. He has no rales.  Abdominal: Soft. He exhibits no mass. There is no tenderness.  Genitourinary: Testes normal. Right testis shows no mass. Left testis shows no mass. Circumcised.  Musculoskeletal: Normal range of motion. He exhibits no edema.       Left foot: There is tenderness (Located at the first MTP joint.  Moderately tender for percussion).  Neurological: He is alert and oriented to person, place, and time.  Skin: Skin is warm and dry.  Psychiatric: He has a normal mood and affect.      Assessment & Plan:   Bruce Goodman was seen today for annual exam.  Diagnoses and all orders for this visit:  Wellness examination -     CBC with Differential/Platelet -     CMP14+EGFR -     Lipid panel -     PSA, total and free -     Urinalysis  Great toe pain, left -     Ambulatory referral to Podiatry  Insomnia, unspecified type  Other orders -     trazodone (DESYREL) 300 MG tablet; Take 2 tablets (600 mg total) at bedtime by mouth. For sleep       I have discontinued Bruce Goodman's mirtazapine and diphenoxylate-atropine. I have also changed his trazodone. Additionally, I am having him maintain his COLCRYS.  Allergies as of 08/09/2017      Reactions   Haldol [haloperidol Decanoate] Other (See Comments)   Alters mental status   Seroquel [quetiapine Fumerate] Other (See Comments)   UNKNOWN REACTION      Medication List        Accurate as of 08/09/17  2:05 PM. Always use your most recent med list.          COLCRYS 0.6 MG tablet Generic drug:  colchicine TAKE 1 TABLET BY MOUTH EVERY 2 HOURS X4 DOSES AND THEN 1 TABLET EACH MORNING.   trazodone 300 MG tablet Commonly known as:  DESYREL Take 2 tablets (600 mg total) at bedtime by mouth. For sleep        Follow-up: Return in about 6 months (around 02/06/2018).  Claretta Fraise, M.D.

## 2017-08-10 LAB — CBC WITH DIFFERENTIAL/PLATELET
Basophils Absolute: 0 10*3/uL (ref 0.0–0.2)
Basos: 1 %
EOS (ABSOLUTE): 0.1 10*3/uL (ref 0.0–0.4)
Eos: 1 %
Hematocrit: 42.6 % (ref 37.5–51.0)
Hemoglobin: 14.4 g/dL (ref 13.0–17.7)
Immature Grans (Abs): 0 10*3/uL (ref 0.0–0.1)
Immature Granulocytes: 0 %
Lymphocytes Absolute: 2.1 10*3/uL (ref 0.7–3.1)
Lymphs: 35 %
MCH: 32.1 pg (ref 26.6–33.0)
MCHC: 33.8 g/dL (ref 31.5–35.7)
MCV: 95 fL (ref 79–97)
Monocytes Absolute: 0.5 10*3/uL (ref 0.1–0.9)
Monocytes: 8 %
Neutrophils Absolute: 3.3 10*3/uL (ref 1.4–7.0)
Neutrophils: 55 %
Platelets: 234 10*3/uL (ref 150–379)
RBC: 4.48 x10E6/uL (ref 4.14–5.80)
RDW: 14.1 % (ref 12.3–15.4)
WBC: 6 10*3/uL (ref 3.4–10.8)

## 2017-08-10 LAB — CMP14+EGFR
ALT: 15 IU/L (ref 0–44)
AST: 16 IU/L (ref 0–40)
Albumin/Globulin Ratio: 1.9 (ref 1.2–2.2)
Albumin: 4.6 g/dL (ref 3.6–4.8)
Alkaline Phosphatase: 62 IU/L (ref 39–117)
BUN/Creatinine Ratio: 8 — ABNORMAL LOW (ref 10–24)
BUN: 7 mg/dL — ABNORMAL LOW (ref 8–27)
Bilirubin Total: 1.3 mg/dL — ABNORMAL HIGH (ref 0.0–1.2)
CO2: 30 mmol/L — ABNORMAL HIGH (ref 20–29)
Calcium: 9.2 mg/dL (ref 8.6–10.2)
Chloride: 99 mmol/L (ref 96–106)
Creatinine, Ser: 0.85 mg/dL (ref 0.76–1.27)
GFR calc Af Amer: 109 mL/min/{1.73_m2} (ref >59)
GFR calc non Af Amer: 94 mL/min/{1.73_m2} (ref >59)
Globulin, Total: 2.4 g/dL (ref 1.5–4.5)
Glucose: 86 mg/dL (ref 65–99)
Potassium: 3.9 mmol/L (ref 3.5–5.2)
Sodium: 139 mmol/L (ref 134–144)
Total Protein: 7 g/dL (ref 6.0–8.5)

## 2017-08-10 LAB — LIPID PANEL
Chol/HDL Ratio: 3.8 ratio (ref 0.0–5.0)
Cholesterol, Total: 183 mg/dL (ref 100–199)
HDL: 48 mg/dL (ref >39)
LDL Calculated: 115 mg/dL — ABNORMAL HIGH (ref 0–99)
Triglycerides: 99 mg/dL (ref 0–149)
VLDL Cholesterol Cal: 20 mg/dL (ref 5–40)

## 2017-08-10 LAB — PSA, TOTAL AND FREE
PSA, Free Pct: 31.7 %
PSA, Free: 0.19 ng/mL
Prostate Specific Ag, Serum: 0.6 ng/mL (ref 0.0–4.0)

## 2017-08-10 NOTE — Telephone Encounter (Signed)
Patient seen since phone call.  This encounter will now be closed 

## 2017-08-31 ENCOUNTER — Ambulatory Visit: Payer: Medicare Other | Admitting: Podiatry

## 2017-09-22 ENCOUNTER — Telehealth: Payer: Self-pay | Admitting: Family Medicine

## 2017-09-22 NOTE — Telephone Encounter (Signed)
Incoming call from pt's daughter Daughter states pt needs order for pt to have meds D[facility] informed [facility]

## 2018-02-06 ENCOUNTER — Ambulatory Visit: Payer: Medicare Other | Admitting: Family Medicine

## 2018-02-09 ENCOUNTER — Encounter: Payer: Self-pay | Admitting: Family Medicine

## 2018-02-23 DIAGNOSIS — F4320 Adjustment disorder, unspecified: Secondary | ICD-10-CM | POA: Diagnosis not present

## 2020-07-24 ENCOUNTER — Other Ambulatory Visit: Payer: Self-pay

## 2020-07-24 ENCOUNTER — Emergency Department (HOSPITAL_COMMUNITY)

## 2020-07-24 ENCOUNTER — Encounter (HOSPITAL_COMMUNITY): Payer: Self-pay | Admitting: Emergency Medicine

## 2020-07-24 ENCOUNTER — Inpatient Hospital Stay (HOSPITAL_COMMUNITY)
Admission: EM | Admit: 2020-07-24 | Discharge: 2020-07-27 | DRG: 177 | Disposition: A | Discharge status: Home / Self Care | Attending: Internal Medicine | Admitting: Internal Medicine

## 2020-07-24 ENCOUNTER — Inpatient Hospital Stay (HOSPITAL_COMMUNITY)

## 2020-07-24 DIAGNOSIS — G9341 Metabolic encephalopathy: Secondary | ICD-10-CM | POA: Diagnosis present

## 2020-07-24 DIAGNOSIS — F1110 Opioid abuse, uncomplicated: Secondary | ICD-10-CM | POA: Diagnosis present

## 2020-07-24 DIAGNOSIS — U071 COVID-19: Secondary | ICD-10-CM | POA: Diagnosis present

## 2020-07-24 DIAGNOSIS — Z87891 Personal history of nicotine dependence: Secondary | ICD-10-CM

## 2020-07-24 DIAGNOSIS — F1910 Other psychoactive substance abuse, uncomplicated: Secondary | ICD-10-CM | POA: Diagnosis present

## 2020-07-24 DIAGNOSIS — F1210 Cannabis abuse, uncomplicated: Secondary | ICD-10-CM | POA: Diagnosis present

## 2020-07-24 DIAGNOSIS — F319 Bipolar disorder, unspecified: Secondary | ICD-10-CM | POA: Diagnosis present

## 2020-07-24 DIAGNOSIS — R627 Adult failure to thrive: Secondary | ICD-10-CM

## 2020-07-24 DIAGNOSIS — R911 Solitary pulmonary nodule: Secondary | ICD-10-CM | POA: Diagnosis present

## 2020-07-24 DIAGNOSIS — K859 Acute pancreatitis without necrosis or infection, unspecified: Secondary | ICD-10-CM

## 2020-07-24 DIAGNOSIS — E871 Hypo-osmolality and hyponatremia: Secondary | ICD-10-CM

## 2020-07-24 DIAGNOSIS — Z888 Allergy status to other drugs, medicaments and biological substances status: Secondary | ICD-10-CM

## 2020-07-24 DIAGNOSIS — J9811 Atelectasis: Secondary | ICD-10-CM | POA: Diagnosis present

## 2020-07-24 DIAGNOSIS — E860 Dehydration: Secondary | ICD-10-CM | POA: Diagnosis present

## 2020-07-24 DIAGNOSIS — Z79899 Other long term (current) drug therapy: Secondary | ICD-10-CM

## 2020-07-24 DIAGNOSIS — E876 Hypokalemia: Secondary | ICD-10-CM | POA: Diagnosis present

## 2020-07-24 DIAGNOSIS — D72819 Decreased white blood cell count, unspecified: Secondary | ICD-10-CM | POA: Diagnosis present

## 2020-07-24 DIAGNOSIS — D696 Thrombocytopenia, unspecified: Secondary | ICD-10-CM | POA: Diagnosis present

## 2020-07-24 DIAGNOSIS — R748 Abnormal levels of other serum enzymes: Secondary | ICD-10-CM | POA: Diagnosis present

## 2020-07-24 DIAGNOSIS — K5900 Constipation, unspecified: Secondary | ICD-10-CM | POA: Diagnosis present

## 2020-07-24 DIAGNOSIS — K2970 Gastritis, unspecified, without bleeding: Secondary | ICD-10-CM | POA: Diagnosis present

## 2020-07-24 DIAGNOSIS — F209 Schizophrenia, unspecified: Secondary | ICD-10-CM | POA: Diagnosis present

## 2020-07-24 DIAGNOSIS — Z681 Body mass index (BMI) 19 or less, adult: Secondary | ICD-10-CM

## 2020-07-24 DIAGNOSIS — R1013 Epigastric pain: Secondary | ICD-10-CM | POA: Diagnosis present

## 2020-07-24 LAB — COMPREHENSIVE METABOLIC PANEL
ALT: 13 U/L (ref 0–44)
AST: 21 U/L (ref 15–41)
Albumin: 3.3 g/dL — ABNORMAL LOW (ref 3.5–5.0)
Alkaline Phosphatase: 41 U/L (ref 38–126)
Anion gap: 12 (ref 5–15)
BUN: 13 mg/dL (ref 8–23)
CO2: 26 mmol/L (ref 22–32)
Calcium: 7.4 mg/dL — ABNORMAL LOW (ref 8.9–10.3)
Chloride: 91 mmol/L — ABNORMAL LOW (ref 98–111)
Creatinine, Ser: 0.69 mg/dL (ref 0.61–1.24)
GFR, Estimated: >60 mL/min (ref >60)
Glucose, Bld: 81 mg/dL (ref 70–99)
Potassium: 3.2 mmol/L — ABNORMAL LOW (ref 3.5–5.1)
Sodium: 129 mmol/L — ABNORMAL LOW (ref 135–145)
Total Bilirubin: 1.7 mg/dL — ABNORMAL HIGH (ref 0.3–1.2)
Total Protein: 6.3 g/dL — ABNORMAL LOW (ref 6.5–8.1)

## 2020-07-24 LAB — URINALYSIS, ROUTINE W REFLEX MICROSCOPIC
Bilirubin Urine: NEGATIVE
Glucose, UA: NEGATIVE mg/dL
Hgb urine dipstick: NEGATIVE
Ketones, ur: 40 mg/dL — AB
Leukocytes,Ua: NEGATIVE
Nitrite: NEGATIVE
Protein, ur: NEGATIVE mg/dL
Specific Gravity, Urine: 1.01 (ref 1.005–1.030)
pH: 6 (ref 5.0–8.0)

## 2020-07-24 LAB — CBC WITH DIFFERENTIAL/PLATELET
Abs Immature Granulocytes: 0.03 10*3/uL (ref 0.00–0.07)
Basophils Absolute: 0 10*3/uL (ref 0.0–0.1)
Basophils Relative: 1 %
Eosinophils Absolute: 0 10*3/uL (ref 0.0–0.5)
Eosinophils Relative: 0 %
HCT: 45.6 % (ref 39.0–52.0)
Hemoglobin: 16 g/dL (ref 13.0–17.0)
Immature Granulocytes: 1 %
Lymphocytes Relative: 28 %
Lymphs Abs: 0.7 10*3/uL (ref 0.7–4.0)
MCH: 31.8 pg (ref 26.0–34.0)
MCHC: 35.1 g/dL (ref 30.0–36.0)
MCV: 90.7 fL (ref 80.0–100.0)
Monocytes Absolute: 0.3 10*3/uL (ref 0.1–1.0)
Monocytes Relative: 12 %
Neutro Abs: 1.4 10*3/uL — ABNORMAL LOW (ref 1.7–7.7)
Neutrophils Relative %: 58 %
Platelets: 84 10*3/uL — ABNORMAL LOW (ref 150–400)
RBC: 5.03 MIL/uL (ref 4.22–5.81)
RDW: 12.7 % (ref 11.5–15.5)
WBC: 2.5 10*3/uL — ABNORMAL LOW (ref 4.0–10.5)
nRBC: 0 % (ref 0.0–0.2)

## 2020-07-24 LAB — LIPID PANEL
Cholesterol: 109 mg/dL (ref 0–200)
HDL: 23 mg/dL — ABNORMAL LOW (ref >40)
LDL Cholesterol: 71 mg/dL (ref 0–99)
Total CHOL/HDL Ratio: 4.7 RATIO
Triglycerides: 74 mg/dL (ref <150)
VLDL: 15 mg/dL (ref 0–40)

## 2020-07-24 LAB — HIV ANTIBODY (ROUTINE TESTING W REFLEX): HIV Screen 4th Generation wRfx: NONREACTIVE

## 2020-07-24 LAB — LIPASE, BLOOD: Lipase: 148 U/L — ABNORMAL HIGH (ref 11–51)

## 2020-07-24 LAB — APTT: aPTT: 30 seconds (ref 24–36)

## 2020-07-24 LAB — TROPONIN I (HIGH SENSITIVITY)
Troponin I (High Sensitivity): 3 ng/L (ref <18)
Troponin I (High Sensitivity): 4 ng/L (ref <18)

## 2020-07-24 LAB — RESPIRATORY PANEL BY RT PCR (FLU A&B, COVID)
Influenza A by PCR: NEGATIVE
Influenza B by PCR: NEGATIVE
SARS Coronavirus 2 by RT PCR: POSITIVE — AB

## 2020-07-24 LAB — ETHANOL: Alcohol, Ethyl (B): <10 mg/dL (ref <10)

## 2020-07-24 LAB — LACTIC ACID, PLASMA: Lactic Acid, Venous: 1.2 mmol/L (ref 0.5–1.9)

## 2020-07-24 MED ORDER — SODIUM CHLORIDE 0.9 % IV BOLUS
1000.0000 mL | Freq: Once | INTRAVENOUS | Status: AC
  Administered 2020-07-24: 1000 mL via INTRAVENOUS

## 2020-07-24 MED ORDER — SODIUM CHLORIDE 0.9 % IV SOLN: Freq: Once | INTRAVENOUS | Status: AC

## 2020-07-24 MED ORDER — SODIUM CHLORIDE 0.9 % IV SOLN: 100.0000 mg | Freq: Every day | INTRAVENOUS | Status: DC

## 2020-07-24 MED ORDER — IPRATROPIUM BROMIDE HFA 17 MCG/ACT IN AERS
2.0000 | INHALATION_SPRAY | Freq: Four times a day (QID) | RESPIRATORY_TRACT | Status: DC | PRN
  Filled 2020-07-24: qty 12.9

## 2020-07-24 MED ORDER — SODIUM CHLORIDE 0.9 % IV SOLN
100.0000 mg | Freq: Every day | INTRAVENOUS | Status: AC
  Administered 2020-07-25 – 2020-07-27 (×4): 100 mg via INTRAVENOUS
  Filled 2020-07-24 (×3): qty 20

## 2020-07-24 MED ORDER — POTASSIUM CHLORIDE 10 MEQ/100ML IV SOLN
10.0000 meq | INTRAVENOUS | Status: AC
  Administered 2020-07-24 (×4): 10 meq via INTRAVENOUS
  Filled 2020-07-24 (×4): qty 100

## 2020-07-24 MED ORDER — IOHEXOL 300 MG/ML  SOLN
80.0000 mL | Freq: Once | INTRAMUSCULAR | Status: AC | PRN
  Administered 2020-07-25: 80 mL via INTRAVENOUS

## 2020-07-24 MED ORDER — ONDANSETRON HCL 4 MG/2ML IJ SOLN: 4.0000 mg | Freq: Four times a day (QID) | INTRAMUSCULAR | Status: DC | PRN

## 2020-07-24 MED ORDER — SODIUM CHLORIDE 0.9 % IV SOLN
100.0000 mg | INTRAVENOUS | Status: AC
  Administered 2020-07-24 (×2): 100 mg via INTRAVENOUS
  Filled 2020-07-24 (×2): qty 20

## 2020-07-24 MED ORDER — SODIUM CHLORIDE 0.9 % IV SOLN: INTRAVENOUS | Status: DC

## 2020-07-24 MED ORDER — ONDANSETRON HCL 4 MG/2ML IJ SOLN
4.0000 mg | Freq: Once | INTRAMUSCULAR | Status: AC
  Administered 2020-07-24: 4 mg via INTRAVENOUS
  Filled 2020-07-24: qty 2

## 2020-07-24 MED ORDER — ONDANSETRON HCL 4 MG PO TABS: 4.0000 mg | ORAL_TABLET | Freq: Four times a day (QID) | ORAL | Status: DC | PRN

## 2020-07-24 MED ORDER — SODIUM CHLORIDE 0.9 % IV SOLN: 200.0000 mg | Freq: Once | INTRAVENOUS | Status: DC

## 2020-07-24 MED ORDER — HYDROCOD POLST-CPM POLST ER 10-8 MG/5ML PO SUER: 5.0000 mL | Freq: Two times a day (BID) | ORAL | Status: DC | PRN

## 2020-07-24 MED ORDER — GUAIFENESIN-DM 100-10 MG/5ML PO SYRP: 10.0000 mL | ORAL_SOLUTION | ORAL | Status: DC | PRN

## 2020-07-24 MED ORDER — LORAZEPAM 2 MG/ML IJ SOLN
0.5000 mg | INTRAMUSCULAR | Status: DC | PRN
  Administered 2020-07-26: 0.5 mg via INTRAVENOUS
  Filled 2020-07-24: qty 1

## 2020-07-24 MED ORDER — MORPHINE SULFATE (PF) 4 MG/ML IV SOLN
4.0000 mg | Freq: Once | INTRAVENOUS | Status: AC
  Administered 2020-07-24: 4 mg via INTRAVENOUS
  Filled 2020-07-24: qty 1

## 2020-07-24 MED ORDER — IOHEXOL 9 MG/ML PO SOLN
ORAL | Status: AC
  Filled 2020-07-24: qty 1000

## 2020-07-24 NOTE — ED Notes (Addendum)
While obtaining vital signs pt states "im having trouble hearing, breathing, and there is something inside me that needs to get out".

## 2020-07-24 NOTE — ED Notes (Signed)
Pt states "im sick, I havent eaten in three weeks."

## 2020-07-24 NOTE — ED Provider Notes (Signed)
[facility] EMERGENCY DEPARTMENT Provider Note   CSN: [id number] Arrival date & time: 07/24/20  4888     History Chief Complaint  Patient presents with  . Failure To [place] is a 64 y.o. male.  He has a history of psychiatric disease.  He is [facility] in from jail with reportedly less responsive.  Very little information is available from EMS or staff.  Possibly not eating for a few days.  Patient himself endorses abdominal pain.  Cannot tell me how long its been going on.  Has refused his meds sounds like since the spring.  The history is provided by the patient and the EMS personnel. The history is limited by the condition of the patient.  Abdominal Pain Pain location:  Generalized Pain severity:  Unable to specify Onset quality:  Unable to specify Timing:  Unable to specify Context: not trauma   Relieved by:  None tried Worsened by:  Nothing Ineffective treatments:  None tried      Past Medical History:  Diagnosis Date  . Bipolar 1 disorder ([place])   . Depression   . Heroin abuse ([place])   . Lung nodule    left  . Marijuana abuse   . Schizophrenia [facility])     Patient Active Problem List   Diagnosis Date Noted  . Schizoaffective disorder, bipolar type ([place]) 05/21/2016  . Foot cramps 08/10/2015  . Lung nodule seen on imaging study 08/10/2015  . Cannabis abuse 06/29/2012    Class: Chronic  . Generalized anxiety disorder 06/29/2012    Class: Chronic    Past Surgical History:  Procedure Laterality Date  . NO PAST SURGERIES     rt wrist surg and carpal tunnel syndrome   . WRIST SURGERY     right       Family History  Problem Relation Age of Onset  . COPD Sister     Social History   Tobacco Use  . Smoking status: Former Smoker    Packs/day: 0.50    Years: 10.00    Pack years: 5.00    Types: Cigarettes  . Smokeless tobacco: Never Used  Substance Use Topics  . Alcohol use: No  . Drug use: Yes    Types: Marijuana    Comment:  heroin on occasion    Home Medications Prior to Admission medications   Medication Sig Start Date End Date Taking? Authorizing Provider  COLCRYS 0.6 MG tablet TAKE 1 TABLET BY MOUTH EVERY 2 HOURS X4 DOSES AND THEN 1 TABLET EACH MORNING. Patient not taking: Reported on 08/09/2017 08/04/17   Claretta Fraise, MD  trazodone (DESYREL) 300 MG tablet Take 2 tablets (600 mg total) at bedtime by mouth. For sleep 08/09/17   Claretta Fraise, MD    Allergies    Haldol [haloperidol decanoate] and Seroquel [quetiapine fumerate]  Review of Systems   Review of Systems  Unable to perform ROS: Mental status change  Gastrointestinal: Positive for abdominal pain.    Physical Exam Updated Vital Signs BP 109/69   Pulse 82   Temp 99.6 F (37.6 C) (Oral)   Resp (!) 24   Ht 5\' 11"  (1.803 m)   Wt 54.4 kg   SpO2 100%   BMI 16.74 kg/m   Physical Exam Vitals and nursing note reviewed.  Constitutional:      Appearance: Normal appearance. He is well-developed.  HENT:     Head: Normocephalic and atraumatic.  Eyes:     Conjunctiva/sclera: Conjunctivae normal.  Cardiovascular:     Rate and Rhythm: Normal rate and regular rhythm.     Heart sounds: No murmur heard.   Pulmonary:     Effort: Pulmonary effort is normal. No respiratory distress.     Breath sounds: Normal breath sounds.  Abdominal:     Palpations: Abdomen is soft. There is no mass.     Tenderness: There is abdominal tenderness (generalized). There is no guarding or rebound.  Musculoskeletal:        General: No deformity or signs of injury. Normal range of motion.     Cervical back: Neck supple.  Skin:    General: Skin is warm and dry.  Neurological:     General: No focal deficit present.     Mental Status: He is alert.     ED Results / Procedures / Treatments   Labs (all labs ordered are listed, but only abnormal results are displayed) Labs Reviewed  RESPIRATORY PANEL BY RT PCR (FLU A&B, COVID) - Abnormal; Notable for the  following components:      Result Value   SARS Coronavirus 2 by RT PCR POSITIVE (*)    All other components within normal limits  COMPREHENSIVE METABOLIC PANEL - Abnormal; Notable for the following components:   Sodium 129 (*)    Potassium 3.2 (*)    Chloride 91 (*)    Calcium 7.4 (*)    Total Protein 6.3 (*)    Albumin 3.3 (*)    Total Bilirubin 1.7 (*)    All other components within normal limits  LIPASE, BLOOD - Abnormal; Notable for the following components:   Lipase 148 (*)    All other components within normal limits  CBC WITH DIFFERENTIAL/PLATELET - Abnormal; Notable for the following components:   WBC 2.5 (*)    Platelets 84 (*)    Neutro Abs 1.4 (*)    All other components within normal limits  URINALYSIS, ROUTINE W REFLEX MICROSCOPIC - Abnormal; Notable for the following components:   Ketones, ur 40 (*)    All other components within normal limits  LIPID PANEL - Abnormal; Notable for the following components:   HDL 23 (*)    All other components within normal limits  ETHANOL  LACTIC ACID, PLASMA  APTT  HIV ANTIBODY (ROUTINE TESTING W REFLEX)  CBC WITH DIFFERENTIAL/PLATELET  COMPREHENSIVE METABOLIC PANEL  C-REACTIVE PROTEIN  D-DIMER, QUANTITATIVE (NOT AT [facility])  FERRITIN  MAGNESIUM  LIPASE, BLOOD  TROPONIN I (HIGH SENSITIVITY)  TROPONIN I (HIGH SENSITIVITY)    EKG EKG Interpretation  Date/Time:  Friday July 24 2020 08:12:37 EDT Ventricular Rate:  82 PR Interval:    QRS Duration: 97 QT Interval:  391 QTC Calculation: 457 R Axis:   -65 Text Interpretation: Sinus rhythm Left anterior fascicular block Abnormal R-wave progression, early transition ST elevation, consider inferior injury No significant change since prior 2/15 Confirmed by Aletta Edouard [phone]) on 07/24/2020 8:16:54 AM   Radiology US Abdomen Complete  Result Date: 07/24/2020 CLINICAL DATA:  Abdominal pain EXAM: ABDOMEN ULTRASOUND COMPLETE COMPARISON:  CT 02/12/2016 FINDINGS:  Gallbladder: No gallstones or wall thickening visualized. No sonographic Murphy sign noted by sonographer. Common bile duct: Diameter: 4.9 mm Liver: No focal lesion identified. Within normal limits in parenchymal echogenicity. Portal vein is patent on color Doppler imaging with normal direction of blood flow towards the liver. IVC: No abnormality visualized. Pancreas: Visualized portion unremarkable. Spleen: Splenic size normal 8.8 mm in length. Hyperechoic region in the central spleen measuring 13 x  35 mm. This is not seen on the prior CT. Right Kidney: Length: 10.4 cm. Echogenicity within normal limits. No mass or hydronephrosis visualized. Left Kidney: Length: 9.5 cm. Echogenicity within normal limits. No mass or hydronephrosis visualized. Abdominal aorta: No aneurysm visualized. Other findings: None. IMPRESSION: Negative for gallstones.  Normal liver Echogenic focus in the central liver may represent fat in the splenic hilum or hemangioma. No lesion visualized on prior CT 2017. Electronically Signed   By: Franchot Gallo M.D.   On: 07/24/2020 11:40   DG Chest Port 1 View  Result Date: 07/24/2020 CLINICAL DATA:  Shortness of breath EXAM: PORTABLE CHEST 1 VIEW COMPARISON:  12/16/2013 rib/chest radiographs and prior. FINDINGS: Telemetry wires overlie the chest. No focal consolidation, pneumothorax or pleural effusion. Cardiomediastinal silhouette within normal limits. Right acromioclavicular osteoarthrosis. IMPRESSION: No focal airspace disease. Electronically Signed   By: Primitivo Gauze M.D.   On: 07/24/2020 08:28    Procedures Procedures (including critical care time)  Medications Ordered in ED Medications  iohexol (OMNIPAQUE) 9 MG/ML oral solution (has no administration in time range)  potassium chloride 10 mEq in 100 mL IVPB (10 mEq Intravenous New Bag/Given 07/24/20 1524)  0.9 %  sodium chloride infusion ( Intravenous New Bag/Given 07/24/20 1539)  ipratropium (ATROVENT HFA) inhaler 2 puff  (has no administration in time range)  guaiFENesin-dextromethorphan (ROBITUSSIN DM) 100-10 MG/5ML syrup 10 mL (has no administration in time range)  chlorpheniramine-HYDROcodone (TUSSIONEX) 10-8 MG/5ML suspension 5 mL (has no administration in time range)  ondansetron (ZOFRAN) tablet 4 mg (has no administration in time range)    Or  ondansetron (ZOFRAN) injection 4 mg (has no administration in time range)  LORazepam (ATIVAN) injection 0.5 mg (has no administration in time range)  remdesivir 100 mg in sodium chloride 0.9 % 100 mL IVPB (100 mg Intravenous New Bag/Given 07/24/20 1525)    Followed by  remdesivir 100 mg in sodium chloride 0.9 % 100 mL IVPB (has no administration in time range)  iohexol (OMNIPAQUE) 300 MG/ML solution 80 mL (has no administration in time range)  sodium chloride 0.9 % bolus 1,000 mL (0 mLs Intravenous Stopped 07/24/20 1004)  morphine 4 MG/ML injection 4 mg (4 mg Intravenous Given 07/24/20 0855)  ondansetron (ZOFRAN) injection 4 mg (4 mg Intravenous Given 07/24/20 0854)  0.9 %  sodium chloride infusion ( Intravenous New Bag/Given 07/24/20 1313)  sodium chloride 0.9 % bolus 1,000 mL (1,000 mLs Intravenous New Bag/Given 07/24/20 1539)    ED Course  I have reviewed the triage vital signs and the nursing notes.  Pertinent labs & imaging results that were available during my care of the patient were reviewed by me and considered in my medical decision making (see chart for details).  Clinical Course as of Jul 24 1750  Fri Jul 24, 2020  0817 Patient told the nurse that he has not eaten in 3 weeks and that he has something in his abdomen that needs to be cut out.  He also says his hearing is decreased.   [MB]  0831 Chest x-ray interpreted by me as no acute pulmonary disease.   [MB]  1030 Labs showing a low white count and low platelets which are new for patient.  Chemistries showing low sodium low calcium low chloride consistent with some dehydration.  Bilirubin  elevated 1.7.  Left he is otherwise normal.  Lipase elevated at 144 consistent with pancreatitis.  Urine showing some ketones.  Getting IV fluids nausea pain medicine.   [MB]  6789  Ordered CT early on when patient presented.  Finding out now that the CT scanner is down and they are working on a repair.  Have put him in for ultrasound.   [MB]  1100 Patient's Covid testing is positive.   [MB]  1209 Discussed with Dr. Manuella Ghazi [facility] hospitalist who will evaluate the patient for admission.   [MB]    Clinical Course User Index [MB] Hayden Rasmussen, MD   MDM Rules/Calculators/A&P                         Bruce Goodman was evaluated in Emergency Department on 07/24/2020 for the symptoms described in the history of present illness. He was evaluated in the context of the global COVID-19 pandemic, which necessitated consideration that the patient might be at risk for infection with the SARS-CoV-2 virus that causes COVID-19. Institutional protocols and algorithms that pertain to the evaluation of patients at risk for COVID-19 are in a state of rapid change based on information released by regulatory bodies including the CDC and federal and state organizations. These policies and algorithms were followed during the patient's care in the ED.  This patient complains of abdominal pain, poor p.o. intake, shortness of breath this involves an extensive number of treatment Options and is a complaint that carries with it a high risk of complications and Morbidity. The differential includes hypoxia, Covid pneumonia, PE, pneumonia, pneumothorax, metabolic derangement, diverticulitis, colitis, pancreatitis, perforation  I ordered, reviewed and interpreted labs, which included CBC with depressed white count and platelets question viral illness, chemistries with low sodium low potassium low chloride question dehydration, low calcium question malnutrition, LFTs fairly normal other than elevated bilirubin, Covid testing  positive, troponins flat, lactate not elevated I ordered medication IV fluids pain and nausea medication I ordered imaging studies which included chest x-ray and abdominal ultrasound and I independently    visualized and interpreted imaging which showed no acute infiltrates normal gallbladder Additional history obtained from EMS Previous records obtained and reviewed in epic, no recent admissions I consulted Dr. Manuella Ghazi [facility] hospitalist and discussed lab and imaging findings  Critical Interventions: None  After the interventions stated above, I reevaluated the patient and found patient to be clinically dry and with signs of pancreatitis.  He is also Covid positive and not eating and drinking.  Feel it is reasonable for him to be admitted to the hospital for continued IV hydration and management of his symptoms.   Final Clinical Impression(s) / ED Diagnoses Final diagnoses:  COVID-19 virus infection  Acute pancreatitis, unspecified complication status, unspecified pancreatitis type  Failure to thrive in adult    Rx / DC Orders ED Discharge Orders    None       Hayden Rasmussen, MD 07/24/20 1759

## 2020-07-24 NOTE — H&P (Addendum)
History and Physical    Bruce Goodman YCX:[id number] DOB: 04-Jun-1956 DOA: 07/24/2020  PCP: Pcp, No   [facility] from: [facility]  Chief Complaint: Abdominal pain with nausea  HPI: Bruce Goodman is a 64 y.o. male with medical history significant for psychiatric disorders with noted bipolar one disorder as well as schizophrenia as well as polysubstance abuse who presented from the correctional facility after he was found unresponsive. EMS had arrived and patient was noted to be arousable at that time, but apparently he has not been eating or drinking much of anything over the last 2 weeks. He was apparently pointing to his upper abdomen and complaining of pain, but could not describe much more than this. He is a very poor historian and is currently stating that he has something "crawling around in my stomach." He denies any vomiting and states that he is also been having a sore throat as well as a mild cough that is nonproductive. No further meaningful history can be obtained at this time given his current condition.   ED Course: Stable vital signs noted and patient is afebrile. Laboratory data with noted potassium of 3.2, thrombocytopenia with platelet count of 84,000. Sodium is 129 and he is noted to have leukopenia with WBC of 2500. No AST or ALT elevation noted, but lipase is 148. CT imaging has not been performed at this time as this is currently nonfunctional. Ultrasound of the abdomen performed with no significant findings of common bile duct dilation or gallstones noted. There appears to be a mass suspicious for hemangioma noted. Patient has been given a 1 L fluid bolus along with some morphine for pain management and Zofran for nausea. He continues to writhe in pain on the bed and appears to be confabulating.  Review of Systems: Cannot be obtained given patient condition.  Past Medical History:  Diagnosis Date  . Bipolar 1 disorder ([place])   . Depression   . Heroin abuse ([place])   .  Lung nodule    left  . Marijuana abuse   . Schizophrenia [facility])     Past Surgical History:  Procedure Laterality Date  . NO PAST SURGERIES     rt wrist surg and carpal tunnel syndrome   . WRIST SURGERY     right     reports that he has quit smoking. His smoking use included cigarettes. He has a 5.00 pack-year smoking history. He has never used smokeless tobacco. He reports current drug use. Drug: Marijuana. He reports that he does not drink alcohol.  Allergies  Allergen Reactions  . Haldol [Haloperidol Decanoate] Other (See Comments)    Alters mental status  . Seroquel [Quetiapine Fumerate] Other (See Comments)    UNKNOWN REACTION    Family History  Problem Relation Age of Onset  . COPD Sister     Prior to Admission medications   Medication Sig Start Date End Date Taking? Authorizing Provider  COLCRYS 0.6 MG tablet TAKE 1 TABLET BY MOUTH EVERY 2 HOURS X4 DOSES AND THEN 1 TABLET EACH MORNING. Patient not taking: Reported on 08/09/2017 08/04/17   Claretta Fraise, MD  trazodone (DESYREL) 300 MG tablet Take 2 tablets (600 mg total) at bedtime by mouth. For sleep Patient not taking: Reported on 07/24/2020 08/09/17   Claretta Fraise, MD    Physical Exam: Vitals:   07/24/20 0930 07/24/20 1000 07/24/20 1030 07/24/20 1130  BP: 106/67 106/67 102/75 111/75  Pulse: 60 77 78   Resp: 17 16 (!) 21 17  Temp:      TempSrc:      SpO2: 98% 100% 100%   Weight:      Height:        Constitutional: Mild distress and confusion Vitals:   07/24/20 0930 07/24/20 1000 07/24/20 1030 07/24/20 1130  BP: 106/67 106/67 102/75 111/75  Pulse: 60 77 78   Resp: 17 16 (!) 21 17  Temp:      TempSrc:      SpO2: 98% 100% 100%   Weight:      Height:       Eyes: lids and conjunctivae normal ENMT: Mucous membranes are dry. Neck: normal, supple Respiratory: clear to auscultation bilaterally. Currently on 2 L nasal cannula oxygen. Cardiovascular: Regular rate and rhythm, no murmurs. No extremity  edema. Abdomen: no tenderness, no distention. Musculoskeletal:  No joint deformity upper and lower extremities.   Skin: no rashes, lesions, ulcers. Tattoos noted throughout. Psychiatric: Appears to be confabulating and is quite confused. Overall difficult to assess.  Labs on Admission: I have personally reviewed following labs and imaging studies  CBC: Recent Labs  Lab 07/24/20 0805  WBC 2.5*  NEUTROABS 1.4*  HGB 16.0  HCT 45.6  MCV 90.7  PLT 84*   Basic Metabolic Panel: Recent Labs  Lab 07/24/20 0805  NA 129*  K 3.2*  CL 91*  CO2 26  GLUCOSE 81  BUN 13  CREATININE 0.69  CALCIUM 7.4*   GFR: Estimated Creatinine Clearance: 71.8 mL/min (by C-G formula based on SCr of 0.69 mg/dL). Liver Function Tests: Recent Labs  Lab 07/24/20 0805  AST 21  ALT 13  ALKPHOS 41  BILITOT 1.7*  PROT 6.3*  ALBUMIN 3.3*   Recent Labs  Lab 07/24/20 0805  LIPASE 148*   No results for input(s): AMMONIA in the last 168 hours. Coagulation Profile: No results for input(s): INR, PROTIME in the last 168 hours. Cardiac Enzymes: No results for input(s): CKTOTAL, CKMB, CKMBINDEX, TROPONINI in the last 168 hours. BNP (last 3 results) No results for input(s): PROBNP in the last 8760 hours. HbA1C: No results for input(s): HGBA1C in the last 72 hours. CBG: No results for input(s): GLUCAP in the last 168 hours. Lipid Profile: No results for input(s): CHOL, HDL, LDLCALC, TRIG, CHOLHDL, LDLDIRECT in the last 72 hours. Thyroid Function Tests: No results for input(s): TSH, T4TOTAL, FREET4, T3FREE, THYROIDAB in the last 72 hours. Anemia Panel: No results for input(s): VITAMINB12, FOLATE, FERRITIN, TIBC, IRON, RETICCTPCT in the last 72 hours. Urine analysis:    Component Value Date/Time   COLORURINE YELLOW 07/24/2020 0801   APPEARANCEUR CLEAR 07/24/2020 0801   APPEARANCEUR Clear 08/09/2017 1031   LABSPEC 1.010 07/24/2020 0801   PHURINE 6.0 07/24/2020 0801   GLUCOSEU NEGATIVE 07/24/2020  0801   HGBUR NEGATIVE 07/24/2020 0801   BILIRUBINUR NEGATIVE 07/24/2020 0801   BILIRUBINUR Negative 08/09/2017 1031   KETONESUR 40 (A) 07/24/2020 0801   PROTEINUR NEGATIVE 07/24/2020 0801   UROBILINOGEN 1.0 12/20/2012 1156   NITRITE NEGATIVE 07/24/2020 0801   LEUKOCYTESUR NEGATIVE 07/24/2020 0801    Radiological Exams on Admission: US Abdomen Complete  Result Date: 07/24/2020 CLINICAL DATA:  Abdominal pain EXAM: ABDOMEN ULTRASOUND COMPLETE COMPARISON:  CT 02/12/2016 FINDINGS: Gallbladder: No gallstones or wall thickening visualized. No sonographic Murphy sign noted by sonographer. Common bile duct: Diameter: 4.9 mm Liver: No focal lesion identified. Within normal limits in parenchymal echogenicity. Portal vein is patent on color Doppler imaging with normal direction of blood flow towards the liver. IVC: No  abnormality visualized. Pancreas: Visualized portion unremarkable. Spleen: Splenic size normal 8.8 mm in length. Hyperechoic region in the central spleen measuring 13 x 35 mm. This is not seen on the prior CT. Right Kidney: Length: 10.4 cm. Echogenicity within normal limits. No mass or hydronephrosis visualized. Left Kidney: Length: 9.5 cm. Echogenicity within normal limits. No mass or hydronephrosis visualized. Abdominal aorta: No aneurysm visualized. Other findings: None. IMPRESSION: Negative for gallstones.  Normal liver Echogenic focus in the central liver may represent fat in the splenic hilum or hemangioma. No lesion visualized on prior CT 2017. Electronically Signed   By: Franchot Gallo M.D.   On: 07/24/2020 11:40   DG Chest Port 1 View  Result Date: 07/24/2020 CLINICAL DATA:  Shortness of breath EXAM: PORTABLE CHEST 1 VIEW COMPARISON:  12/16/2013 rib/chest radiographs and prior. FINDINGS: Telemetry wires overlie the chest. No focal consolidation, pneumothorax or pleural effusion. Cardiomediastinal silhouette within normal limits. Right acromioclavicular osteoarthrosis. IMPRESSION: No  focal airspace disease. Electronically Signed   By: Primitivo Gauze M.D.   On: 07/24/2020 08:28    EKG: Independently reviewed. SR, LAFB 82bpm.  Assessment/Plan Active Problems:   Acute pancreatitis    Abdominal pain with nausea suspicious of acute pancreatitis versus Covid -Plan to keep n.p.o. except ice chips -Aggressive IV fluid hydration ongoing after further 1 L bolus -Pain management with Dilaudid as needed -Zofran for any nausea or vomiting -Continue to monitor repeat labs -CT imaging once available -Check lipid panel -Does not appear to be on any medications that would induce pancreatitis -No gallstones appreciated on abdominal ultrasound and no recent alcohol use  Covid positive -Patient could be symptomatic from his current infection -He was not noted to have any significant hypoxemia and appears to be on 2 L nasal cannula for comfort, therefore plan to avoid steroids -Monitor inflammatory markers -Start remdesivir -Continue on isolation precautions  Mild hyponatremia -Likely related to dehydration -Plan to continue on normal saline infusion -Repeat a.m. labs  Mild hypokalemia -Replete IV -Recheck in a.m. along with magnesium  Psychiatric disorder -Patient with noted bipolar 1, schizophrenia, and depression -Unfortunately, I do not see medication list or any noted medications that this patient is on for these disorders -He will need psychiatric follow-up after he is medically cleared -Ativan for agitation based on listed allergy  Thrombocytopenia -Unknown etiology -Remain off heparin agents and follow CBC for now -No active bleeding otherwise noted  DVT prophylaxis: SCDs Code Status: Full code Family Communication:None at bedside; security guard present  Disposition Plan:Admit for treatment of COVID/possible pancreatitis Consults called:None Admission status: Inpatient, Tele   Bruce Dipaola D Tessi Eustache DO [facility]  If 7PM-7AM, please contact  night-coverage [email or web address]  07/24/2020, 1:48 PM

## 2020-07-24 NOTE — ED Notes (Signed)
Pt demanding CT scan stating he has something living in his stomach and throat.  Pt demanding to know who his doctor is and asking various questions regarding his care provider.

## 2020-07-24 NOTE — ED Triage Notes (Signed)
Per EMS, pt from jail and reports was found unresponsive. Pt arousable at time of EMS arrival. Per EMS, pt not eating drinking yesterday. Pt alert and responsive but not cooperative with answering assessment questions.pt points to abdomen when asked about pain, not sure when symptoms started. Pt poor historian. cbg en route [address].   Per jail nurse, pt stopped taking and refused medication in April of 2020. Pt not answering triage questions. Pt able to follow commands. Airway patent.   [place] staff at bedside with pt.

## 2020-07-25 ENCOUNTER — Inpatient Hospital Stay (HOSPITAL_COMMUNITY)

## 2020-07-25 ENCOUNTER — Encounter (HOSPITAL_COMMUNITY): Payer: Self-pay | Admitting: Internal Medicine

## 2020-07-25 DIAGNOSIS — D696 Thrombocytopenia, unspecified: Secondary | ICD-10-CM

## 2020-07-25 DIAGNOSIS — G9341 Metabolic encephalopathy: Secondary | ICD-10-CM

## 2020-07-25 DIAGNOSIS — E871 Hypo-osmolality and hyponatremia: Secondary | ICD-10-CM

## 2020-07-25 DIAGNOSIS — U071 COVID-19: Principal | ICD-10-CM

## 2020-07-25 DIAGNOSIS — R627 Adult failure to thrive: Secondary | ICD-10-CM

## 2020-07-25 LAB — FERRITIN: Ferritin: 815 ng/mL — ABNORMAL HIGH (ref 24–336)

## 2020-07-25 LAB — C-REACTIVE PROTEIN: CRP: 2 mg/dL — ABNORMAL HIGH (ref <1.0)

## 2020-07-25 LAB — COMPREHENSIVE METABOLIC PANEL WITH GFR
ALT: 12 U/L (ref 0–44)
AST: 17 U/L (ref 15–41)
Albumin: 2.9 g/dL — ABNORMAL LOW (ref 3.5–5.0)
Alkaline Phosphatase: 38 U/L (ref 38–126)
Anion gap: 8 (ref 5–15)
BUN: 11 mg/dL (ref 8–23)
CO2: 27 mmol/L (ref 22–32)
Calcium: 7.8 mg/dL — ABNORMAL LOW (ref 8.9–10.3)
Chloride: 96 mmol/L — ABNORMAL LOW (ref 98–111)
Creatinine, Ser: 0.72 mg/dL (ref 0.61–1.24)
GFR, Estimated: >60 mL/min
Glucose, Bld: 78 mg/dL (ref 70–99)
Potassium: 3.7 mmol/L (ref 3.5–5.1)
Sodium: 131 mmol/L — ABNORMAL LOW (ref 135–145)
Total Bilirubin: 1.6 mg/dL — ABNORMAL HIGH (ref 0.3–1.2)
Total Protein: 5.4 g/dL — ABNORMAL LOW (ref 6.5–8.1)

## 2020-07-25 LAB — CBC WITH DIFFERENTIAL/PLATELET
Abs Immature Granulocytes: 0.01 10*3/uL (ref 0.00–0.07)
Basophils Absolute: 0 10*3/uL (ref 0.0–0.1)
Basophils Relative: 0 %
Eosinophils Absolute: 0 10*3/uL (ref 0.0–0.5)
Eosinophils Relative: 0 %
HCT: 41.2 % (ref 39.0–52.0)
Hemoglobin: 13.3 g/dL (ref 13.0–17.0)
Lymphocytes Relative: 28 %
Lymphs Abs: 0.6 10*3/uL — ABNORMAL LOW (ref 0.7–4.0)
MCH: 30.8 pg (ref 26.0–34.0)
MCHC: 32.3 g/dL (ref 30.0–36.0)
MCV: 95.4 fL (ref 80.0–100.0)
Monocytes Absolute: 0.2 10*3/uL (ref 0.1–1.0)
Monocytes Relative: 11 %
Neutro Abs: 1.3 10*3/uL — ABNORMAL LOW (ref 1.7–7.7)
Neutrophils Relative %: 60 %
Platelets: 82 10*3/uL — ABNORMAL LOW (ref 150–400)
Promyelocytes Relative: 1 %
RBC: 4.32 MIL/uL (ref 4.22–5.81)
RDW: 12.4 % (ref 11.5–15.5)
WBC: 2.2 10*3/uL — ABNORMAL LOW (ref 4.0–10.5)
nRBC: 0 % (ref 0.0–0.2)

## 2020-07-25 LAB — T4, FREE: Free T4: 1.32 ng/dL — ABNORMAL HIGH (ref 0.61–1.12)

## 2020-07-25 LAB — LIPASE, BLOOD: Lipase: 129 U/L — ABNORMAL HIGH (ref 11–51)

## 2020-07-25 LAB — PROCALCITONIN: Procalcitonin: <0.1 ng/mL

## 2020-07-25 LAB — MRSA PCR SCREENING: MRSA by PCR: NEGATIVE

## 2020-07-25 LAB — TSH: TSH: 0.408 u[IU]/mL (ref 0.350–4.500)

## 2020-07-25 LAB — MAGNESIUM: Magnesium: 2 mg/dL (ref 1.7–2.4)

## 2020-07-25 LAB — D-DIMER, QUANTITATIVE: D-Dimer, Quant: 1.17 ug{FEU}/mL — ABNORMAL HIGH (ref 0.00–0.50)

## 2020-07-25 LAB — FOLATE: Folate: 19.6 ng/mL

## 2020-07-25 LAB — VITAMIN B12: Vitamin B-12: 1393 pg/mL — ABNORMAL HIGH (ref 180–914)

## 2020-07-25 MED ORDER — POTASSIUM CHLORIDE IN NACL 20-0.9 MEQ/L-% IV SOLN: INTRAVENOUS | Status: DC

## 2020-07-25 MED ORDER — PANTOPRAZOLE SODIUM 40 MG PO TBEC
40.0000 mg | DELAYED_RELEASE_TABLET | Freq: Two times a day (BID) | ORAL | Status: DC
  Administered 2020-07-25 – 2020-07-27 (×6): 40 mg via ORAL
  Filled 2020-07-25 (×5): qty 1

## 2020-07-25 MED ORDER — PROSOURCE PLUS PO LIQD
30.0000 mL | Freq: Three times a day (TID) | ORAL | Status: DC
  Administered 2020-07-25 – 2020-07-27 (×6): 30 mL via ORAL
  Filled 2020-07-25 (×6): qty 30

## 2020-07-25 MED ORDER — IOHEXOL 9 MG/ML PO SOLN
ORAL | Status: AC
  Filled 2020-07-25: qty 1000

## 2020-07-25 MED ORDER — ENOXAPARIN SODIUM 40 MG/0.4ML ~~LOC~~ SOLN
40.0000 mg | SUBCUTANEOUS | Status: DC
  Administered 2020-07-25 – 2020-07-27 (×4): 40 mg via SUBCUTANEOUS
  Filled 2020-07-25 (×3): qty 0.4

## 2020-07-25 MED ORDER — ADULT MULTIVITAMIN W/MINERALS CH
1.0000 | ORAL_TABLET | Freq: Every day | ORAL | Status: DC
  Administered 2020-07-25 – 2020-07-27 (×4): 1 via ORAL
  Filled 2020-07-25 (×3): qty 1

## 2020-07-25 MED ORDER — BOOST / RESOURCE BREEZE PO LIQD CUSTOM
1.0000 | Freq: Three times a day (TID) | ORAL | Status: DC
  Administered 2020-07-25 – 2020-07-27 (×6): 1 via ORAL

## 2020-07-25 NOTE — Progress Notes (Signed)
Initial Nutrition Assessment  DOCUMENTATION CODES:   Not applicable  INTERVENTION:  Boost Breeze po TID, each supplement provides 250 kcal and 9 grams of protein  ProSouce 30 ml po TID, each supplement provides 100 kcal and 15 grams of protein  MVI with minerals  Pending CT results, consider placing NG/NJ tube for nutrition support  Pt is at high risk for refeeding, recommend monitoring magnesium, potassium, and phosphorus daily as diet advances and po intake improves.  NUTRITION DIAGNOSIS:   Inadequate oral intake related to poor appetite (abdominal pain) as evidenced by per patient/family report (as per H&P minimal oral intake x 2 weeks).    GOAL:   Patient will meet greater than or equal to 90% of their needs    MONITOR:   Labs, I & O's, Diet advancement, Supplement acceptance, PO intake, Weight trends  REASON FOR ASSESSMENT:   Malnutrition Screening Tool    ASSESSMENT:   64 year old male with history of bipolar one disorder, schizophrenia, polysubstance abuse, and depression presented from correctional facility after he was found unresponsive however arousable at EMS arrival and complaining of abdominal pain, poor oral intake, and sore throat with associated mild nonproductive cough. Pt tested positive for COVID-19 and admitted for acute pancreatitis.  RD working remotely.  US abdomen with mass suspicious for hemangioma.   Pt is from correctional facility and does not have access to inpatient phone. Unable to obtain nutrition history at this time. Per H&P he apparently has not been eating or drinking much of anything over the past 2 weeks. Diet advanced to clears this afternoon, noted 0% meal completion. Will order Boost Breeze and ProSource supplements to aid with meeting needs. Patient is at high risk for malnutrition given the above as well as increased calorie/protein needs secondary to COVID-19 infection. Unclear if pt truly as pancreatitis as lipase not  significantly elevated. CT abdomen and pelvis to be completed in the morning. If appropriate pending results, consider placement of NG/NJ tube for nutrition support.   Current wt 63.5 kg (139.7 lb) Mild pitting BLE edema noted. Last weight prior to admission 71.2 kg (156.64 lb) on 08/09/17.   Medications reviewed and include: Protonix, Remdesivir IVF: NaCl with KCl 20 mEq @ 150 ml/hr  Labs: Na 131 (L), K 3.7 (WNL) repleted, Mg 2.0 (WNL), Lipase 129 (H), WBC 2.2 (L)  NUTRITION - FOCUSED PHYSICAL EXAM: Unable to complete at this time, RD working remotely.  Diet Order:   Diet Order            Diet clear liquid Room service appropriate? Yes; Fluid consistency: Thin  Diet effective now                 EDUCATION NEEDS:   Not appropriate for education at this time  Skin:  Skin Assessment: Reviewed RN Assessment  Last BM:  unknown  Height:   Ht Readings from Last 1 Encounters:  07/25/20 5\' 11"  (1.803 m)    Weight:   Wt Readings from Last 1 Encounters:  07/25/20 63.5 kg    BMI:  Body mass index is 19.52 kg/m.  Estimated Nutritional Needs:   Kcal:  3151-7616 (30-35 kcal/kg)  Protein:  95-111  Fluid:  >/= 1.9 L/day   Lajuan Lines, RD, LDN Clinical Nutrition After Hours/Weekend Pager # in [place]

## 2020-07-25 NOTE — Progress Notes (Addendum)
PROGRESS NOTE  Bruce Goodman YQI:[id number] DOB: 01-Jan-1956 DOA: 07/24/2020 PCP: Pcp, No  Brief History:  64 year old male with a history of bipolar disorder and schizophrenia, polysubstance abuse presenting from his correctional facility when he was found to be unresponsive.  Upon EMS arrival, the patient was noted to be arousable.  By the time the patient arrived in the emergency department, he was awake and alert, conversant, but a bit uncooperative.  History was limited as the patient was a poor historian.  Labs revealed a sodium 129, potassium 3.2, serum creatinine 0.69 with unremarkable LFTs.  Lipase was 148.  WBC 2.5, hemoglobin 16.0, platelets 84,000.  The patient had a positive COVID-19 RT-PCR.  He was started on remdesivir.  Abdominal ultrasound was negative for gallstones or gallbladder wall thickening.  There was no biliary ductal dilatation.  Chest x-ray showed increased interstitial markings and right lower lobe opacity/atelectasis. On the morning of 07/25/2020, the patient was more awake and alert and conversant.  He has been complaining of a sore throat and nonproductive cough as well as shortness of breath for at least the past week.  He also complains of primarily epigastric abdominal pain for approximately 9 months.  He states that has not significantly worsened.  He has some nausea without any emesis.  He states that he has been constipated and has not had a bowel movement for a month.  He is passing flatus.  He denies any hematochezia, melena, dysuria, hematuria.  Patient was started on IV fluids.  CT of the abdomen and pelvis was delayed secondary to surgical difficulties.  It has been reordered for the morning of 07/25/2020.  Assessment/Plan: COVID-19 infection -Manifested with pulmonary and GI symptoms -He has ill-defined opacities on chest x-ray which was personally reviewed; also complaining of coughing or shortness of breath -Also has likely gastritis with  abdominal pain -Continue remdesivir for now -Follow inflammatory markers  Epigastric abdominal pain/elevated lipase -Unclear if the patient truly has pancreatitis as lipase is not elevated significantly -CT abdomen and pelvis -Remain n.p.o. pending CT abdomen and pelvis results -Start PPI -troponins 3>>4 -personally reviewed EKG--sinus, nonspecific Twave changes -continue IVF -triglycerides 74  Hyponatremia -Secondary to volume depletion and poor solute intake  Hypokalemia -Replete -Check magnesium  Thrombocytopenia -Serum L87 -Folic acid -TSH  Acute metabolic encephalopathy -Appears to be much improved -07/25/2020--patient is alert and oriented and conversant following commands    Status is: Inpatient  Remains inpatient appropriate because:IV treatments appropriate due to intensity of illness or inability to take PO   Dispo: The patient is from: correctional facility              Anticipated d/c is to: correctional facility              Anticipated d/c date is: 2 days              Patient currently:  Not medically stable        Family Communication:  no Family at bedside  Consultants:  none  Code Status:  FULL   DVT Prophylaxis:  [place] Lovenox   Procedures: As Listed in Progress Note Above  Antibiotics: None       Subjective: Patient complains of abdominal pain.  He has some nausea without emesis.  He denies any chest pain, shortness breath, hemoptysis.  There is no dysuria or hematuria.  He complains of constipation.  Objective: Vitals:   07/25/20 0144 07/25/20 0145 07/25/20  0200 07/25/20 0543  BP: 104/78  110/66 98/69  Pulse: 81 81 79 86  Resp: 20 (!) 24 (!) 22 17  Temp:   99.4 F (37.4 C) 98.4 F (36.9 C)  TempSrc:   Oral Oral  SpO2: 93% 99% 100% 98%  Weight:   63.5 kg   Height:   5\' 11"  (1.803 m)     Intake/Output Summary (Last 24 hours) at 07/25/2020 0732 Last data filed at 07/25/2020 0300 Gross per 24 hour  Intake 4236.96 ml   Output --  Net 4236.96 ml   Weight change:  Exam:   General:  Pt is alert, follows commands appropriately, not in acute distress  HEENT: No icterus, No thrush, No neck mass, [place]/AT  Cardiovascular: RRR, S1/S2, no rubs, no gallops  Respiratory: Diminished breath sounds bilateral.  Bibasilar rales.  No wheezing.  Abdomen: Soft/+BS, epigastric tender, non distended, no guarding  Extremities: No edema, No lymphangitis, No petechiae, No rashes, no synovitis   Data Reviewed: I have personally reviewed following labs and imaging studies Basic Metabolic Panel: Recent Labs  Lab 07/24/20 0805  NA 129*  K 3.2*  CL 91*  CO2 26  GLUCOSE 81  BUN 13  CREATININE 0.69  CALCIUM 7.4*   Liver Function Tests: Recent Labs  Lab 07/24/20 0805  AST 21  ALT 13  ALKPHOS 41  BILITOT 1.7*  PROT 6.3*  ALBUMIN 3.3*   Recent Labs  Lab 07/24/20 0805  LIPASE 148*   No results for input(s): AMMONIA in the last 168 hours. Coagulation Profile: No results for input(s): INR, PROTIME in the last 168 hours. CBC: Recent Labs  Lab 07/24/20 0805  WBC 2.5*  NEUTROABS 1.4*  HGB 16.0  HCT 45.6  MCV 90.7  PLT 84*   Cardiac Enzymes: No results for input(s): CKTOTAL, CKMB, CKMBINDEX, TROPONINI in the last 168 hours. BNP: Invalid input(s): POCBNP CBG: No results for input(s): GLUCAP in the last 168 hours. HbA1C: No results for input(s): HGBA1C in the last 72 hours. Urine analysis:    Component Value Date/Time   COLORURINE YELLOW 07/24/2020 0801   APPEARANCEUR CLEAR 07/24/2020 0801   APPEARANCEUR Clear 08/09/2017 1031   LABSPEC 1.010 07/24/2020 0801   PHURINE 6.0 07/24/2020 0801   GLUCOSEU NEGATIVE 07/24/2020 0801   HGBUR NEGATIVE 07/24/2020 0801   BILIRUBINUR NEGATIVE 07/24/2020 0801   BILIRUBINUR Negative 08/09/2017 1031   KETONESUR 40 (A) 07/24/2020 0801   PROTEINUR NEGATIVE 07/24/2020 0801   UROBILINOGEN 1.0 12/20/2012 1156   NITRITE NEGATIVE 07/24/2020 0801   LEUKOCYTESUR  NEGATIVE 07/24/2020 0801   Sepsis Labs: @LABRCNTIP (procalcitonin:4,lacticidven:4) ) Recent Results (from the past 240 hour(s))  Respiratory Panel by RT PCR (Flu A&B, Covid) - Nasopharyngeal Swab     Status: Abnormal   Collection Time: 07/24/20  8:09 AM   Specimen: Nasopharyngeal Swab  Result Value Ref Range Status   SARS Coronavirus 2 by RT PCR POSITIVE (A) NEGATIVE Final    Comment: RESULT CALLED TO, READ BACK BY AND VERIFIED WITH: FARMER E. @ 1052 ON [id number] BY HENDERSON L. (NOTE) SARS-CoV-2 target nucleic acids are DETECTED.  SARS-CoV-2 RNA is generally detectable in upper respiratory specimens  during the acute phase of infection. Positive results are indicative of the presence of the identified virus, but do not rule out bacterial infection or co-infection with other pathogens not detected by the test. Clinical correlation with patient history and other diagnostic information is necessary to determine patient infection status. The expected result is Negative.  Fact Sheet  for Patients:  PinkCheek.be  Fact Sheet for Healthcare Providers: GravelBags.it  This test is not yet approved or cleared by the [place] FDA and  has been authorized for detection and/or diagnosis of SARS-CoV-2 by FDA under an Emergency Use Authorization (EUA).  This EUA will remain in effect (meaning this test  can be used) for the duration of  the COVID-19 declaration under Section 564(b)(1) of the Act, 21 U.S.C. section 360bbb-3(b)(1), unless the authorization is terminated or revoked sooner.      Influenza A by PCR NEGATIVE NEGATIVE Final   Influenza B by PCR NEGATIVE NEGATIVE Final    Comment: (NOTE) The Xpert Xpress SARS-CoV-2/FLU/RSV assay is intended as an aid in  the diagnosis of influenza from Nasopharyngeal swab specimens and  should not be used as a sole basis for treatment. Nasal washings and  aspirates are unacceptable for  Xpert Xpress SARS-CoV-2/FLU/RSV  testing.  Fact Sheet for Patients: PinkCheek.be  Fact Sheet for Healthcare Providers: GravelBags.it  This test is not yet approved or cleared by the [place] FDA and  has been authorized for detection and/or diagnosis of SARS-CoV-2 by  FDA under an Emergency Use Authorization (EUA). This EUA will remain  in effect (meaning this test can be used) for the duration of the  Covid-19 declaration under Section 564(b)(1) of the Act, 21  U.S.C. section 360bbb-3(b)(1), unless the authorization is  terminated or revoked. Performed at [facility], [address], [place], [place] [address]      Scheduled Meds: . pantoprazole  40 mg Oral BID   Continuous Infusions: . sodium chloride 150 mL/hr at 07/25/20 0151  . remdesivir 100 mg in NS 100 mL      Procedures/Studies: US Abdomen Complete  Result Date: 07/24/2020 CLINICAL DATA:  Abdominal pain EXAM: ABDOMEN ULTRASOUND COMPLETE COMPARISON:  CT 02/12/2016 FINDINGS: Gallbladder: No gallstones or wall thickening visualized. No sonographic Murphy sign noted by sonographer. Common bile duct: Diameter: 4.9 mm Liver: No focal lesion identified. Within normal limits in parenchymal echogenicity. Portal vein is patent on color Doppler imaging with normal direction of blood flow towards the liver. IVC: No abnormality visualized. Pancreas: Visualized portion unremarkable. Spleen: Splenic size normal 8.8 mm in length. Hyperechoic region in the central spleen measuring 13 x 35 mm. This is not seen on the prior CT. Right Kidney: Length: 10.4 cm. Echogenicity within normal limits. No mass or hydronephrosis visualized. Left Kidney: Length: 9.5 cm. Echogenicity within normal limits. No mass or hydronephrosis visualized. Abdominal aorta: No aneurysm visualized. Other findings: None. IMPRESSION: Negative for gallstones.  Normal liver Echogenic focus in the central liver may  represent fat in the splenic hilum or hemangioma. No lesion visualized on prior CT 2017. Electronically Signed   By: Franchot Gallo M.D.   On: 07/24/2020 11:40   DG Chest Port 1 View  Result Date: 07/24/2020 CLINICAL DATA:  Shortness of breath EXAM: PORTABLE CHEST 1 VIEW COMPARISON:  12/16/2013 rib/chest radiographs and prior. FINDINGS: Telemetry wires overlie the chest. No focal consolidation, pneumothorax or pleural effusion. Cardiomediastinal silhouette within normal limits. Right acromioclavicular osteoarthrosis. IMPRESSION: No focal airspace disease. Electronically Signed   By: Primitivo Gauze M.D.   On: 07/24/2020 08:28    Orson Eva, DO  [facility]  If 7PM-7AM, please contact night-coverage [email or web address] Password [email or web address] 07/25/2020, 7:32 AM   LOS: 1 day

## 2020-07-25 NOTE — Progress Notes (Signed)
Pt was originally on 2L of oxygen therapy via nasal canula, Now on room air sats= 99%. Tolerating well.

## 2020-07-25 NOTE — Plan of Care (Signed)
  Problem: Education: Goal: Knowledge of General Education information will improve Description: Including pain rating scale, medication(s)/side effects and non-pharmacologic comfort measures Outcome: Not Progressing   Problem: Health Behavior/Discharge Planning: Goal: Ability to manage health-related needs will improve Outcome: Not Progressing   Problem: Nutrition: Goal: Adequate nutrition will be maintained Outcome: Not Progressing   

## 2020-07-26 LAB — CBC WITH DIFFERENTIAL/PLATELET
Abs Immature Granulocytes: 0.02 10*3/uL (ref 0.00–0.07)
Basophils Absolute: 0 10*3/uL (ref 0.0–0.1)
Basophils Relative: 0 %
Eosinophils Absolute: 0 10*3/uL (ref 0.0–0.5)
Eosinophils Relative: 1 %
HCT: 43.8 % (ref 39.0–52.0)
Hemoglobin: 14.7 g/dL (ref 13.0–17.0)
Immature Granulocytes: 1 %
Lymphocytes Relative: 35 %
Lymphs Abs: 0.9 10*3/uL (ref 0.7–4.0)
MCH: 31.5 pg (ref 26.0–34.0)
MCHC: 33.6 g/dL (ref 30.0–36.0)
MCV: 94 fL (ref 80.0–100.0)
Monocytes Absolute: 0.4 10*3/uL (ref 0.1–1.0)
Monocytes Relative: 15 %
Neutro Abs: 1.2 10*3/uL — ABNORMAL LOW (ref 1.7–7.7)
Neutrophils Relative %: 48 %
Platelets: 101 10*3/uL — ABNORMAL LOW (ref 150–400)
RBC: 4.66 MIL/uL (ref 4.22–5.81)
RDW: 12.8 % (ref 11.5–15.5)
WBC: 2.5 10*3/uL — ABNORMAL LOW (ref 4.0–10.5)
nRBC: 0 % (ref 0.0–0.2)

## 2020-07-26 LAB — LIPASE, BLOOD: Lipase: 126 U/L — ABNORMAL HIGH (ref 11–51)

## 2020-07-26 LAB — BILIRUBIN, FRACTIONATED(TOT/DIR/INDIR)
Bilirubin, Direct: 0.3 mg/dL — ABNORMAL HIGH (ref 0.0–0.2)
Indirect Bilirubin: 1 mg/dL — ABNORMAL HIGH (ref 0.3–0.9)
Total Bilirubin: 1.3 mg/dL — ABNORMAL HIGH (ref 0.3–1.2)

## 2020-07-26 LAB — COMPREHENSIVE METABOLIC PANEL
ALT: 12 U/L (ref 0–44)
AST: 19 U/L (ref 15–41)
Albumin: 3.1 g/dL — ABNORMAL LOW (ref 3.5–5.0)
Alkaline Phosphatase: 41 U/L (ref 38–126)
Anion gap: 6 (ref 5–15)
BUN: 8 mg/dL (ref 8–23)
CO2: 27 mmol/L (ref 22–32)
Calcium: 8 mg/dL — ABNORMAL LOW (ref 8.9–10.3)
Chloride: 102 mmol/L (ref 98–111)
Creatinine, Ser: 0.54 mg/dL — ABNORMAL LOW (ref 0.61–1.24)
GFR, Estimated: >60 mL/min (ref >60)
Glucose, Bld: 102 mg/dL — ABNORMAL HIGH (ref 70–99)
Potassium: 4.1 mmol/L (ref 3.5–5.1)
Sodium: 135 mmol/L (ref 135–145)
Total Bilirubin: 1.1 mg/dL (ref 0.3–1.2)
Total Protein: 5.8 g/dL — ABNORMAL LOW (ref 6.5–8.1)

## 2020-07-26 LAB — MAGNESIUM: Magnesium: 2.2 mg/dL (ref 1.7–2.4)

## 2020-07-26 LAB — C-REACTIVE PROTEIN: CRP: 1.4 mg/dL — ABNORMAL HIGH (ref <1.0)

## 2020-07-26 LAB — D-DIMER, QUANTITATIVE: D-Dimer, Quant: 0.83 ug/mL-FEU — ABNORMAL HIGH (ref 0.00–0.50)

## 2020-07-26 LAB — FERRITIN: Ferritin: 807 ng/mL — ABNORMAL HIGH (ref 24–336)

## 2020-07-26 NOTE — Progress Notes (Signed)
PROGRESS NOTE  Bruce Goodman JKD:[id number] DOB: 1955-12-21 DOA: 07/24/2020 PCP: Pcp, No  Brief History:  64 year old male with a history of bipolar disorder and schizophrenia, polysubstance abuse presenting from his correctional facility when he was found to be unresponsive.  Upon EMS arrival, the patient was noted to be arousable.  By the time the patient arrived in the emergency department, he was awake and alert, conversant, but a bit uncooperative.  History was limited as the patient was a poor historian.  Labs revealed a sodium 129, potassium 3.2, serum creatinine 0.69 with unremarkable LFTs.  Lipase was 148.  WBC 2.5, hemoglobin 16.0, platelets 84,000.  The patient had a positive COVID-19 RT-PCR.  He was started on remdesivir.  Abdominal ultrasound was negative for gallstones or gallbladder wall thickening.  There was no biliary ductal dilatation.  Chest x-ray showed increased interstitial markings and right lower lobe opacity/atelectasis. On the morning of 07/25/2020, the patient was more awake and alert and conversant.  He has been complaining of a sore throat and nonproductive cough as well as shortness of breath for at least the past week.  He also complains of primarily epigastric abdominal pain for approximately 9 months.  He states that has not significantly worsened.  He has some nausea without any emesis.  He states that he has been constipated and has not had a bowel movement for a month.  He is passing flatus.  He denies any hematochezia, melena, dysuria, hematuria.  Patient was started on IV fluids.  CT of the abdomen and pelvis was delayed secondary to surgical difficulties.  It has been reordered for the morning of 07/25/2020.  Assessment/Plan: COVID-19 infection -Manifested with pulmonary and GI symptoms -He has ill-defined opacities on chest x-ray which was personally reviewed; also complaining of coughing or shortness of breath -Also has likely gastritis with  abdominal pain -Continue remdesivir for now -CRP 2.0>>1.4 -D-dimer--1.17>>0.83 -ferritin 815>>807  Epigastric abdominal pain/elevated lipase -Unclear if the patient truly has pancreatitis as lipase is not elevated significantly -CT abdomen and pelvis--Ground-glass nodules at the lung bases concern for early COVID pneumonia; normal GB and normal pancrease -advance to full liquids -Continue PPI -troponins 3>>4 -personally reviewed EKG--sinus, nonspecific Twave changes -continue IVF -triglycerides 74  Hyponatremia -Secondary to volume depletion and poor solute intake -continue IVF  Hypokalemia -Repleted -Check magnesium--2.2  Thrombocytopenia -Serum K99--8338 -Folic SNKN--39.7 -QBH--4.193 -due to COVID-19 infection  Acute metabolic encephalopathy -Appears to be much improved -07/25/2020--patient is alert and oriented and conversant following commands    Status is: Inpatient  Remains inpatient appropriate because:IV treatments appropriate due to intensity of illness or inability to take PO   Dispo: The patient is from: correctional facility  Anticipated d/c is to: correctional facility  Anticipated d/c date is: 2 days  Patient currently:  Not medically stable        Family Communication:  no Family at bedside  Consultants:  none  Code Status:  FULL   DVT Prophylaxis:  [place] Lovenox   Procedures: As Listed in Progress Note Above  Antibiotics: None     Subjective: Patient states abd pain is improving but abd still feels funny/unsettled.  Denies f/c, cp, sob, n/v.  +loose stool without hematochezia or melena  Objective: Vitals:   07/25/20 2129 07/26/20 0503 07/26/20 0810 07/26/20 0810  BP: 95/61 (!) 94/56  94/61  Pulse: 70 69  66  Resp: 18 18  18   Temp: 98.2 F (36.8 C)  97.6 F (36.4 C) 97.8 F (36.6 C)   TempSrc: Oral Oral Oral   SpO2: 98% 98%  99%  Weight:      Height:         Intake/Output Summary (Last 24 hours) at 07/26/2020 1321 Last data filed at 07/26/2020 1034 Gross per 24 hour  Intake 4582.12 ml  Output 4000 ml  Net 582.12 ml   Weight change:  Exam:   General:  Pt is alert, follows commands appropriately, not in acute distress  HEENT: No icterus, No thrush, No neck mass, Echo/AT; TMs without erythema or buldging.  No ear canal lesions  Cardiovascular: RRR, S1/S2, no rubs, no gallops  Respiratory: bibasilar rales. No wheeze  Abdomen: Soft/+BS, non tender, non distended, no guarding  Extremities: No edema, No lymphangitis, No petechiae, No rashes, no synovitis   Data Reviewed: I have personally reviewed following labs and imaging studies Basic Metabolic Panel: Recent Labs  Lab 07/24/20 0805 07/25/20 0622 07/26/20 0728  NA 129* 131* 135  K 3.2* 3.7 4.1  CL 91* 96* 102  CO2 26 27 27   GLUCOSE 81 78 102*  BUN 13 11 8   CREATININE 0.69 0.72 0.54*  CALCIUM 7.4* 7.8* 8.0*  MG  --  2.0 2.2   Liver Function Tests: Recent Labs  Lab 07/24/20 0805 07/25/20 0622 07/26/20 0728  AST 21 17 19   ALT 13 12 12   ALKPHOS 41 38 41  BILITOT 1.7* 1.6* 1.3*  1.1  PROT 6.3* 5.4* 5.8*  ALBUMIN 3.3* 2.9* 3.1*   Recent Labs  Lab 07/24/20 0805 07/25/20 0622 07/26/20 0728  LIPASE 148* 129* 126*   No results for input(s): AMMONIA in the last 168 hours. Coagulation Profile: No results for input(s): INR, PROTIME in the last 168 hours. CBC: Recent Labs  Lab 07/24/20 0805 07/25/20 0622 07/26/20 0728  WBC 2.5* 2.2* 2.5*  NEUTROABS 1.4* 1.3* 1.2*  HGB 16.0 13.3 14.7  HCT 45.6 41.2 43.8  MCV 90.7 95.4 94.0  PLT 84* 82* 101*   Cardiac Enzymes: No results for input(s): CKTOTAL, CKMB, CKMBINDEX, TROPONINI in the last 168 hours. BNP: Invalid input(s): POCBNP CBG: No results for input(s): GLUCAP in the last 168 hours. HbA1C: No results for input(s): HGBA1C in the last 72 hours. Urine analysis:    Component Value Date/Time   COLORURINE  YELLOW 07/24/2020 0801   APPEARANCEUR CLEAR 07/24/2020 0801   APPEARANCEUR Clear 08/09/2017 1031   LABSPEC 1.010 07/24/2020 0801   PHURINE 6.0 07/24/2020 0801   GLUCOSEU NEGATIVE 07/24/2020 0801   HGBUR NEGATIVE 07/24/2020 0801   BILIRUBINUR NEGATIVE 07/24/2020 0801   BILIRUBINUR Negative 08/09/2017 1031   KETONESUR 40 (A) 07/24/2020 0801   PROTEINUR NEGATIVE 07/24/2020 0801   UROBILINOGEN 1.0 12/20/2012 1156   NITRITE NEGATIVE 07/24/2020 0801   LEUKOCYTESUR NEGATIVE 07/24/2020 0801   Sepsis Labs: @LABRCNTIP (procalcitonin:4,lacticidven:4) ) Recent Results (from the past 240 hour(s))  Respiratory Panel by RT PCR (Flu A&B, Covid) - Nasopharyngeal Swab     Status: Abnormal   Collection Time: 07/24/20  8:09 AM   Specimen: Nasopharyngeal Swab  Result Value Ref Range Status   SARS Coronavirus 2 by RT PCR POSITIVE (A) NEGATIVE Final    Comment: RESULT CALLED TO, READ BACK BY AND VERIFIED WITH: FARMER E. @ 1052 ON [id number] BY HENDERSON L. (NOTE) SARS-CoV-2 target nucleic acids are DETECTED.  SARS-CoV-2 RNA is generally detectable in upper respiratory specimens  during the acute phase of infection. Positive results are indicative of the presence of the identified  virus, but do not rule out bacterial infection or co-infection with other pathogens not detected by the test. Clinical correlation with patient history and other diagnostic information is necessary to determine patient infection status. The expected result is Negative.  Fact Sheet for Patients:  PinkCheek.be  Fact Sheet for Healthcare Providers: GravelBags.it  This test is not yet approved or cleared by the [place] FDA and  has been authorized for detection and/or diagnosis of SARS-CoV-2 by FDA under an Emergency Use Authorization (EUA).  This EUA will remain in effect (meaning this test  can be used) for the duration of  the COVID-19 declaration under Section  564(b)(1) of the Act, 21 U.S.C. section 360bbb-3(b)(1), unless the authorization is terminated or revoked sooner.      Influenza A by PCR NEGATIVE NEGATIVE Final   Influenza B by PCR NEGATIVE NEGATIVE Final    Comment: (NOTE) The Xpert Xpress SARS-CoV-2/FLU/RSV assay is intended as an aid in  the diagnosis of influenza from Nasopharyngeal swab specimens and  should not be used as a sole basis for treatment. Nasal washings and  aspirates are unacceptable for Xpert Xpress SARS-CoV-2/FLU/RSV  testing.  Fact Sheet for Patients: PinkCheek.be  Fact Sheet for Healthcare Providers: GravelBags.it  This test is not yet approved or cleared by the [place] FDA and  has been authorized for detection and/or diagnosis of SARS-CoV-2 by  FDA under an Emergency Use Authorization (EUA). This EUA will remain  in effect (meaning this test can be used) for the duration of the  Covid-19 declaration under Section 564(b)(1) of the Act, 21  U.S.C. section 360bbb-3(b)(1), unless the authorization is  terminated or revoked. Performed at [facility], [address] [place], [place] [address]   MRSA PCR Screening     Status: None   Collection Time: 07/25/20  2:22 AM   Specimen: Nasal Mucosa; Nasopharyngeal  Result Value Ref Range Status   MRSA by PCR NEGATIVE NEGATIVE Final    Comment:        The GeneXpert MRSA Assay (FDA approved for NASAL specimens only), is one component of a comprehensive MRSA colonization surveillance program. It is not intended to diagnose MRSA infection nor to guide or monitor treatment for MRSA infections. Performed at [facility], [address], [place],  [address]      Scheduled Meds: . (feeding supplement) PROSource Plus  30 mL Oral TID BM  . enoxaparin (LOVENOX) injection  40 mg Subcutaneous Q24H  . feeding supplement  1 Container Oral TID BM  . multivitamin with minerals  1 tablet Oral Daily    . pantoprazole  40 mg Oral BID   Continuous Infusions: . 0.9 % NaCl with KCl 20 mEq / L 150 mL/hr at 07/26/20 1313  . remdesivir 100 mg in NS 100 mL 100 mg (07/26/20 0854)    Procedures/Studies: US Abdomen Complete  Result Date: 07/24/2020 CLINICAL DATA:  Abdominal pain EXAM: ABDOMEN ULTRASOUND COMPLETE COMPARISON:  CT 02/12/2016 FINDINGS: Gallbladder: No gallstones or wall thickening visualized. No sonographic Murphy sign noted by sonographer. Common bile duct: Diameter: 4.9 mm Liver: No focal lesion identified. Within normal limits in parenchymal echogenicity. Portal vein is patent on color Doppler imaging with normal direction of blood flow towards the liver. IVC: No abnormality visualized. Pancreas: Visualized portion unremarkable. Spleen: Splenic size normal 8.8 mm in length. Hyperechoic region in the central spleen measuring 13 x 35 mm. This is not seen on the prior CT. Right Kidney: Length: 10.4 cm. Echogenicity within normal  limits. No mass or hydronephrosis visualized. Left Kidney: Length: 9.5 cm. Echogenicity within normal limits. No mass or hydronephrosis visualized. Abdominal aorta: No aneurysm visualized. Other findings: None. IMPRESSION: Negative for gallstones.  Normal liver Echogenic focus in the central liver may represent fat in the splenic hilum or hemangioma. No lesion visualized on prior CT 2017. Electronically Signed   By: Franchot Gallo M.D.   On: 07/24/2020 11:40   CT ABDOMEN PELVIS W CONTRAST  Result Date: 07/25/2020 CLINICAL DATA:  Epigastric pain.  Pancreatitis. EXAM: CT ABDOMEN AND PELVIS WITH CONTRAST TECHNIQUE: Multidetector CT imaging of the abdomen and pelvis was performed using the standard protocol following bolus administration of intravenous contrast. CONTRAST:  42mL OMNIPAQUE IOHEXOL 300 MG/ML  SOLN COMPARISON:  Ultrasound 07/24/2020, CT 02/12/2016 FINDINGS: Lower chest: Rounded ground-glass nodules at the lung bases. Hepatobiliary: No focal hepatic lesion. No  biliary duct dilatation. Common bile duct is normal. Pancreas: Pancreas is normal. No ductal dilatation. No pancreatic inflammation. Spleen: Normal spleen Adrenals/urinary tract: Adrenal glands and kidneys are normal. The ureters and bladder normal. Stomach/Bowel: Stomach, small bowel, appendix, and cecum are normal. The colon and rectosigmoid colon are normal. Vascular/Lymphatic: Abdominal aorta is normal caliber. No periportal or retroperitoneal adenopathy. No pelvic adenopathy. Reproductive: Prostate normal Other: No free fluid. Musculoskeletal: No aggressive osseous lesion. IMPRESSION: 1. Ground-glass nodules at the lung bases concern for early COVID viral pneumonia. 2. Normal gallbladder and pancreas by CT imaging. Electronically Signed   By: Suzy Bouchard M.D.   On: 07/25/2020 13:31   DG Chest Port 1 View  Result Date: 07/24/2020 CLINICAL DATA:  Shortness of breath EXAM: PORTABLE CHEST 1 VIEW COMPARISON:  12/16/2013 rib/chest radiographs and prior. FINDINGS: Telemetry wires overlie the chest. No focal consolidation, pneumothorax or pleural effusion. Cardiomediastinal silhouette within normal limits. Right acromioclavicular osteoarthrosis. IMPRESSION: No focal airspace disease. Electronically Signed   By: Primitivo Gauze M.D.   On: 07/24/2020 08:28    Orson Eva, DO  [facility]  If 7PM-7AM, please contact night-coverage [email or web address] Password [email or web address] 07/26/2020, 1:21 PM   LOS: 2 days

## 2020-07-27 LAB — CBC WITH DIFFERENTIAL/PLATELET
Abs Immature Granulocytes: 0.02 10*3/uL (ref 0.00–0.07)
Basophils Absolute: 0 10*3/uL (ref 0.0–0.1)
Basophils Relative: 1 %
Eosinophils Absolute: 0 10*3/uL (ref 0.0–0.5)
Eosinophils Relative: 0 %
HCT: 45.9 % (ref 39.0–52.0)
Hemoglobin: 15.3 g/dL (ref 13.0–17.0)
Immature Granulocytes: 1 %
Lymphocytes Relative: 21 %
Lymphs Abs: 0.8 10*3/uL (ref 0.7–4.0)
MCH: 31.1 pg (ref 26.0–34.0)
MCHC: 33.3 g/dL (ref 30.0–36.0)
MCV: 93.3 fL (ref 80.0–100.0)
Monocytes Absolute: 0.3 10*3/uL (ref 0.1–1.0)
Monocytes Relative: 7 %
Neutro Abs: 2.9 10*3/uL (ref 1.7–7.7)
Neutrophils Relative %: 70 %
Platelets: 136 10*3/uL — ABNORMAL LOW (ref 150–400)
RBC: 4.92 MIL/uL (ref 4.22–5.81)
RDW: 12.8 % (ref 11.5–15.5)
WBC: 4.1 10*3/uL (ref 4.0–10.5)
nRBC: 0 % (ref 0.0–0.2)

## 2020-07-27 LAB — COMPREHENSIVE METABOLIC PANEL
ALT: 14 U/L (ref 0–44)
AST: 20 U/L (ref 15–41)
Albumin: 3.2 g/dL — ABNORMAL LOW (ref 3.5–5.0)
Alkaline Phosphatase: 49 U/L (ref 38–126)
Anion gap: 7 (ref 5–15)
BUN: 6 mg/dL — ABNORMAL LOW (ref 8–23)
CO2: 27 mmol/L (ref 22–32)
Calcium: 8.4 mg/dL — ABNORMAL LOW (ref 8.9–10.3)
Chloride: 102 mmol/L (ref 98–111)
Creatinine, Ser: 0.53 mg/dL — ABNORMAL LOW (ref 0.61–1.24)
GFR, Estimated: >60 mL/min (ref >60)
Glucose, Bld: 104 mg/dL — ABNORMAL HIGH (ref 70–99)
Potassium: 3.9 mmol/L (ref 3.5–5.1)
Sodium: 136 mmol/L (ref 135–145)
Total Bilirubin: 1 mg/dL (ref 0.3–1.2)
Total Protein: 6.1 g/dL — ABNORMAL LOW (ref 6.5–8.1)

## 2020-07-27 LAB — C-REACTIVE PROTEIN: CRP: 0.8 mg/dL (ref <1.0)

## 2020-07-27 LAB — FERRITIN: Ferritin: 815 ng/mL — ABNORMAL HIGH (ref 24–336)

## 2020-07-27 LAB — PHOSPHORUS: Phosphorus: 1.7 mg/dL — ABNORMAL LOW (ref 2.5–4.6)

## 2020-07-27 LAB — MAGNESIUM: Magnesium: 2.1 mg/dL (ref 1.7–2.4)

## 2020-07-27 LAB — D-DIMER, QUANTITATIVE: D-Dimer, Quant: 0.89 ug/mL-FEU — ABNORMAL HIGH (ref 0.00–0.50)

## 2020-07-27 MED ORDER — K PHOS MONO-SOD PHOS DI & MONO 155-852-130 MG PO TABS
500.0000 mg | ORAL_TABLET | Freq: Three times a day (TID) | ORAL | Status: DC
  Administered 2020-07-27: 500 mg via ORAL
  Filled 2020-07-27: qty 2

## 2020-07-27 MED ORDER — PANTOPRAZOLE SODIUM 40 MG PO TBEC: 40.0000 mg | DELAYED_RELEASE_TABLET | Freq: Two times a day (BID) | ORAL | 1 refills | Status: AC

## 2020-07-27 MED ORDER — PROSOURCE PLUS PO LIQD: 30.0000 mL | Freq: Three times a day (TID) | ORAL | Status: AC

## 2020-07-27 MED ORDER — K PHOS MONO-SOD PHOS DI & MONO 155-852-130 MG PO TABS: 500.0000 mg | ORAL_TABLET | Freq: Three times a day (TID) | ORAL | Status: AC

## 2020-07-27 NOTE — Discharge Summary (Signed)
Physician Discharge Summary  Bruce Goodman TFT:[id number] DOB: August 22, 1956 DOA: 07/24/2020  PCP: Merryl Hacker, No  Admit date: 07/24/2020 Discharge date: 07/27/2020  Admitted From: [place] facility Disposition:  [facility] facility  Recommendations for Outpatient Follow-up:  1. Follow up with PCP in 1-2 weeks 2. Please obtain CMP/CBC, phos in one week     Discharge Condition: Stable CODE STATUS: FULL Diet recommendation: soft   Brief/Interim Summary: 64 year old male with a history of bipolar disorder and schizophrenia, polysubstance abuse presenting from his correctional facility when he was found to be unresponsive. Upon EMS arrival, the patient was noted to be arousable. By the time the patient arrived in the emergency department, he was awake and alert, conversant, but a bit uncooperative. History was limited as the patient was a poor historian. Labs revealed a sodium 129, potassium 3.2, serum creatinine 0.69 with unremarkable LFTs. Lipase was 148. WBC 2.5, hemoglobin 16.0, platelets 84,000. The patient had a positive COVID-19 RT-PCR. He was started on remdesivir. Abdominal ultrasound was negative for gallstones or gallbladder wall thickening. There was no biliary ductal dilatation. Chest x-ray showed increased interstitial markings and right lower lobe opacity/atelectasis. On the morning of 07/25/2020, the patient was more awake and alert and conversant. He has been complaining of a sore throat and nonproductive cough as well as shortness of breath for at least the past week. He also complains of primarily epigastric abdominal pain for approximately 9 months. He states that has not significantly worsened. He has some nausea without any emesis. He states that he has been constipated and has not had a bowel movement for a month. He is passing flatus. He denies any hematochezia, melena, dysuria, hematuria. Patient was started on IV fluids. CT of  the abdomen and pelvis was delayed secondary to surgical difficulties. It has been reordered for the morning of 07/25/2020.  CT abd was unremarkable.  His diet was gradually advanced to a soft diet which he tolerated.  Discharge Diagnoses:  COVID-19 infection -Manifested with pulmonary and GI symptoms -He has ill-defined opacities on chest x-ray which was personally reviewed;also complaining of coughing or shortness of breath -Also has likely gastritis with abdominal pain -Continue remdesivir for now-->had 4 days during hospitalization -d/c back to correctional facility with one more day outpt remdesivir infusion--set up at [facility] on 10/26 at 10AM -CRP 2.0>>1.4>>0.8 -D-dimer--1.17>>0.83 -ferritin 815>>807>>815  Epigastric abdominal pain/elevated lipase -Unclear if the patient truly has pancreatitis as lipase is not elevated significantly -CT abdomen and pelvis--Ground-glass nodules at the lung bases concern for early COVID pneumonia; normal GB and normal pancrease -advance to full liquids>>soft diet which he tolerated -Continue PPI bid x 30 days, then once daily -troponins 3>>4 -personally reviewed EKG--sinus, nonspecific Twave changes -continue IVF -triglycerides 74  Hyponatremia -Secondary to volume depletion and poor solute intake -continue IVF>>improved  Hypokalemia -Repleted -Check magnesium--2.2  Thrombocytopenia -Serum H06--2376 -Folic EGBT--51.7 -OHY--0.737 -due to COVID-19 infection -improving daily -no signs of bleeding  Acute metabolic encephalopathy -Appears to be much improved -07/25/2020--patient is alert and oriented and conversant following commands  Hypophosphatemia -Kphos x 3 days after dc    Discharge Instructions   Allergies as of 07/27/2020      Reactions   Haldol [haloperidol Decanoate] Other (See Comments)   Alters mental status   Seroquel [quetiapine Fumerate] Other (See Comments)   UNKNOWN REACTION      Medication List      STOP taking these medications   Colcrys 0.6 MG tablet Generic drug: colchicine  trazodone 300 MG tablet Commonly known as: DESYREL     TAKE these medications   (feeding supplement) PROSource Plus liquid Take 30 mLs by mouth 3 (three) times daily between meals.   pantoprazole 40 MG tablet Commonly known as: PROTONIX Take 1 tablet (40 mg total) by mouth 2 (two) times daily.   phosphorus 155-852-130 MG tablet Commonly known as: K PHOS NEUTRAL Take 2 tablets (500 mg total) by mouth 3 (three) times daily. X 3 days       Allergies  Allergen Reactions  . Haldol [Haloperidol Decanoate] Other (See Comments)    Alters mental status  . Seroquel [Quetiapine Fumerate] Other (See Comments)    UNKNOWN REACTION    Consultations:  none   Procedures/Studies: US Abdomen Complete  Result Date: 07/24/2020 CLINICAL DATA:  Abdominal pain EXAM: ABDOMEN ULTRASOUND COMPLETE COMPARISON:  CT 02/12/2016 FINDINGS: Gallbladder: No gallstones or wall thickening visualized. No sonographic Murphy sign noted by sonographer. Common bile duct: Diameter: 4.9 mm Liver: No focal lesion identified. Within normal limits in parenchymal echogenicity. Portal vein is patent on color Doppler imaging with normal direction of blood flow towards the liver. IVC: No abnormality visualized. Pancreas: Visualized portion unremarkable. Spleen: Splenic size normal 8.8 mm in length. Hyperechoic region in the central spleen measuring 13 x 35 mm. This is not seen on the prior CT. Right Kidney: Length: 10.4 cm. Echogenicity within normal limits. No mass or hydronephrosis visualized. Left Kidney: Length: 9.5 cm. Echogenicity within normal limits. No mass or hydronephrosis visualized. Abdominal aorta: No aneurysm visualized. Other findings: None. IMPRESSION: Negative for gallstones.  Normal liver Echogenic focus in the central liver may represent fat in the splenic hilum or hemangioma. No lesion visualized on prior CT 2017.  Electronically Signed   By: Franchot Gallo M.D.   On: 07/24/2020 11:40   CT ABDOMEN PELVIS W CONTRAST  Result Date: 07/25/2020 CLINICAL DATA:  Epigastric pain.  Pancreatitis. EXAM: CT ABDOMEN AND PELVIS WITH CONTRAST TECHNIQUE: Multidetector CT imaging of the abdomen and pelvis was performed using the standard protocol following bolus administration of intravenous contrast. CONTRAST:  54mL OMNIPAQUE IOHEXOL 300 MG/ML  SOLN COMPARISON:  Ultrasound 07/24/2020, CT 02/12/2016 FINDINGS: Lower chest: Rounded ground-glass nodules at the lung bases. Hepatobiliary: No focal hepatic lesion. No biliary duct dilatation. Common bile duct is normal. Pancreas: Pancreas is normal. No ductal dilatation. No pancreatic inflammation. Spleen: Normal spleen Adrenals/urinary tract: Adrenal glands and kidneys are normal. The ureters and bladder normal. Stomach/Bowel: Stomach, small bowel, appendix, and cecum are normal. The colon and rectosigmoid colon are normal. Vascular/Lymphatic: Abdominal aorta is normal caliber. No periportal or retroperitoneal adenopathy. No pelvic adenopathy. Reproductive: Prostate normal Other: No free fluid. Musculoskeletal: No aggressive osseous lesion. IMPRESSION: 1. Ground-glass nodules at the lung bases concern for early COVID viral pneumonia. 2. Normal gallbladder and pancreas by CT imaging. Electronically Signed   By: Suzy Bouchard M.D.   On: 07/25/2020 13:31   DG Chest Port 1 View  Result Date: 07/24/2020 CLINICAL DATA:  Shortness of breath EXAM: PORTABLE CHEST 1 VIEW COMPARISON:  12/16/2013 rib/chest radiographs and prior. FINDINGS: Telemetry wires overlie the chest. No focal consolidation, pneumothorax or pleural effusion. Cardiomediastinal silhouette within normal limits. Right acromioclavicular osteoarthrosis. IMPRESSION: No focal airspace disease. Electronically Signed   By: Primitivo Gauze M.D.   On: 07/24/2020 08:28        Discharge Exam: Vitals:   07/26/20 2200 07/27/20  0600  BP: (!) 92/56 104/76  Pulse: 76 78  Resp: 18 18  Temp:  98 F (36.7 C)  SpO2: 99% 100%   Vitals:   07/26/20 1819 07/26/20 1822 07/26/20 2200 07/27/20 0600  BP: 107/74  (!) 92/56 104/76  Pulse: 75  76 78  Resp: 18  18 18   Temp:  (!) 97.4 F (36.3 C)  98 F (36.7 C)  TempSrc:  Axillary  Oral  SpO2: 100%  99% 100%  Weight:      Height:        General: Pt is alert, awake, not in acute distress Cardiovascular: RRR, S1/S2 +, no rubs, no gallops Respiratory: CTA bilaterally, no wheezing, no rhonchi Abdominal: Soft, NT, ND, bowel sounds + Extremities: no edema, no cyanosis   The results of significant diagnostics from this hospitalization (including imaging, microbiology, ancillary and laboratory) are listed below for reference.    Significant Diagnostic Studies: US Abdomen Complete  Result Date: 07/24/2020 CLINICAL DATA:  Abdominal pain EXAM: ABDOMEN ULTRASOUND COMPLETE COMPARISON:  CT 02/12/2016 FINDINGS: Gallbladder: No gallstones or wall thickening visualized. No sonographic Murphy sign noted by sonographer. Common bile duct: Diameter: 4.9 mm Liver: No focal lesion identified. Within normal limits in parenchymal echogenicity. Portal vein is patent on color Doppler imaging with normal direction of blood flow towards the liver. IVC: No abnormality visualized. Pancreas: Visualized portion unremarkable. Spleen: Splenic size normal 8.8 mm in length. Hyperechoic region in the central spleen measuring 13 x 35 mm. This is not seen on the prior CT. Right Kidney: Length: 10.4 cm. Echogenicity within normal limits. No mass or hydronephrosis visualized. Left Kidney: Length: 9.5 cm. Echogenicity within normal limits. No mass or hydronephrosis visualized. Abdominal aorta: No aneurysm visualized. Other findings: None. IMPRESSION: Negative for gallstones.  Normal liver Echogenic focus in the central liver may represent fat in the splenic hilum or hemangioma. No lesion visualized on prior CT  2017. Electronically Signed   By: Franchot Gallo M.D.   On: 07/24/2020 11:40   CT ABDOMEN PELVIS W CONTRAST  Result Date: 07/25/2020 CLINICAL DATA:  Epigastric pain.  Pancreatitis. EXAM: CT ABDOMEN AND PELVIS WITH CONTRAST TECHNIQUE: Multidetector CT imaging of the abdomen and pelvis was performed using the standard protocol following bolus administration of intravenous contrast. CONTRAST:  25mL OMNIPAQUE IOHEXOL 300 MG/ML  SOLN COMPARISON:  Ultrasound 07/24/2020, CT 02/12/2016 FINDINGS: Lower chest: Rounded ground-glass nodules at the lung bases. Hepatobiliary: No focal hepatic lesion. No biliary duct dilatation. Common bile duct is normal. Pancreas: Pancreas is normal. No ductal dilatation. No pancreatic inflammation. Spleen: Normal spleen Adrenals/urinary tract: Adrenal glands and kidneys are normal. The ureters and bladder normal. Stomach/Bowel: Stomach, small bowel, appendix, and cecum are normal. The colon and rectosigmoid colon are normal. Vascular/Lymphatic: Abdominal aorta is normal caliber. No periportal or retroperitoneal adenopathy. No pelvic adenopathy. Reproductive: Prostate normal Other: No free fluid. Musculoskeletal: No aggressive osseous lesion. IMPRESSION: 1. Ground-glass nodules at the lung bases concern for early COVID viral pneumonia. 2. Normal gallbladder and pancreas by CT imaging. Electronically Signed   By: Suzy Bouchard M.D.   On: 07/25/2020 13:31   DG Chest Port 1 View  Result Date: 07/24/2020 CLINICAL DATA:  Shortness of breath EXAM: PORTABLE CHEST 1 VIEW COMPARISON:  12/16/2013 rib/chest radiographs and prior. FINDINGS: Telemetry wires overlie the chest. No focal consolidation, pneumothorax or pleural effusion. Cardiomediastinal silhouette within normal limits. Right acromioclavicular osteoarthrosis. IMPRESSION: No focal airspace disease. Electronically Signed   By: Primitivo Gauze M.D.   On: 07/24/2020 08:28     Microbiology: Recent Results (from the past 240  hour(s))  Respiratory Panel by RT PCR (Flu A&B, Covid) - Nasopharyngeal Swab     Status: Abnormal   Collection Time: 07/24/20  8:09 AM   Specimen: Nasopharyngeal Swab  Result Value Ref Range Status   SARS Coronavirus 2 by RT PCR POSITIVE (A) NEGATIVE Final    Comment: RESULT CALLED TO, READ BACK BY AND VERIFIED WITH: FARMER E. @ 1052 ON [id number] BY HENDERSON L. (NOTE) SARS-CoV-2 target nucleic acids are DETECTED.  SARS-CoV-2 RNA is generally detectable in upper respiratory specimens  during the acute phase of infection. Positive results are indicative of the presence of the identified virus, but do not rule out bacterial infection or co-infection with other pathogens not detected by the test. Clinical correlation with patient history and other diagnostic information is necessary to determine patient infection status. The expected result is Negative.  Fact Sheet for Patients:  PinkCheek.be  Fact Sheet for Healthcare Providers: GravelBags.it  This test is not yet approved or cleared by the [place] FDA and  has been authorized for detection and/or diagnosis of SARS-CoV-2 by FDA under an Emergency Use Authorization (EUA).  This EUA will remain in effect (meaning this test  can be used) for the duration of  the COVID-19 declaration under Section 564(b)(1) of the Act, 21 U.S.C. section 360bbb-3(b)(1), unless the authorization is terminated or revoked sooner.      Influenza A by PCR NEGATIVE NEGATIVE Final   Influenza B by PCR NEGATIVE NEGATIVE Final    Comment: (NOTE) The Xpert Xpress SARS-CoV-2/FLU/RSV assay is intended as an aid in  the diagnosis of influenza from Nasopharyngeal swab specimens and  should not be used as a sole basis for treatment. Nasal washings and  aspirates are unacceptable for Xpert Xpress SARS-CoV-2/FLU/RSV  testing.  Fact Sheet for Patients: PinkCheek.be  Fact  Sheet for Healthcare Providers: GravelBags.it  This test is not yet approved or cleared by the [place] FDA and  has been authorized for detection and/or diagnosis of SARS-CoV-2 by  FDA under an Emergency Use Authorization (EUA). This EUA will remain  in effect (meaning this test can be used) for the duration of the  Covid-19 declaration under Section 564(b)(1) of the Act, 21  U.S.C. section 360bbb-3(b)(1), unless the authorization is  terminated or revoked. Performed at [facility], [address], [place], [place] [address]   MRSA PCR Screening     Status: None   Collection Time: 07/25/20  2:22 AM   Specimen: Nasal Mucosa; Nasopharyngeal  Result Value Ref Range Status   MRSA by PCR NEGATIVE NEGATIVE Final    Comment:        The GeneXpert MRSA Assay (FDA approved for NASAL specimens only), is one component of a comprehensive MRSA colonization surveillance program. It is not intended to diagnose MRSA infection nor to guide or monitor treatment for MRSA infections. Performed at [facility], [address], [place], [place] [address]      Labs: Basic Metabolic Panel: Recent Labs  Lab 07/24/20 0805 07/24/20 0805 07/25/20 2094 07/25/20 0622 07/26/20 0728 07/27/20 0711  NA 129*  --  131*  --  135 136  K 3.2*   < > 3.7   < > 4.1 3.9  CL 91*  --  96*  --  102 102  CO2 26  --  27  --  27 27  GLUCOSE 81  --  78  --  102* 104*  BUN 13  --  11  --  8 6*  CREATININE 0.69  --  0.72  --  0.54* 0.53*  CALCIUM 7.4*  --  7.8*  --  8.0* 8.4*  MG  --   --  2.0  --  2.2 2.1  PHOS  --   --   --   --   --  1.7*   < > = values in this interval not displayed.   Liver Function Tests: Recent Labs  Lab 07/24/20 0805 07/25/20 0622 07/26/20 0728 07/27/20 0711  AST 21 17 19 20   ALT 13 12 12 14   ALKPHOS 41 38 41 49  BILITOT 1.7* 1.6* 1.3*  1.1 1.0  PROT 6.3* 5.4* 5.8* 6.1*  ALBUMIN 3.3* 2.9* 3.1* 3.2*   Recent Labs  Lab 07/24/20 0805  07/25/20 0622 07/26/20 0728  LIPASE 148* 129* 126*   No results for input(s): AMMONIA in the last 168 hours. CBC: Recent Labs  Lab 07/24/20 0805 07/25/20 0622 07/26/20 0728 07/27/20 0711  WBC 2.5* 2.2* 2.5* 4.1  NEUTROABS 1.4* 1.3* 1.2* 2.9  HGB 16.0 13.3 14.7 15.3  HCT 45.6 41.2 43.8 45.9  MCV 90.7 95.4 94.0 93.3  PLT 84* 82* 101* 136*   Cardiac Enzymes: No results for input(s): CKTOTAL, CKMB, CKMBINDEX, TROPONINI in the last 168 hours. BNP: Invalid input(s): POCBNP CBG: No results for input(s): GLUCAP in the last 168 hours.  Time coordinating discharge:  36 minutes  Signed:  Orson Eva, DO [facility] Pager: [phone] 07/27/2020, 12:25 PM

## 2020-07-27 NOTE — Discharge Instructions (Signed)
Patient scheduled for outpatient Remdesivir infusions at 10am on Tuesday 10/26 at [facility]. Please inform the patient to park at [place], as staff will be escorting the patient through the [place] entrance of the hospital. Appointments take approximately 45 minutes.    There is a wave flag banner located near the entrance on [address]. Turn into this entrance and immediately turn left or right and park in 1 of the 10 designated Covid Infusion Parking spots. There is a phone number on the sign, please call and let the staff know what spot you are in and we will come out and get you. For questions call [phone].  Thanks.

## 2020-07-27 NOTE — TOC Transition Note (Signed)
Transition of Care [facility]) - CM/SW Discharge Note   Patient Details  Name: Bruce Goodman MRN: [id number] Date of Birth: Nov 18, 1955  [facility] CM/SW Contact:  Natasha Bence, LCSW Phone Number: 07/27/2020, 10:59 AM   Clinical Narrative:    CSW notified of patient's discharge pending of Remdesivir out patient treatment. [facility] agreeable to transport patient to Remdesivir treatments at [facility]. CSW contacted Remdesivir reps. Reps scheduled Remdesivir treatment for 10:00 am. 07/27/20. CSW notified nurse with [facility]. Nurse agreeable to day and time. TOC signing off.          Patient Goals and CMS Choice        Discharge Placement                       Discharge Plan and Services                                     Social Determinants of Health (SDOH) Interventions     Readmission Risk Interventions No flowsheet data found.

## 2020-07-27 NOTE — Progress Notes (Signed)
Patient scheduled for outpatient Remdesivir infusions at 10am on Tuesday 10/26 at [facility]. Please inform the patient to park at [place], as staff will be escorting the patient through the [place] entrance of the hospital. Appointments take approximately 45 minutes.    There is a wave flag banner located near the entrance on [address]. Turn into this entrance and immediately turn left or right and park in 1 of the 10 designated Covid Infusion Parking spots. There is a phone number on the sign, please call and let the staff know what spot you are in and we will come out and get you. For questions call [phone].  Thanks.

## 2020-07-28 ENCOUNTER — Ambulatory Visit (HOSPITAL_COMMUNITY)
Admit: 2020-07-28 | Discharge: 2020-07-28 | Disposition: A | Discharge status: Home / Self Care | Source: Ambulatory Visit | Attending: Pulmonary Disease | Admitting: Pulmonary Disease

## 2020-07-28 DIAGNOSIS — U071 COVID-19: Secondary | ICD-10-CM | POA: Insufficient documentation

## 2020-07-28 MED ORDER — SODIUM CHLORIDE 0.9 % IV SOLN
100.0000 mg | Freq: Once | INTRAVENOUS | Status: AC
  Administered 2020-07-28: 100 mg via INTRAVENOUS

## 2020-07-28 MED ORDER — SODIUM CHLORIDE 0.9 % IV SOLN: INTRAVENOUS | Status: DC | PRN

## 2020-07-28 MED ORDER — FAMOTIDINE IN NACL 20-0.9 MG/50ML-% IV SOLN: 20.0000 mg | Freq: Once | INTRAVENOUS | Status: DC | PRN

## 2020-07-28 MED ORDER — EPINEPHRINE 0.3 MG/0.3ML IJ SOAJ: 0.3000 mg | Freq: Once | INTRAMUSCULAR | Status: DC | PRN

## 2020-07-28 MED ORDER — ALBUTEROL SULFATE HFA 108 (90 BASE) MCG/ACT IN AERS: 2.0000 | INHALATION_SPRAY | Freq: Once | RESPIRATORY_TRACT | Status: DC | PRN

## 2020-07-28 MED ORDER — METHYLPREDNISOLONE SODIUM SUCC 125 MG IJ SOLR: 125.0000 mg | Freq: Once | INTRAMUSCULAR | Status: DC | PRN

## 2020-07-28 MED ORDER — DIPHENHYDRAMINE HCL 50 MG/ML IJ SOLN: 50.0000 mg | Freq: Once | INTRAMUSCULAR | Status: DC | PRN

## 2020-07-28 NOTE — Progress Notes (Signed)
  Diagnosis: COVID-19  Physician:Dr Joya Gaskins   Procedure: Covid Infusion Clinic Med: remdesivir infusion - Provided patient with remdesivir fact sheet for patients, parents and caregivers prior to infusion.  Complications: No immediate complications noted.  Discharge: Discharged home   [name] 07/28/2020

## 2020-07-28 NOTE — Discharge Instructions (Signed)
10 Things You Can Do to Manage Your COVID-19 Symptoms at Home If you have possible or confirmed COVID-19: 1. Stay home from work and school. And stay away from other public places. If you must go out, avoid using any kind of public transportation, ridesharing, or taxis. 2. Monitor your symptoms carefully. If your symptoms get worse, call your healthcare provider immediately. 3. Get rest and stay hydrated. 4. If you have a medical appointment, call the healthcare provider ahead of time and tell them that you have or may have COVID-19. 5. For medical emergencies, call 911 and notify the dispatch personnel that you have or may have COVID-19. 6. Cover your cough and sneezes with a tissue or use the inside of your elbow. 7. Wash your hands often with soap and water for at least 20 seconds or clean your hands with an alcohol-based hand sanitizer that contains at least 60% alcohol. 8. As much as possible, stay in a specific room and away from other people in your home. Also, you should use a separate bathroom, if available. If you need to be around other people in or outside of the home, wear a mask. 9. Avoid sharing personal items with other people in your household, like dishes, towels, and bedding. 10. Clean all surfaces that are touched often, like counters, tabletops, and doorknobs. Use household cleaning sprays or wipes according to the label instructions. cdc.gov/coronavirus 04/03/2019 This information is not intended to replace advice given to you by your health care provider. Make sure you discuss any questions you have with your health care provider. Document Revised: 09/05/2019 Document Reviewed: 09/05/2019 Elsevier Patient Education  2020 Elsevier Inc.  

## 2022-03-18 IMAGING — CT CT ABD-PELV W/ CM
2 of 5 series · 16 of 46 positions shown, 18 images · IV contrast (Omnipaque or Isovue)
Comparison: Ultrasound 07/24/2020, CT 02/12/2016

CLINICAL DATA: Epigastric pain.  Pancreatitis.

EXAM:
CT ABDOMEN AND PELVIS WITH CONTRAST
TECHNIQUE: Multidetector CT imaging of the abdomen and pelvis was performed
using the standard protocol following bolus administration of
intravenous contrast.
CONTRAST:  80mL OMNIPAQUE IOHEXOL 300 MG/ML  SOLN

[Series 2: axial st · axial · 0.76mm/px · z∈[+809,+1259]mm · 13 of 101 slices shown, 15 images]
[im 6/101  soft-tissue]
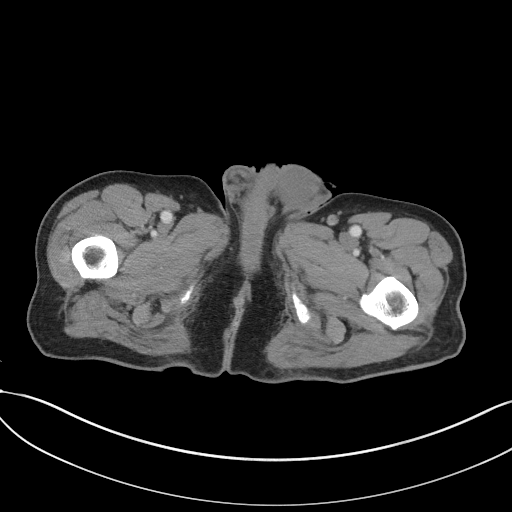
[im 6/101  bone]
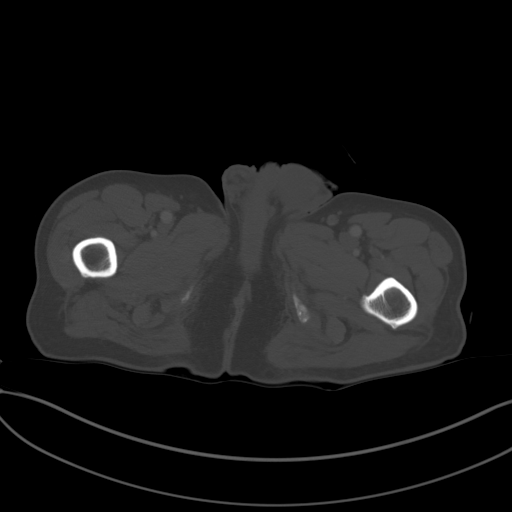
[im 16/101  soft-tissue]
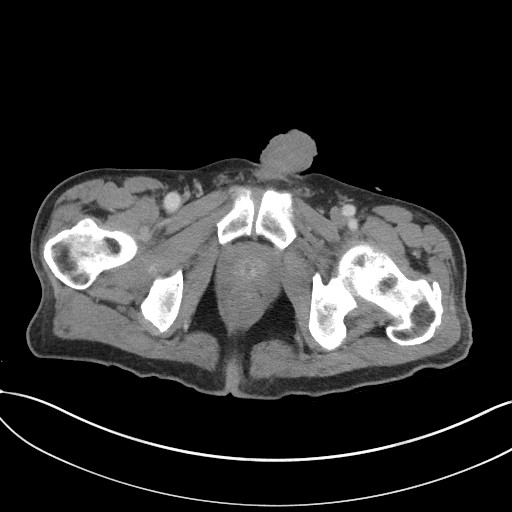
[im 21/101  soft-tissue]
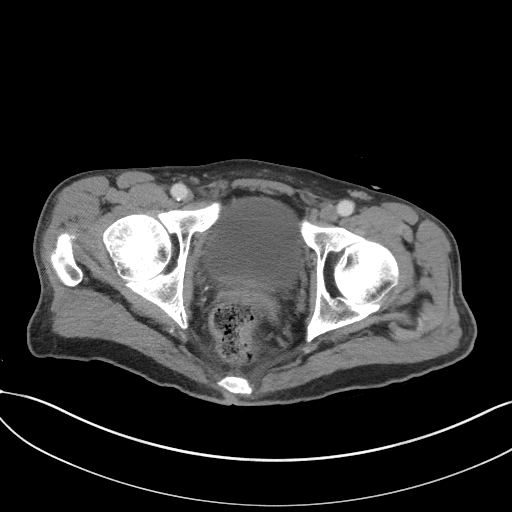
[im 31/101  soft-tissue]
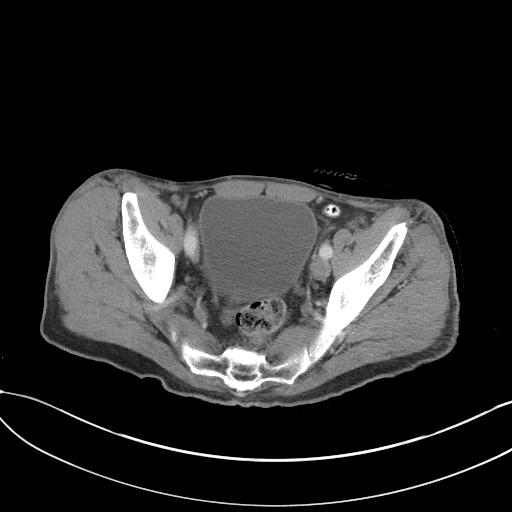
[im 36/101  soft-tissue]
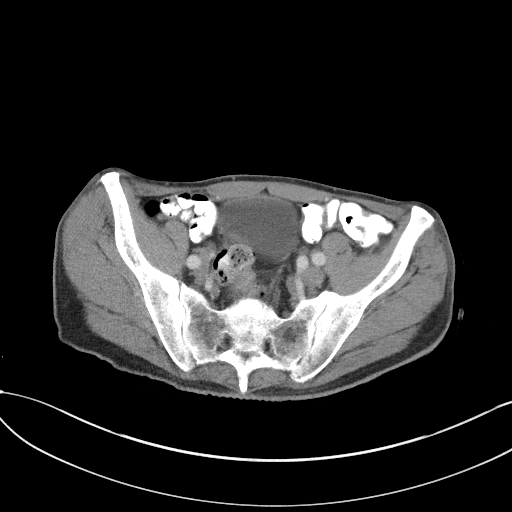
[im 46/101  soft-tissue]
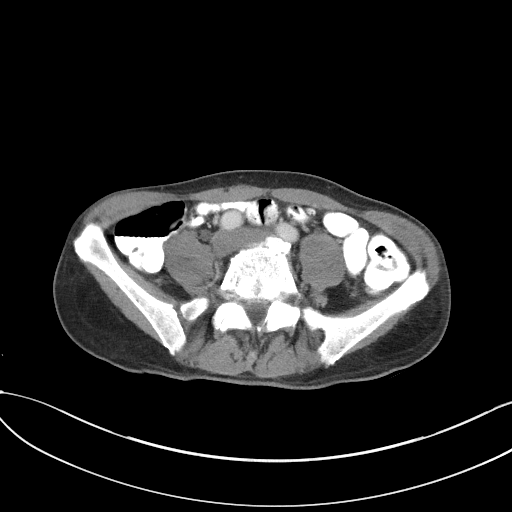
[im 51/101  soft-tissue]
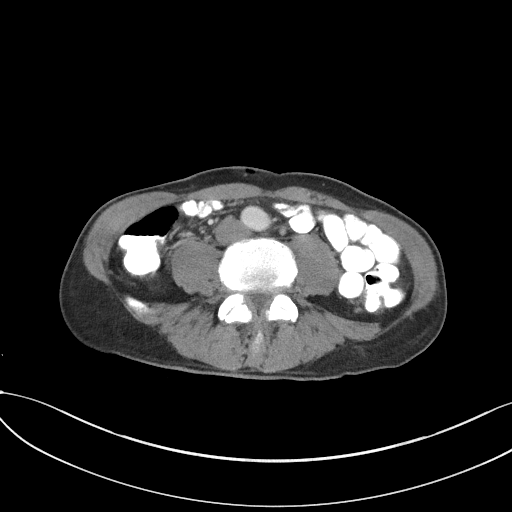
[im 56/101  soft-tissue]
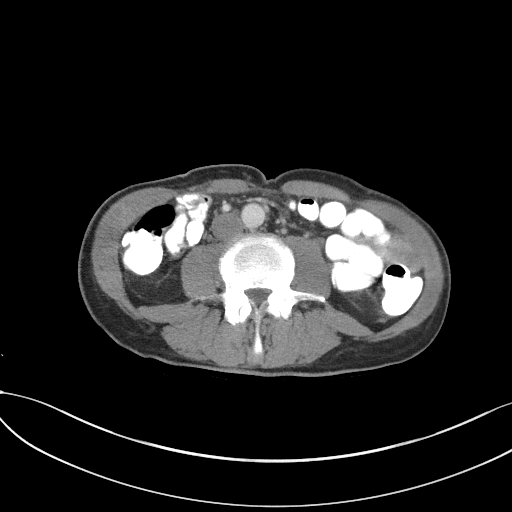
[im 66/101  soft-tissue]
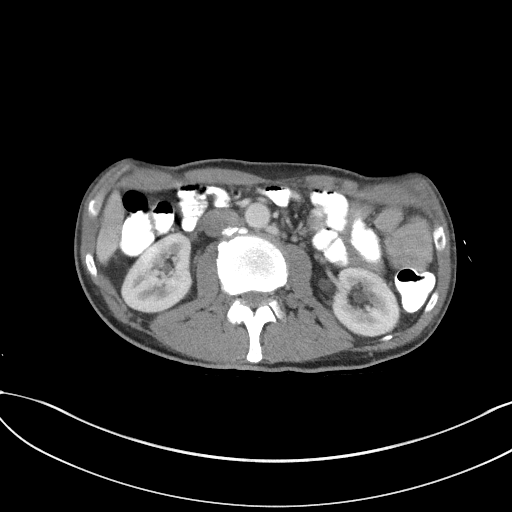
[im 66/101  bone]
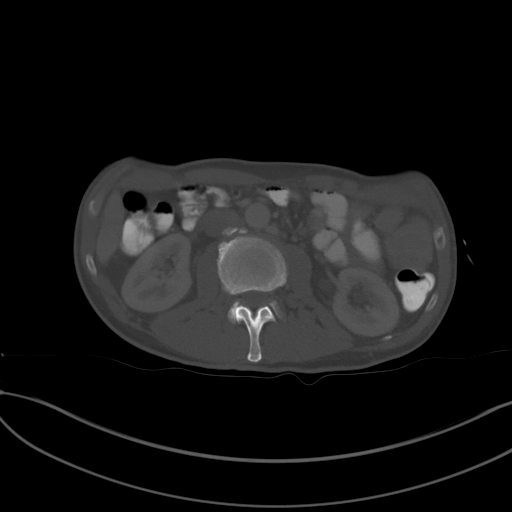
[im 71/101  soft-tissue]
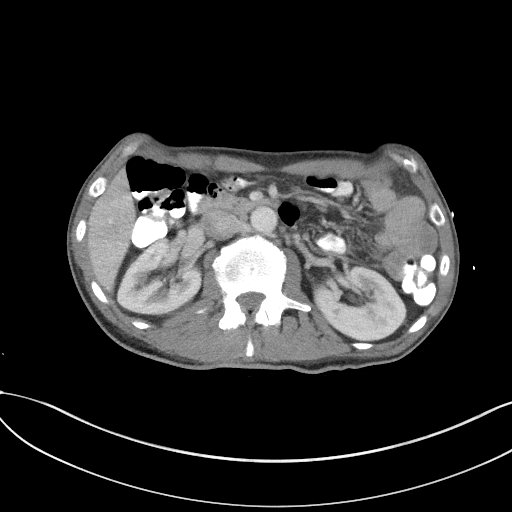
[im 81/101  soft-tissue]
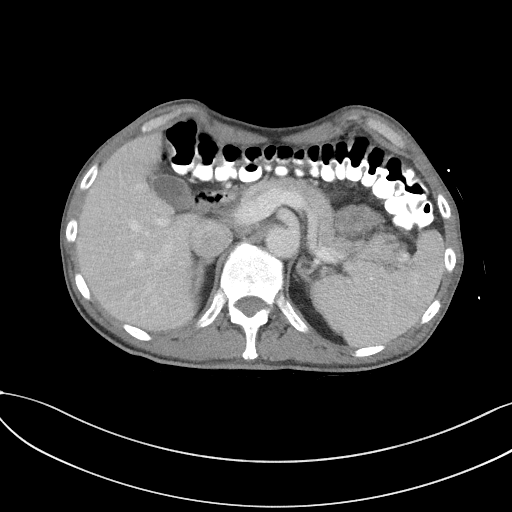
[im 86/101  soft-tissue]
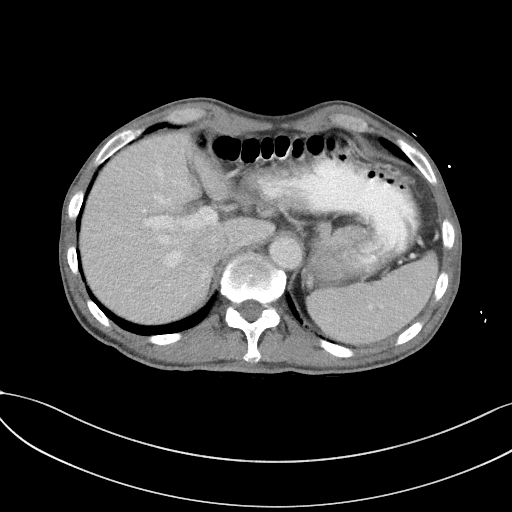
[im 96/101  soft-tissue]
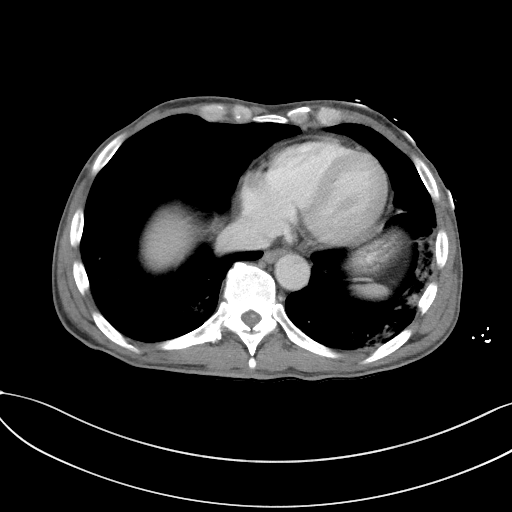

[Series 5: coronal st · coronal · 0.69mm/px · 3 of 83 slices shown]
[im 28/83  soft-tissue]
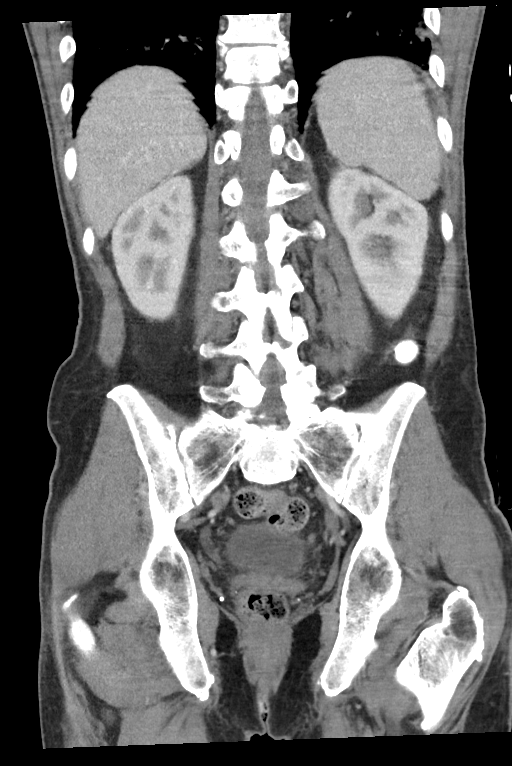
[im 37/83  soft-tissue]
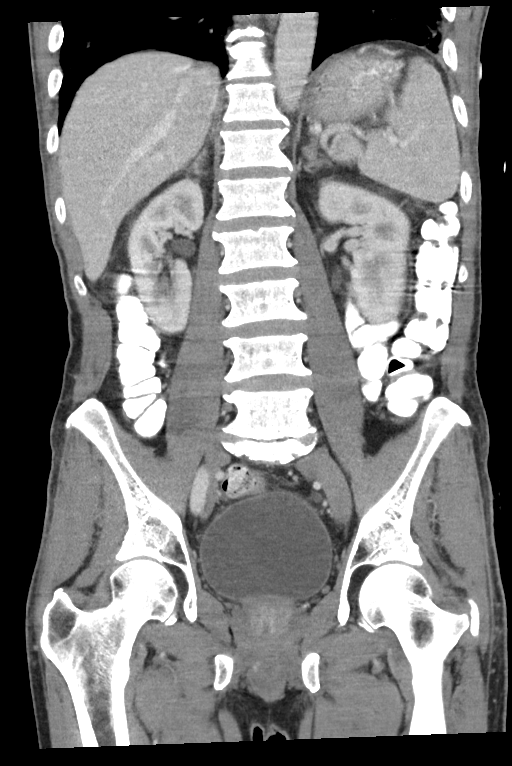
[im 46/83  soft-tissue]
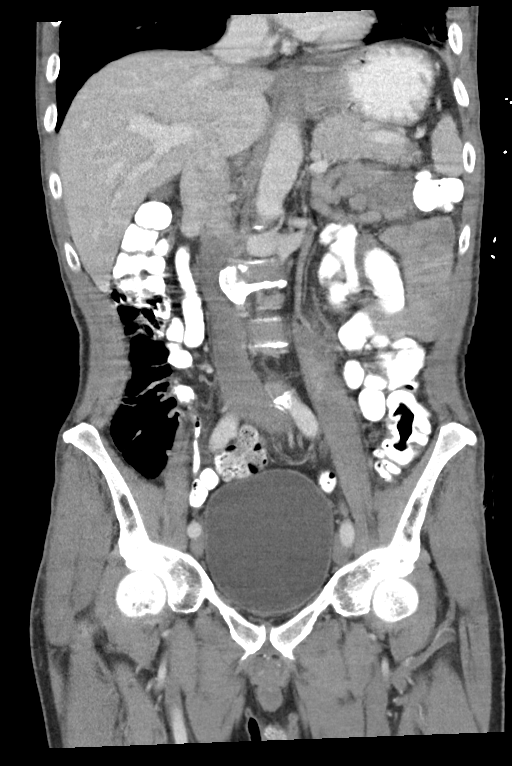

[16 of 46 positions shown; findings below may reference images not displayed]

FINDINGS: Lower chest: Rounded ground-glass nodules at the lung bases.

Hepatobiliary: No focal hepatic lesion. No biliary duct dilatation.
Common bile duct is normal.

Pancreas: Pancreas is normal. No ductal dilatation. No pancreatic
inflammation.

Spleen: Normal spleen

Adrenals/urinary tract: Adrenal glands and kidneys are normal. The
ureters and bladder normal.

Stomach/Bowel: Stomach, small bowel, appendix, and cecum are normal.
The colon and rectosigmoid colon are normal.

Vascular/Lymphatic: Abdominal aorta is normal caliber. No periportal
or retroperitoneal adenopathy. No pelvic adenopathy.

Reproductive: Prostate normal

Other: No free fluid.

Musculoskeletal: No aggressive osseous lesion.
IMPRESSION: 1. Ground-glass nodules at the lung bases concern for early COVID
viral pneumonia.
2. Normal gallbladder and pancreas by CT imaging.
